# Patient Record
Sex: Female | Born: 1935
Health system: Southern US, Community
[De-identification: ages and names within clinical notes are randomized; demographics above are authoritative.]

## PROBLEM LIST (undated history)

## (undated) DIAGNOSIS — I10 Essential (primary) hypertension: Secondary | ICD-10-CM

## (undated) DIAGNOSIS — M1990 Unspecified osteoarthritis, unspecified site: Secondary | ICD-10-CM

## (undated) DIAGNOSIS — R0600 Dyspnea, unspecified: Secondary | ICD-10-CM

## (undated) DIAGNOSIS — R933 Abnormal findings on diagnostic imaging of other parts of digestive tract: Secondary | ICD-10-CM

## (undated) DIAGNOSIS — K219 Gastro-esophageal reflux disease without esophagitis: Secondary | ICD-10-CM

## (undated) DIAGNOSIS — R1310 Dysphagia, unspecified: Secondary | ICD-10-CM

## (undated) DIAGNOSIS — N390 Urinary tract infection, site not specified: Secondary | ICD-10-CM

## (undated) DIAGNOSIS — E559 Vitamin D deficiency, unspecified: Secondary | ICD-10-CM

## (undated) DIAGNOSIS — D649 Anemia, unspecified: Secondary | ICD-10-CM

## (undated) DIAGNOSIS — R748 Abnormal levels of other serum enzymes: Secondary | ICD-10-CM

## (undated) DIAGNOSIS — R112 Nausea with vomiting, unspecified: Secondary | ICD-10-CM

## (undated) DIAGNOSIS — Z9889 Other specified postprocedural states: Secondary | ICD-10-CM

## (undated) DIAGNOSIS — C801 Malignant (primary) neoplasm, unspecified: Secondary | ICD-10-CM

## (undated) DIAGNOSIS — R1319 Other dysphagia: Secondary | ICD-10-CM

## (undated) DIAGNOSIS — I2699 Other pulmonary embolism without acute cor pulmonale: Secondary | ICD-10-CM

## (undated) DIAGNOSIS — K222 Esophageal obstruction: Secondary | ICD-10-CM

## (undated) DIAGNOSIS — E041 Nontoxic single thyroid nodule: Secondary | ICD-10-CM

## (undated) DIAGNOSIS — K5790 Diverticulosis of intestine, part unspecified, without perforation or abscess without bleeding: Secondary | ICD-10-CM

## (undated) DIAGNOSIS — I868 Varicose veins of other specified sites: Secondary | ICD-10-CM

## (undated) DIAGNOSIS — I8390 Asymptomatic varicose veins of unspecified lower extremity: Secondary | ICD-10-CM

## (undated) HISTORY — DX: Abnormal levels of other serum enzymes: R74.8

## (undated) HISTORY — DX: Gastro-esophageal reflux disease without esophagitis: K21.9

## (undated) HISTORY — DX: Vitamin D deficiency, unspecified: E55.9

## (undated) HISTORY — DX: Essential (primary) hypertension: I10

## (undated) HISTORY — PX: ERCP: SHX60

## (undated) HISTORY — DX: Nontoxic single thyroid nodule: E04.1

## (undated) HISTORY — DX: Asymptomatic varicose veins of unspecified lower extremity: I83.90

## (undated) HISTORY — PX: TUBAL LIGATION: SHX77

## (undated) HISTORY — DX: Varicose veins of other specified sites: I86.8

## (undated) HISTORY — DX: Hypocalcemia: E83.51

## (undated) HISTORY — DX: Diverticulosis of intestine, part unspecified, without perforation or abscess without bleeding: K57.90

## (undated) HISTORY — DX: Other dysphagia: R13.19

## (undated) HISTORY — DX: Malignant (primary) neoplasm, unspecified: C80.1

## (undated) HISTORY — DX: Esophageal obstruction: K22.2

## (undated) HISTORY — PX: CATARACT EXTRACTION: SUR2

## (undated) HISTORY — PX: ABDOMINAL HYSTERECTOMY: SHX81

## (undated) HISTORY — DX: Dysphagia, unspecified: R13.10

## (undated) HISTORY — DX: Anemia, unspecified: D64.9

## (undated) HISTORY — PX: CHOLECYSTECTOMY: SHX55

## (undated) HISTORY — PX: KNEE ARTHROSCOPY: SUR90

## (undated) HISTORY — DX: Abnormal findings on diagnostic imaging of other parts of digestive tract: R93.3

---

## 1998-06-05 ENCOUNTER — Ambulatory Visit (HOSPITAL_COMMUNITY): Admission: RE | Admit: 1998-06-05 | Discharge: 1998-06-05 | Payer: Self-pay | Admitting: Pulmonary Disease

## 1998-06-05 ENCOUNTER — Encounter: Payer: Self-pay | Admitting: Pulmonary Disease

## 1998-06-27 ENCOUNTER — Ambulatory Visit: Admission: RE | Admit: 1998-06-27 | Discharge: 1998-06-27 | Payer: Self-pay | Admitting: Pulmonary Disease

## 1998-08-13 ENCOUNTER — Encounter: Payer: Self-pay | Admitting: Pulmonary Disease

## 1998-08-13 ENCOUNTER — Ambulatory Visit (HOSPITAL_COMMUNITY): Admission: RE | Admit: 1998-08-13 | Discharge: 1998-08-13 | Payer: Self-pay | Admitting: Pulmonary Disease

## 2002-02-20 ENCOUNTER — Ambulatory Visit (HOSPITAL_COMMUNITY): Admission: RE | Admit: 2002-02-20 | Discharge: 2002-02-20 | Payer: Self-pay | Admitting: Gastroenterology

## 2002-02-20 ENCOUNTER — Encounter: Payer: Self-pay | Admitting: Gastroenterology

## 2003-01-10 ENCOUNTER — Encounter: Payer: Self-pay | Admitting: Emergency Medicine

## 2003-01-11 ENCOUNTER — Encounter: Payer: Self-pay | Admitting: Emergency Medicine

## 2003-01-11 ENCOUNTER — Inpatient Hospital Stay (HOSPITAL_COMMUNITY): Admission: EM | Admit: 2003-01-11 | Discharge: 2003-01-13 | Payer: Self-pay | Admitting: Emergency Medicine

## 2003-01-11 ENCOUNTER — Encounter: Payer: Self-pay | Admitting: Internal Medicine

## 2003-01-12 ENCOUNTER — Encounter (INDEPENDENT_AMBULATORY_CARE_PROVIDER_SITE_OTHER): Payer: Self-pay | Admitting: Specialist

## 2003-01-12 ENCOUNTER — Encounter: Payer: Self-pay | Admitting: General Surgery

## 2003-02-26 ENCOUNTER — Ambulatory Visit: Admission: RE | Admit: 2003-02-26 | Discharge: 2003-02-26 | Payer: Self-pay | Admitting: Pulmonary Disease

## 2007-07-20 ENCOUNTER — Other Ambulatory Visit: Admission: RE | Admit: 2007-07-20 | Discharge: 2007-07-20 | Payer: Self-pay | Admitting: Family Medicine

## 2009-01-01 ENCOUNTER — Encounter (INDEPENDENT_AMBULATORY_CARE_PROVIDER_SITE_OTHER): Payer: Self-pay | Admitting: *Deleted

## 2010-09-04 NOTE — Consult Note (Signed)
[name], [name]                          ACCOUNT NO.:  [id number]   MEDICAL RECORD NO.:  [id number]                   PATIENT TYPE:  INP   LOCATION:  5025                                 FACILITY:  [facility]   PHYSICIAN:  Abigail Miyamoto, M.D.              DATE OF BIRTH:  Jan 30, 1935   DATE OF CONSULTATION:  01/11/2003  DATE OF DISCHARGE:                                   CONSULTATION   CHIEF COMPLAINT:  Common bile duct stones.   HISTORY:  This is a pleasant 76 year old female who was admitted on the  January 11, 2003 with abdominal pain and pain radiating through her back,  nausea and vomiting, and symptoms consistent with biliary colic.  She has  had similar attacks several years in the past and had __________ in the  folds in the gallbladder and ERCP, which was otherwise unremarkable last  year.  Secondary to her persistent pain and elevated bilirubin, at this  time, she was admitted again.  Ultrasound showed no stones in the bile duct  but questionable mild dilation of the biliary tree.  Her bilirubin was  elevated at 2.9.  She is otherwise healthy.  Has had no other complaints.   PAST MEDICAL HISTORY:  Is significant for hypertension, for which she is on  a hypertensive medication.   PAST SURGICAL HISTORY:  Includes hysterectomy and tubal ligation.   ALLERGIES:  She has allergies to CODEINE and SULFA.   SOCIAL HISTORY:  She does not smoke, does not drink alcohol.   REVIEW OF SYSTEMS:  Negative from a cardiopulmonary standpoint.  She is also  passing her urine well.   PHYSICAL EXAMINATION:  GENERAL:  Reveals an obese female in no acute  distress.  VITAL SIGNS:  Temperature is 100; pulse is 71; blood pressure is 142/69;  respiratory rate is 20.  GENERAL:  She is well in appearance other than mildly sedated after the  ERCP.  EYES:  She is anicteric.  Pupils are reactive bilaterally.  EARS, NOSE, MOUTH & THROAT:  External ears and nose are normal.  Hearing is  normal.  Oropharynx is clear.  NECK:  Supple.  There is no cervical adenopathy or thyromegaly.  LUNGS:  Clear to auscultation bilaterally with normal effort.  CARDIOVASCULAR:  Regular rate and rhythm with no peripheral edema, no  murmurs.  ABDOMEN:  Obese.  There is some tenderness in the epigastrium.  There is a  well-healed incision at the umbilicus.  There are no masses.  There is no  organomegaly.  There are no hernias.  EXTREMITIES:  Are warm and well perfused.   LABORATORY DATA:  From today, shows her to have a white blood count of 9.8,  hemoglobin 11.7; creatinine 0.8; bilirubin is down to 1.4; alkaline  phosphatase is elevated at 281; AST and ALT are elevated at 144 and 186;  amylase and lipase are normal.   ASSESSMENT/PLAN:  This is a 76 year old female with biliary colic and common  bile duct stones, status post endoscopic retrograde cholangiopancreatography  today and retraction of the stones with sphincterotomy.  At this  point, cholecystectomy is now warranted.  I discussed the laparoscopic  approach with the patient and her family.  We discussed the risks of the  surgery, including bleeding, infection, common bile duct injury, need for  open surgery, bile leak, etc., and, at this point, they wish to proceed.  Surgery will thus be planned for tomorrow.                                               Abigail Miyamoto, M.D.    DB/MEDQ  D:  01/11/2003  T:  01/13/2003  Job:  [id number]   cc:   Arlyce Dice, M.D.

## 2010-09-04 NOTE — Op Note (Signed)
NAMESUMMIT, BORCHARDT                          ACCOUNT NO.:  [id number]   MEDICAL RECORD NO.:  [id number]                   PATIENT TYPE:  OUT   LOCATION:  CARD                                 FACILITY:  [facility]   PHYSICIAN:  Oley Balm. Sung Amabile, M.D. [facility]          DATE OF BIRTH:  02-17-1935   DATE OF PROCEDURE:  02/26/2003  DATE OF DISCHARGE:  02/26/2003                                 OPERATIVE REPORT   PROCEDURE:  Cardiopulmonary stress test.   INDICATIONS:  Unexplained dyspnea.   DESCRIPTION OF PROCEDURE:  Cardiopulmonary stress testing was performed on a  graded treadmill.  A modified Bruce protocol was used.  Testing was stopped  due to dyspnea and heart rate goal after it was maximal.  At peak exercise  VO2 maximum was 1.31 L/min., or 78% of predicted, maximum indicating mild  exercise impairment.  When corrected for her body weight, VO2 over body  weight was 61% of predicted maximum, indicating a mild to moderate  impairment.   At peak exercise heart rate was 158 beats per minute or 101% of predicted  maximum, indicating that the cardiovascular limitation was reached.  Oxygen  pulse was normal, suggesting normal stroke volume.  Blood pressure response  revealed a mild hypertensive response to exercise.  EKG tracings revealed no  ischemia and no arrhythmias.   At peak exercise minute ventilation was 62.4 L or 90% of predicted maximum,  indicating that her ventilatory limitation was reached.  Gas exchange  parameters revealed borderline increased dead space ventilation.  Baseline  pulmonary function tests revealed mild restriction.  Post-exercise  spirometry was not done.   SUMMARY:  Mild to moderate exercise impairment due to a simultaneous  cardiovascular and ventilatory limitation without discrete evidence of  cardiac or pulmonary pathology.  However, there does appear to be a mild  hypertensive response to exercise and borderline increase in dead space  ventilation.  Most  likely cause of exercise limitation is deconditioning and  obesity.  However, would consider echocardiogram to rule out left  ventricular dysfunction or hypertrophy.                                               Oley Balm Sung Amabile, M.D. [facility]    DBS/MEDQ  D:  03/06/2003  T:  03/07/2003  Job:  [id number]   cc:   Cardiopulmonary Laboratory, [facility]

## 2010-09-04 NOTE — Op Note (Signed)
[name], [name]                          ACCOUNT NO.:  [id number]   MEDICAL RECORD NO.:  [id number]                   PATIENT TYPE:  INP   LOCATION:  5025                                 FACILITY:  [facility]   PHYSICIAN:  Ollen Gross. Vernell Morgans, M.D.              DATE OF BIRTH:  01-Dec-1934   DATE OF PROCEDURE:  DATE OF DISCHARGE:                                 OPERATIVE REPORT   PREOPERATIVE DIAGNOSIS:  Gallstones.   POSTOPERATIVE DIAGNOSIS:  Gallstones.   PROCEDURE:  Laparoscopic cholecystectomy with intraoperative cholangiogram.   SURGEON:  Ollen Gross. Carolynne Edouard, M.D.   ASSISTANT:  Adolph Pollack, M.D.   ANESTHESIA:  General endotracheal.   DESCRIPTION OF PROCEDURE:  After informed consent was obtained, the patient  was brought to the operating room and placed in the supine position on the  operating room table.  After adequate induction of general anesthesia, the  patient's abdomen was prepped with Betadine and draped in the usual sterile  manner.  The area above the umbilicus as infiltrated with 0.25% Marcaine and  a small incision was made with a 15 blade knife.  This incision was carried  down through the subcutaneous tissue bluntly with a Kelly clamp and [employer]-  [employer] retractors until the linea alba was identified.  The linea alba was  incised with a 15 blade knife and each side was grasped with Kocher clamps.  The preperitoneal space was then elevated anteriorly.  The preperitoneal  space was then probed bluntly with a hemostat until the peritoneum was  opened and access was gained to the abdominal cavity.  A 0 Vicryl  pursestring stitch was then placed in the fascia surrounding the opening.  A  Hasson cannula was placed through the opening with the previously-placed  Vicryl pursestring stitch.  The abdomen was then insufflated with carbon  dioxide without difficulty.  The patient was placed in a head-up position  and the laparoscope was placed through the Hasson cannula and  the right  upper quadrant was inspected.  The liver and dome of the gallbladder were  readily identified.  The epigastric region was then infiltrated with 0.25%  Marcaine and a small incision was made with a 15 blade knife, and a 10 mm  port was placed bluntly through this incision into the abdominal cavity  under direct vision.  Sites were then chosen laterally on the right side of  the abdomen for placement of 5 mm ports.  Each of these areas was  infiltrated with 0.25% Marcaine.  Small stab incisions were made with a 15  blade knife.  Five millimeters were then placed bluntly through these  incisions into the abdominal cavity under direct vision.  A blunt grasper  was placed through the lateralmost 5 mm port and used to grasp the dome of  the gallbladder and elevate it anteriorly and superiorly.  Another blunt  grasper was placed  through the other 5 mm port and used to retract on the  body and neck of the gallbladder.  A dissector was placed through the  epigastric port and using the combination of electrocautery and blunt  dissection, some adhesions to the gallbladder body were taken down.  The  peritoneal reflection at the gallbladder neck was then opened with the  electrocautery.  Blunt dissection was then carried out in this area until  the gallbladder neck-cystic duct junction was readily identified and a good  window was identified.  A clip was placed on the gallbladder neck.  A small  ductotomy was made.  A 14-gauge Angiocath was placed percutaneously through  the anterior abdominal wall and a Reddick cholangiogram catheter was placed  through the Angiocath and flushed.  The Reddick catheter was then placed  within the cystic duct and anchored in place with a clip.  A cholangiogram  was obtained that showed dilated ducts but no filling defects and good  emptying into the duodenum.  There was adequate length on the cystic duct.  The anchoring clip and catheters then were removed  from the patient, three  clips were placed proximally on the cystic duct, and the duct was tied  between the two sets of clips.  Posterior to this the cystic artery with a  branch was identified and again dissected in a circumferential manner  bluntly until a good window was created.  Two clips were placed proximally  and one distally on each of the arteries, and the arteries were divided  between the two.  Next a laparoscopic hook cautery device was used to  separate the gallbladder from the liver bed.  Prior to completely detaching  the gallbladder from the liver bed, the liver bed was inspected and several  small bleeding points were coagulated with the electrocautery.  The  gallbladder was then detached the rest of the way from the liver bed without  difficulty.  The laparoscope was then moved to the upper midline port and a  gallbladder grasper was placed through the Hasson cannula.  The gallbladder  neck was grasped and the gallbladder was removed with the Hasson cannula  through the supraumbilical port without difficulty.  The fascial defect was  closed with the previously-placed Vicryl pursestring stitch.  The abdomen  was then irrigated with copious amounts of saline until the effluent was  clear.  The liver bed was inspected and again found to be hemostatic.  The  ports were then all removed under direct vision and found to be hemostatic.  Gas was allowed to escape.  The skin incisions were then all closed with  interrupted 4-0 Monocryl subcuticular stitches, Benzoin and Steri-Strips  were applied.  The patient tolerated the procedure well.  At the end of the  case all needle, sponge, and instrument counts were correct.  The patient  was then awakened and taken to the recovery room in stable condition.                                               Ollen Gross. Vernell Morgans, M.D.    PST/MEDQ  D:  01/12/2003  T:  01/14/2003  Job:  [id number]
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
## 2010-10-13 ENCOUNTER — Encounter: Payer: Self-pay | Admitting: Vascular Surgery

## 2011-04-27 DIAGNOSIS — L570 Actinic keratosis: Secondary | ICD-10-CM | POA: Insufficient documentation

## 2011-04-27 DIAGNOSIS — Z85820 Personal history of malignant melanoma of skin: Secondary | ICD-10-CM

## 2011-04-27 HISTORY — DX: Actinic keratosis: L57.0

## 2011-04-27 HISTORY — DX: Personal history of malignant melanoma of skin: Z85.820

## 2011-06-15 ENCOUNTER — Encounter: Payer: Self-pay | Admitting: *Deleted

## 2011-06-15 ENCOUNTER — Encounter: Payer: Self-pay | Admitting: Cardiothoracic Surgery

## 2011-06-15 ENCOUNTER — Other Ambulatory Visit: Payer: Self-pay | Admitting: Family Medicine

## 2011-06-15 ENCOUNTER — Ambulatory Visit
Admission: RE | Admit: 2011-06-15 | Discharge: 2011-06-15 | Disposition: A | Discharge status: Home / Self Care | Payer: Medicare Other | Source: Ambulatory Visit | Attending: Family Medicine | Admitting: Family Medicine

## 2011-06-15 ENCOUNTER — Institutional Professional Consult (permissible substitution) (INDEPENDENT_AMBULATORY_CARE_PROVIDER_SITE_OTHER): Payer: Medicare Other | Admitting: Cardiothoracic Surgery

## 2011-06-15 VITALS — BP 124/76 | HR 92 | Resp 16 | Ht 62.0 in | Wt 198.0 lb

## 2011-06-15 DIAGNOSIS — K219 Gastro-esophageal reflux disease without esophagitis: Secondary | ICD-10-CM | POA: Insufficient documentation

## 2011-06-15 DIAGNOSIS — E559 Vitamin D deficiency, unspecified: Secondary | ICD-10-CM | POA: Insufficient documentation

## 2011-06-15 DIAGNOSIS — J942 Hemothorax: Secondary | ICD-10-CM

## 2011-06-15 DIAGNOSIS — S2249XA Multiple fractures of ribs, unspecified side, initial encounter for closed fracture: Secondary | ICD-10-CM

## 2011-06-15 DIAGNOSIS — S2242XA Multiple fractures of ribs, left side, initial encounter for closed fracture: Secondary | ICD-10-CM

## 2011-06-15 DIAGNOSIS — C801 Malignant (primary) neoplasm, unspecified: Secondary | ICD-10-CM | POA: Insufficient documentation

## 2011-06-15 DIAGNOSIS — J90 Pleural effusion, not elsewhere classified: Secondary | ICD-10-CM

## 2011-06-15 DIAGNOSIS — I868 Varicose veins of other specified sites: Secondary | ICD-10-CM | POA: Insufficient documentation

## 2011-06-15 DIAGNOSIS — I10 Essential (primary) hypertension: Secondary | ICD-10-CM | POA: Insufficient documentation

## 2011-06-15 DIAGNOSIS — I8390 Asymptomatic varicose veins of unspecified lower extremity: Secondary | ICD-10-CM | POA: Insufficient documentation
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
## 2011-06-15 NOTE — Patient Instructions (Signed)
Inspiration maneuvers hourly No driving or lifting Take temp each day 4pm Call for temp>101 or short of breath

## 2011-06-18 DIAGNOSIS — I2699 Other pulmonary embolism without acute cor pulmonale: Secondary | ICD-10-CM

## 2011-06-18 DIAGNOSIS — Z86711 Personal history of pulmonary embolism: Secondary | ICD-10-CM

## 2011-06-18 HISTORY — DX: Personal history of pulmonary embolism: Z86.711

## 2011-06-18 HISTORY — DX: Other pulmonary embolism without acute cor pulmonale: I26.99

## 2011-06-20 ENCOUNTER — Other Ambulatory Visit: Payer: Self-pay | Admitting: Cardiothoracic Surgery

## 2011-06-22 ENCOUNTER — Other Ambulatory Visit: Payer: Self-pay | Admitting: Cardiothoracic Surgery

## 2011-06-22 DIAGNOSIS — S2249XA Multiple fractures of ribs, unspecified side, initial encounter for closed fracture: Secondary | ICD-10-CM

## 2011-06-23 ENCOUNTER — Encounter (HOSPITAL_COMMUNITY): Payer: Self-pay | Admitting: Emergency Medicine

## 2011-06-23 ENCOUNTER — Other Ambulatory Visit: Payer: Self-pay

## 2011-06-23 ENCOUNTER — Encounter: Payer: Self-pay | Admitting: Cardiothoracic Surgery

## 2011-06-23 ENCOUNTER — Ambulatory Visit (INDEPENDENT_AMBULATORY_CARE_PROVIDER_SITE_OTHER): Payer: Medicare Other | Admitting: Cardiothoracic Surgery

## 2011-06-23 ENCOUNTER — Inpatient Hospital Stay (HOSPITAL_COMMUNITY)
Admission: EM | Admit: 2011-06-23 | Discharge: 2011-06-29 | DRG: 176 | Disposition: A | Discharge status: Home / Self Care | Payer: Medicare Other | Source: Ambulatory Visit | Attending: Internal Medicine | Admitting: Internal Medicine

## 2011-06-23 ENCOUNTER — Ambulatory Visit
Admission: RE | Admit: 2011-06-23 | Discharge: 2011-06-23 | Disposition: A | Discharge status: Home / Self Care | Payer: Medicare Other | Source: Ambulatory Visit | Attending: Cardiothoracic Surgery | Admitting: Cardiothoracic Surgery

## 2011-06-23 ENCOUNTER — Ambulatory Visit (HOSPITAL_COMMUNITY)
Admission: RE | Admit: 2011-06-23 | Discharge: 2011-06-23 | Disposition: A | Discharge status: Home / Self Care | Payer: Medicare Other | Source: Ambulatory Visit | Attending: Cardiothoracic Surgery | Admitting: Cardiothoracic Surgery

## 2011-06-23 VITALS — BP 135/77 | HR 87 | Resp 20 | Ht 62.0 in | Wt 200.0 lb

## 2011-06-23 DIAGNOSIS — R0902 Hypoxemia: Secondary | ICD-10-CM | POA: Diagnosis present

## 2011-06-23 DIAGNOSIS — R0602 Shortness of breath: Secondary | ICD-10-CM

## 2011-06-23 DIAGNOSIS — C801 Malignant (primary) neoplasm, unspecified: Secondary | ICD-10-CM

## 2011-06-23 DIAGNOSIS — Z882 Allergy status to sulfonamides status: Secondary | ICD-10-CM

## 2011-06-23 DIAGNOSIS — S2239XA Fracture of one rib, unspecified side, initial encounter for closed fracture: Secondary | ICD-10-CM

## 2011-06-23 DIAGNOSIS — I8390 Asymptomatic varicose veins of unspecified lower extremity: Secondary | ICD-10-CM

## 2011-06-23 DIAGNOSIS — I2699 Other pulmonary embolism without acute cor pulmonale: Principal | ICD-10-CM | POA: Diagnosis present

## 2011-06-23 DIAGNOSIS — K219 Gastro-esophageal reflux disease without esophagitis: Secondary | ICD-10-CM | POA: Diagnosis present

## 2011-06-23 DIAGNOSIS — Z86711 Personal history of pulmonary embolism: Secondary | ICD-10-CM | POA: Diagnosis present

## 2011-06-23 DIAGNOSIS — S2249XA Multiple fractures of ribs, unspecified side, initial encounter for closed fracture: Secondary | ICD-10-CM

## 2011-06-23 DIAGNOSIS — J90 Pleural effusion, not elsewhere classified: Secondary | ICD-10-CM

## 2011-06-23 DIAGNOSIS — Z881 Allergy status to other antibiotic agents status: Secondary | ICD-10-CM

## 2011-06-23 DIAGNOSIS — Z139 Encounter for screening, unspecified: Secondary | ICD-10-CM

## 2011-06-23 DIAGNOSIS — I10 Essential (primary) hypertension: Secondary | ICD-10-CM | POA: Diagnosis present

## 2011-06-23 HISTORY — DX: Other pulmonary embolism without acute cor pulmonale: I26.99

## 2011-06-23 LAB — CBC
Hemoglobin: 10.3 g/dL — ABNORMAL LOW (ref 12.0–15.0)
Platelets: 292 10*3/uL (ref 150–400)
RBC: 4.07 MIL/uL (ref 3.87–5.11)
RDW: 15.5 % (ref 11.5–15.5)

## 2011-06-23 LAB — BUN: BUN: 21 mg/dL (ref 6–23)

## 2011-06-23 LAB — POCT I-STAT TROPONIN I

## 2011-06-23 LAB — CREATININE, SERUM: Creat: 0.82 mg/dL (ref 0.50–1.10)

## 2011-06-23 LAB — PROTIME-INR
INR: 1.01 (ref 0.00–1.49)
Prothrombin Time: 13.5 seconds (ref 11.6–15.2)

## 2011-06-23 LAB — DIFFERENTIAL
Basophils Absolute: 0 10*3/uL (ref 0.0–0.1)
Lymphocytes Relative: 15 % (ref 12–46)
Neutro Abs: 5.3 10*3/uL (ref 1.7–7.7)

## 2011-06-23 LAB — BASIC METABOLIC PANEL
CO2: 23 mEq/L (ref 19–32)
Chloride: 99 mEq/L (ref 96–112)
Potassium: 3.5 mEq/L (ref 3.5–5.1)
Sodium: 135 mEq/L (ref 135–145)

## 2011-06-23 MED ORDER — HEPARIN (PORCINE) IN NACL 100-0.45 UNIT/ML-% IJ SOLN
1200.0000 [IU]/h | INTRAMUSCULAR | Status: DC
  Administered 2011-06-23: 1200 [IU]/h via INTRAVENOUS
  Filled 2011-06-23 (×2): qty 250

## 2011-06-23 MED ORDER — MORPHINE SULFATE 4 MG/ML IJ SOLN
4.0000 mg | Freq: Once | INTRAMUSCULAR | Status: AC
  Administered 2011-06-24: 4 mg via INTRAVENOUS
  Filled 2011-06-23: qty 1

## 2011-06-23 MED ORDER — HEPARIN (PORCINE) IN NACL 100-0.45 UNIT/ML-% IJ SOLN
16.0000 [IU]/kg/h | INTRAMUSCULAR | Status: DC
  Administered 2011-06-23: 16 [IU]/kg/h via INTRAVENOUS
  Filled 2011-06-23: qty 250

## 2011-06-23 MED ORDER — ONDANSETRON HCL 4 MG/2ML IJ SOLN
4.0000 mg | Freq: Once | INTRAMUSCULAR | Status: AC
  Administered 2011-06-24: 4 mg via INTRAVENOUS
  Filled 2011-06-23: qty 2

## 2011-06-23 MED ORDER — HEPARIN BOLUS VIA INFUSION
4000.0000 [IU] | Freq: Once | INTRAVENOUS | Status: AC
  Administered 2011-06-23: 4000 [IU] via INTRAVENOUS

## 2011-06-23 MED ORDER — IOHEXOL 350 MG/ML SOLN
60.0000 mL | Freq: Once | INTRAVENOUS | Status: AC | PRN
  Administered 2011-06-23: 60 mL via INTRAVENOUS
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
## 2011-06-23 NOTE — ED Provider Notes (Signed)
I saw and evaluated the patient, reviewed the resident's note and I agree with the findings and plan. Patient's x-rays and labs reviewed. She has pulmonary embolism. Patient started on heparin will be admitted by internal medicine  Toy Baker, MD 06/23/11 [phone]
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
## 2011-06-23 NOTE — ED Notes (Signed)
Patient states she is feeling much better at this time, as long as she does not move.

## 2011-06-24 ENCOUNTER — Inpatient Hospital Stay (HOSPITAL_COMMUNITY): Payer: Medicare Other

## 2011-06-24 ENCOUNTER — Encounter (HOSPITAL_COMMUNITY): Payer: Self-pay | Admitting: Internal Medicine

## 2011-06-24 DIAGNOSIS — I2699 Other pulmonary embolism without acute cor pulmonale: Secondary | ICD-10-CM

## 2011-06-24 LAB — CBC
HCT: 30 % — ABNORMAL LOW (ref 36.0–46.0)
Platelets: 284 10*3/uL (ref 150–400)
RDW: 15.6 % — ABNORMAL HIGH (ref 11.5–15.5)
WBC: 6.5 10*3/uL (ref 4.0–10.5)

## 2011-06-24 LAB — BASIC METABOLIC PANEL
Calcium: 8.3 mg/dL — ABNORMAL LOW (ref 8.4–10.5)
Chloride: 102 mEq/L (ref 96–112)
Creatinine, Ser: 0.73 mg/dL (ref 0.50–1.10)
GFR calc Af Amer: >90 mL/min (ref >90)
GFR calc non Af Amer: 82 mL/min — ABNORMAL LOW (ref >90)

## 2011-06-24 LAB — HEPARIN LEVEL (UNFRACTIONATED)
Heparin Unfractionated: 0.7 IU/mL (ref 0.30–0.70)
Heparin Unfractionated: 0.63 IU/mL (ref 0.30–0.70)

## 2011-06-24 MED ORDER — COUMADIN BOOK
Freq: Once | Status: AC
Start: 2011-06-24 — End: 2011-06-24
  Administered 2011-06-24: 1
  Filled 2011-06-24: qty 1

## 2011-06-24 MED ORDER — ACETAMINOPHEN 325 MG PO TABS
650.0000 mg | ORAL_TABLET | Freq: Four times a day (QID) | ORAL | Status: DC | PRN
  Administered 2011-06-25 – 2011-06-29 (×6): 650 mg via ORAL
  Filled 2011-06-24 (×5): qty 2

## 2011-06-24 MED ORDER — SENNA 8.6 MG PO TABS
1.0000 | ORAL_TABLET | Freq: Two times a day (BID) | ORAL | Status: DC
  Administered 2011-06-24 – 2011-06-28 (×7): 8.6 mg via ORAL
  Filled 2011-06-24 (×12): qty 1

## 2011-06-24 MED ORDER — SODIUM CHLORIDE 0.9 % IJ SOLN
3.0000 mL | Freq: Two times a day (BID) | INTRAMUSCULAR | Status: DC
  Administered 2011-06-24 – 2011-06-28 (×10): 3 mL via INTRAVENOUS

## 2011-06-24 MED ORDER — WARFARIN - PHARMACIST DOSING INPATIENT
Freq: Every day | Status: DC
  Administered 2011-06-28: 18:00:00

## 2011-06-24 MED ORDER — DOCUSATE SODIUM 100 MG PO CAPS
100.0000 mg | ORAL_CAPSULE | Freq: Two times a day (BID) | ORAL | Status: DC
  Administered 2011-06-24 – 2011-06-28 (×7): 100 mg via ORAL
  Filled 2011-06-24 (×11): qty 1

## 2011-06-24 MED ORDER — WARFARIN SODIUM 7.5 MG PO TABS
7.5000 mg | ORAL_TABLET | Freq: Once | ORAL | Status: AC
  Administered 2011-06-24: 7.5 mg via ORAL
  Filled 2011-06-24: qty 1

## 2011-06-24 MED ORDER — ASPIRIN EC 81 MG PO TBEC
81.0000 mg | DELAYED_RELEASE_TABLET | Freq: Every day | ORAL | Status: DC
  Administered 2011-06-24 – 2011-06-29 (×6): 81 mg via ORAL
  Filled 2011-06-24 (×6): qty 1

## 2011-06-24 MED ORDER — ONDANSETRON HCL 4 MG PO TABS
4.0000 mg | ORAL_TABLET | Freq: Four times a day (QID) | ORAL | Status: DC | PRN
  Administered 2011-06-26 – 2011-06-28 (×3): 4 mg via ORAL
  Filled 2011-06-24 (×3): qty 1

## 2011-06-24 MED ORDER — ONDANSETRON HCL 4 MG/2ML IJ SOLN
4.0000 mg | Freq: Four times a day (QID) | INTRAMUSCULAR | Status: DC | PRN
  Administered 2011-06-27 – 2011-06-28 (×2): 4 mg via INTRAVENOUS
  Filled 2011-06-24 (×2): qty 2

## 2011-06-24 MED ORDER — MORPHINE SULFATE 2 MG/ML IJ SOLN
1.0000 mg | INTRAMUSCULAR | Status: DC | PRN
  Administered 2011-06-24 – 2011-06-28 (×5): 1 mg via INTRAVENOUS
  Filled 2011-06-24 (×6): qty 1

## 2011-06-24 MED ORDER — WARFARIN VIDEO
Freq: Once | Status: AC
  Administered 2011-06-24: 12:00:00

## 2011-06-24 MED ORDER — AMLODIPINE BESYLATE 10 MG PO TABS
10.0000 mg | ORAL_TABLET | Freq: Every day | ORAL | Status: DC
  Administered 2011-06-24 – 2011-06-29 (×6): 10 mg via ORAL
  Filled 2011-06-24 (×6): qty 1

## 2011-06-24 MED ORDER — ENOXAPARIN SODIUM 100 MG/ML ~~LOC~~ SOLN
90.0000 mg | Freq: Two times a day (BID) | SUBCUTANEOUS | Status: DC
  Administered 2011-06-24: 90 mg via SUBCUTANEOUS
  Administered 2011-06-25 (×2): via SUBCUTANEOUS
  Administered 2011-06-26 – 2011-06-29 (×7): 90 mg via SUBCUTANEOUS
  Filled 2011-06-24 (×14): qty 1

## 2011-06-24 MED ORDER — HEPARIN (PORCINE) IN NACL 100-0.45 UNIT/ML-% IJ SOLN
1200.0000 [IU]/h | INTRAMUSCULAR | Status: DC
  Administered 2011-06-24 (×2): 1200 [IU]/h via INTRAVENOUS
  Filled 2011-06-24 (×3): qty 250

## 2011-06-24 MED ORDER — ACETAMINOPHEN 650 MG RE SUPP: 650.0000 mg | Freq: Four times a day (QID) | RECTAL | Status: DC | PRN

## 2011-06-24 NOTE — Progress Notes (Signed)
VASCULAR LAB PRELIMINARY  PRELIMINARY  PRELIMINARY  PRELIMINARY  Bilateral lower extremity venous duplex  completed.    Preliminary report:  Bilateral:  No evidence of DVT, superficial thrombosis, or Baker's Cyst.    Terance Hart, RVT 06/24/2011, 2:52 PM

## 2011-06-24 NOTE — ED Notes (Signed)
Attempted to call report, RN unable to come to phone at this time.  Will return call in 10 minutes.
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
## 2011-06-24 NOTE — Progress Notes (Signed)
I Have seen and examined patient, she states are shortness of breath is better, still with pain but better controlled. Will transition from a unfractionated heparin to Lovenox. Continue current management plan as per Dr. Oda Kilts plan discussed with patient and family at bedside and questions answered.
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
## 2011-06-24 NOTE — Progress Notes (Signed)
Physical Therapy Cancellation Note: order received, chart reviewed. Pt with Bil PE not yet on coumadin and awaiting doppler to r/o DVT before evaluation can be initiated. Will check back as time allows. Thanks Toney Sang, PT [phone]

## 2011-06-25 LAB — CBC
Hemoglobin: 9.8 g/dL — ABNORMAL LOW (ref 12.0–15.0)
MCH: 25.5 pg — ABNORMAL LOW (ref 26.0–34.0)
MCHC: 31.1 g/dL (ref 30.0–36.0)
MCV: 81.8 fL (ref 78.0–100.0)
Platelets: 308 10*3/uL (ref 150–400)

## 2011-06-25 LAB — PROTIME-INR: Prothrombin Time: 14.3 seconds (ref 11.6–15.2)

## 2011-06-25 MED ORDER — PANTOPRAZOLE SODIUM 40 MG PO TBEC
40.0000 mg | DELAYED_RELEASE_TABLET | Freq: Every day | ORAL | Status: DC
  Administered 2011-06-25 – 2011-06-27 (×3): 40 mg via ORAL
  Filled 2011-06-25 (×3): qty 1

## 2011-06-25 MED ORDER — OXYCODONE HCL 5 MG PO TABS
5.0000 mg | ORAL_TABLET | Freq: Four times a day (QID) | ORAL | Status: DC | PRN
  Administered 2011-06-26: 5 mg via ORAL
  Filled 2011-06-25: qty 1

## 2011-06-25 MED ORDER — WARFARIN SODIUM 7.5 MG PO TABS
7.5000 mg | ORAL_TABLET | Freq: Once | ORAL | Status: AC
  Administered 2011-06-25: 7.5 mg via ORAL
  Filled 2011-06-25: qty 1
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
## 2011-06-25 NOTE — ED Provider Notes (Signed)
I saw and evaluated the patient, reviewed the resident's note and I agree with the findings and plan.  Toy Baker, MD 06/25/11 [phone]

## 2011-06-25 NOTE — Progress Notes (Signed)
Ambulated x 2 thus far, due to ambulate again soon, second ambulation was without oxygen and is fine at rest but became quite SOB by the end of ambulation, will walk on oxygen with rolling walker, pain controlled today with plain acetaminophen 650mg  , states oxycodone makes her nauseated according to daughter, slight nosebleed with oxygen on coumadin/lovenox bridge, humidifier added to oxygen, coumadin education today with dietician and pharmacist as well as myself but husband remains concerned about effects of food on drug levels, emotional support given , will continue to reinforce education, Berle Mull RN
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
## 2011-06-26 ENCOUNTER — Inpatient Hospital Stay (HOSPITAL_COMMUNITY): Payer: Medicare Other

## 2011-06-26 LAB — PROTIME-INR: Prothrombin Time: 14.5 seconds (ref 11.6–15.2)

## 2011-06-26 MED ORDER — TRAMADOL HCL 50 MG PO TABS
50.0000 mg | ORAL_TABLET | Freq: Four times a day (QID) | ORAL | Status: DC | PRN
  Administered 2011-06-27 – 2011-06-28 (×2): 50 mg via ORAL
  Filled 2011-06-26 (×2): qty 1

## 2011-06-26 MED ORDER — WARFARIN SODIUM 7.5 MG PO TABS
7.5000 mg | ORAL_TABLET | Freq: Once | ORAL | Status: AC
  Administered 2011-06-26: 7.5 mg via ORAL
  Filled 2011-06-26: qty 1
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
## 2011-06-27 LAB — CBC
HCT: 33.2 % — ABNORMAL LOW (ref 36.0–46.0)
Hemoglobin: 10.4 g/dL — ABNORMAL LOW (ref 12.0–15.0)
MCV: 81.4 fL (ref 78.0–100.0)
RDW: 15.6 % — ABNORMAL HIGH (ref 11.5–15.5)
WBC: 6.5 10*3/uL (ref 4.0–10.5)

## 2011-06-27 LAB — PROTIME-INR: INR: 1.36 (ref 0.00–1.49)

## 2011-06-27 MED ORDER — WARFARIN SODIUM 10 MG PO TABS
10.0000 mg | ORAL_TABLET | Freq: Once | ORAL | Status: AC
  Administered 2011-06-27: 10 mg via ORAL
  Filled 2011-06-27: qty 1
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
## 2011-06-27 NOTE — Progress Notes (Signed)
Patient up to chair. C/o feeling "worse than I did yesterday." Patient says she just feels weak, a little dizzy and slightly nauseated. VSS, O 2 sat 94 % on 1 liter. Patient appears tired, color is good, chest sounds clear, somewhat diminished in bases. No cough. Abdomen soft with good bowel sounds. No edema. Encouraged incentive spirometry. Patient states she is unable to use I.S. as effectively today because she feels more short of breath. I encouraged patient to ambulate but she states she feels too nauseated. Prn given for nausea. Text page to MD. No new orders. Will monitor. Family at bedside. Lurline Idol [facility]
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
## 2011-06-28 LAB — URINE MICROSCOPIC-ADD ON

## 2011-06-28 LAB — URINALYSIS, ROUTINE W REFLEX MICROSCOPIC
Bilirubin Urine: NEGATIVE
Glucose, UA: 500 mg/dL — AB
Hgb urine dipstick: NEGATIVE
Specific Gravity, Urine: 1.026 (ref 1.005–1.030)
pH: 5.5 (ref 5.0–8.0)

## 2011-06-28 LAB — PROTIME-INR: INR: 1.74 — ABNORMAL HIGH (ref 0.00–1.49)

## 2011-06-28 LAB — CBC
HCT: 31 % — ABNORMAL LOW (ref 36.0–46.0)
Hemoglobin: 9.5 g/dL — ABNORMAL LOW (ref 12.0–15.0)
MCV: 81.4 fL (ref 78.0–100.0)
RDW: 15.5 % (ref 11.5–15.5)
WBC: 6.2 10*3/uL (ref 4.0–10.5)

## 2011-06-28 MED ORDER — PANTOPRAZOLE SODIUM 40 MG PO TBEC
40.0000 mg | DELAYED_RELEASE_TABLET | Freq: Two times a day (BID) | ORAL | Status: DC
  Administered 2011-06-28 – 2011-06-29 (×2): 40 mg via ORAL
  Filled 2011-06-28 (×2): qty 1

## 2011-06-28 MED ORDER — WARFARIN SODIUM 10 MG PO TABS
10.0000 mg | ORAL_TABLET | Freq: Once | ORAL | Status: AC
  Administered 2011-06-28: 10 mg via ORAL
  Filled 2011-06-28: qty 1

## 2011-06-28 MED ORDER — PANTOPRAZOLE SODIUM 40 MG PO TBEC
40.0000 mg | DELAYED_RELEASE_TABLET | ORAL | Status: AC
  Administered 2011-06-28: 40 mg via ORAL
  Filled 2011-06-28: qty 1

## 2011-06-28 MED ORDER — PROMETHAZINE HCL 25 MG/ML IJ SOLN
12.5000 mg | Freq: Four times a day (QID) | INTRAMUSCULAR | Status: DC | PRN
  Administered 2011-06-28: 12.5 mg via INTRAVENOUS
  Filled 2011-06-28: qty 1

## 2011-06-28 MED ORDER — PANTOPRAZOLE SODIUM 40 MG PO TBEC: 40.0000 mg | DELAYED_RELEASE_TABLET | Freq: Two times a day (BID) | ORAL | Status: DC

## 2011-06-28 MED ORDER — IBUPROFEN 400 MG PO TABS
400.0000 mg | ORAL_TABLET | Freq: Three times a day (TID) | ORAL | Status: DC | PRN
  Filled 2011-06-28 (×3): qty 1

## 2011-06-28 MED ORDER — KETOROLAC TROMETHAMINE 30 MG/ML IJ SOLN
30.0000 mg | Freq: Three times a day (TID) | INTRAMUSCULAR | Status: DC | PRN
  Filled 2011-06-28: qty 1

## 2011-06-28 NOTE — Progress Notes (Signed)
Utilization Review Completed.Wing Schoch T3/02/2012   
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
## 2011-06-29 LAB — PROTIME-INR: Prothrombin Time: 25.5 seconds — ABNORMAL HIGH (ref 11.6–15.2)

## 2011-06-29 LAB — URINE CULTURE
Colony Count: NO GROWTH
Culture  Setup Time: 201303112213
Culture: NO GROWTH

## 2011-06-29 LAB — BASIC METABOLIC PANEL
BUN: 8 mg/dL (ref 6–23)
CO2: 25 mEq/L (ref 19–32)
Chloride: 102 mEq/L (ref 96–112)
GFR calc Af Amer: >90 mL/min (ref >90)
Potassium: 3.7 mEq/L (ref 3.5–5.1)

## 2011-06-29 MED ORDER — WARFARIN SODIUM 5 MG PO TABS
7.5000 mg | ORAL_TABLET | Freq: Every day | ORAL | Status: DC
  Filled 2011-06-29: qty 1

## 2011-06-29 MED ORDER — ACETAMINOPHEN 325 MG PO TABS: 650.0000 mg | ORAL_TABLET | Freq: Four times a day (QID) | ORAL | Status: AC | PRN

## 2011-06-29 MED ORDER — WARFARIN SODIUM 7.5 MG PO TABS: 7.5000 mg | ORAL_TABLET | Freq: Every day | ORAL | Status: DC
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
## 2011-06-29 NOTE — Progress Notes (Signed)
1400 - Pt d/c home with instructions, r/x, and f/u appointments.  Pt and husband verbalized understanding of instructions, home with husband, HH set up for O2, escorted out by [facility] NT.  Ninetta Lights, RN

## 2011-06-29 NOTE — Progress Notes (Signed)
   CARE MANAGEMENT NOTE 06/29/2011  Patient:  [name]   Account Number:  [id number]  Date Initiated:  06/24/2011  Documentation initiated by:  Junius Creamer  Subjective/Objective Assessment:   adm w pul emboli     Action/Plan:   lives w husband, pcp dr Juluis Rainier   Anticipated DC Date:  06/27/2011   Anticipated DC Plan:  HOME/SELF CARE      DC Planning Services  CM consult  Follow-up appt scheduled      PAC Choice  DURABLE MEDICAL EQUIPMENT     DME arranged  [employer]      DME agency  [facility]        Status of service:  Completed, signed off  Discharge Disposition:  HOME/SELF CARE  Comments:  06/29/11 0900 Verdis Prime RN MSN CCM Appt arranged for PT/INR with Dr. Juluis Rainier for Thursday, 07/01/11, @ 9:00 a.m. 1200 RA O2 sat 85% with activity, pt will require home O2 - understands and agrees.  3/7 cm sec checked and copay for lovneox brand name is $164 and generic is $67.00 if needed. debbie dowell rn,bsn 3/7 having case magt sec check on copay for lovenox if needed at disch. debbie dowell rn,bsn [id number]

## 2011-06-29 NOTE — Progress Notes (Signed)
SATURATION QUALIFICATIONS:  Patient Saturations on Room Air at Rest = 94%  Patient Saturations on Room Air while Ambulating = 85%  Patient Saturations on 2 Liters of oxygen while Ambulating = 94%   
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
## 2011-07-07 ENCOUNTER — Ambulatory Visit (INDEPENDENT_AMBULATORY_CARE_PROVIDER_SITE_OTHER): Payer: Medicare Other | Admitting: Cardiothoracic Surgery

## 2011-07-07 ENCOUNTER — Encounter: Payer: Self-pay | Admitting: Cardiothoracic Surgery

## 2011-07-07 VITALS — BP 133/74 | HR 92 | Resp 16 | Ht 63.0 in | Wt 290.0 lb

## 2011-07-07 DIAGNOSIS — S2239XA Fracture of one rib, unspecified side, initial encounter for closed fracture: Secondary | ICD-10-CM

## 2011-07-07 DIAGNOSIS — S2249XA Multiple fractures of ribs, unspecified side, initial encounter for closed fracture: Secondary | ICD-10-CM

## 2011-07-07 DIAGNOSIS — I2699 Other pulmonary embolism without acute cor pulmonale: Secondary | ICD-10-CM

## 2011-07-07 NOTE — Patient Instructions (Signed)
U. may start driving U. should try to take a 20 minute walk 4-5 times every week Use the oxygen only at night
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
## 2011-07-28 ENCOUNTER — Encounter: Payer: Self-pay | Admitting: Cardiothoracic Surgery

## 2011-07-28 ENCOUNTER — Ambulatory Visit (INDEPENDENT_AMBULATORY_CARE_PROVIDER_SITE_OTHER): Payer: Medicare Other | Admitting: Cardiothoracic Surgery

## 2011-07-28 VITALS — BP 121/67 | HR 86 | Resp 20 | Ht 63.0 in | Wt 192.0 lb

## 2011-07-28 DIAGNOSIS — I2699 Other pulmonary embolism without acute cor pulmonale: Secondary | ICD-10-CM

## 2011-07-28 DIAGNOSIS — S2239XA Fracture of one rib, unspecified side, initial encounter for closed fracture: Secondary | ICD-10-CM
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
## 2011-09-08 ENCOUNTER — Other Ambulatory Visit: Payer: Self-pay | Admitting: Cardiothoracic Surgery

## 2011-09-08 DIAGNOSIS — J869 Pyothorax without fistula: Secondary | ICD-10-CM

## 2011-09-10 ENCOUNTER — Ambulatory Visit
Admission: RE | Admit: 2011-09-10 | Discharge: 2011-09-10 | Disposition: A | Discharge status: Home / Self Care | Payer: Medicare Other | Source: Ambulatory Visit | Attending: Cardiothoracic Surgery | Admitting: Cardiothoracic Surgery

## 2011-09-10 DIAGNOSIS — J869 Pyothorax without fistula: Secondary | ICD-10-CM

## 2011-09-15 ENCOUNTER — Ambulatory Visit: Payer: Medicare Other | Admitting: Cardiothoracic Surgery

## 2011-09-17 ENCOUNTER — Ambulatory Visit (INDEPENDENT_AMBULATORY_CARE_PROVIDER_SITE_OTHER): Payer: Medicare Other | Admitting: Cardiothoracic Surgery

## 2011-09-17 ENCOUNTER — Encounter: Payer: Self-pay | Admitting: Cardiothoracic Surgery

## 2011-09-17 VITALS — BP 117/68 | HR 76 | Resp 20 | Ht 63.0 in | Wt 194.0 lb

## 2011-09-17 DIAGNOSIS — S2239XA Fracture of one rib, unspecified side, initial encounter for closed fracture: Secondary | ICD-10-CM

## 2011-09-17 DIAGNOSIS — I2699 Other pulmonary embolism without acute cor pulmonale: Secondary | ICD-10-CM
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
## 2011-12-14 ENCOUNTER — Encounter: Payer: Self-pay | Admitting: Gastroenterology

## 2012-02-01 ENCOUNTER — Telehealth: Payer: Self-pay | Admitting: Gastroenterology

## 2012-02-02 NOTE — Telephone Encounter (Signed)
Patient is rescheduled to 02/11/12 11:00 with Willette Cluster RNP

## 2012-02-11 ENCOUNTER — Ambulatory Visit: Payer: Medicare Other | Admitting: Nurse Practitioner

## 2012-02-25 ENCOUNTER — Institutional Professional Consult (permissible substitution): Payer: Medicare Other | Admitting: Emergency Medicine

## 2012-03-06 ENCOUNTER — Ambulatory Visit (INDEPENDENT_AMBULATORY_CARE_PROVIDER_SITE_OTHER): Payer: PRIVATE HEALTH INSURANCE | Admitting: Emergency Medicine

## 2012-03-06 ENCOUNTER — Encounter: Payer: Self-pay | Admitting: Emergency Medicine

## 2012-03-06 ENCOUNTER — Ambulatory Visit: Payer: Medicare Other | Admitting: Gastroenterology

## 2012-03-06 VITALS — BP 100/58 | HR 63 | Temp 97.8°F | Ht 62.0 in | Wt 197.6 lb

## 2012-03-06 DIAGNOSIS — I2699 Other pulmonary embolism without acute cor pulmonale: Secondary | ICD-10-CM

## 2012-03-06 DIAGNOSIS — R0600 Dyspnea, unspecified: Secondary | ICD-10-CM

## 2012-03-06 DIAGNOSIS — R0989 Other specified symptoms and signs involving the circulatory and respiratory systems: Secondary | ICD-10-CM

## 2012-03-06 HISTORY — DX: Dyspnea, unspecified: R06.00

## 2012-03-06 NOTE — Patient Instructions (Addendum)
Walking oximetry today We will perform full pulmonary function testing at your next visit We will obtain your echocardiogram and stress test results from Dr [name] Fu Follow with Dr Delton Coombes next available with full PFT
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
## 2012-03-06 NOTE — Assessment & Plan Note (Signed)
Continue coumadin.  

## 2012-04-21 ENCOUNTER — Ambulatory Visit (INDEPENDENT_AMBULATORY_CARE_PROVIDER_SITE_OTHER): Payer: Medicare PPO | Admitting: Emergency Medicine

## 2012-04-21 ENCOUNTER — Encounter: Payer: Self-pay | Admitting: Emergency Medicine

## 2012-04-21 ENCOUNTER — Ambulatory Visit (INDEPENDENT_AMBULATORY_CARE_PROVIDER_SITE_OTHER): Payer: PRIVATE HEALTH INSURANCE | Admitting: Emergency Medicine

## 2012-04-21 VITALS — BP 132/86 | HR 70 | Temp 97.2°F | Ht 64.0 in | Wt 196.0 lb

## 2012-04-21 DIAGNOSIS — R0609 Other forms of dyspnea: Secondary | ICD-10-CM

## 2012-04-21 LAB — PULMONARY FUNCTION TEST

## 2012-04-21 NOTE — Patient Instructions (Addendum)
Your breathing testing does not show evidence for asthma or COPD You echocardiogram does not show significant right heart strain We will refer you to cardiopulmonary rehab in [facility] Follow with Dr Delton Coombes in 6 months or sooner if you have any problems

## 2012-04-21 NOTE — Progress Notes (Signed)
PFT done today. 
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
## 2012-05-11 ENCOUNTER — Other Ambulatory Visit: Payer: Self-pay | Admitting: Emergency Medicine

## 2012-05-22 ENCOUNTER — Encounter: Payer: Self-pay | Admitting: Emergency Medicine

## 2012-06-26 ENCOUNTER — Telehealth: Payer: Self-pay | Admitting: Emergency Medicine

## 2012-06-26 NOTE — Telephone Encounter (Signed)
Tried calling to see if there was something that we could do for Dr. Zachery Dauer but their office had already closed.  Will forward this message to RB.

## 2012-06-29 ENCOUNTER — Telehealth: Payer: Self-pay | Admitting: Emergency Medicine

## 2012-06-29 NOTE — Telephone Encounter (Signed)
Duplicate message See 3.13.14 phone note for details

## 2012-06-29 NOTE — Telephone Encounter (Signed)
Duplicate message Original message dated 3.10.14 Will forward to RB and send page and close previous message

## 2012-06-30 NOTE — Telephone Encounter (Signed)
Spoke with dr Zachery Dauer

## 2012-06-30 NOTE — Telephone Encounter (Signed)
Spoke with Dr. Barnes. 

## 2012-06-30 NOTE — Telephone Encounter (Signed)
Spoke w Dr Zachery Dauer

## 2012-07-07 ENCOUNTER — Other Ambulatory Visit: Payer: Self-pay | Admitting: Family Medicine

## 2012-07-07 DIAGNOSIS — Z86711 Personal history of pulmonary embolism: Secondary | ICD-10-CM

## 2012-08-09 ENCOUNTER — Encounter: Payer: Self-pay | Admitting: Gastroenterology

## 2012-09-01 ENCOUNTER — Ambulatory Visit
Admission: RE | Admit: 2012-09-01 | Discharge: 2012-09-01 | Disposition: A | Discharge status: Home / Self Care | Payer: Medicare PPO | Source: Ambulatory Visit | Attending: Family Medicine | Admitting: Family Medicine

## 2012-09-01 DIAGNOSIS — Z86711 Personal history of pulmonary embolism: Secondary | ICD-10-CM

## 2012-11-02 ENCOUNTER — Encounter: Payer: Self-pay | Admitting: Emergency Medicine

## 2012-11-02 ENCOUNTER — Ambulatory Visit (INDEPENDENT_AMBULATORY_CARE_PROVIDER_SITE_OTHER): Payer: Medicare PPO | Admitting: Emergency Medicine

## 2012-11-02 VITALS — BP 124/76 | HR 73 | Temp 96.9°F | Ht 73.5 in | Wt 194.8 lb

## 2012-11-02 DIAGNOSIS — R0609 Other forms of dyspnea: Secondary | ICD-10-CM

## 2012-11-02 DIAGNOSIS — R0989 Other specified symptoms and signs involving the circulatory and respiratory systems: Secondary | ICD-10-CM

## 2012-11-02 NOTE — Assessment & Plan Note (Signed)
Believe that this reflects deconditioning. We discussed starting an exercise routine; she will work on this
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
## 2012-11-02 NOTE — Patient Instructions (Addendum)
I want you to work on exercise routine 3 times a week. Start with 15 minutes and build up your time as stamina allows Follow up with pulmonary as needed

## 2013-05-01 ENCOUNTER — Encounter: Payer: Self-pay | Admitting: Family Medicine

## 2013-05-03 ENCOUNTER — Encounter: Payer: Self-pay | Admitting: Family Medicine

## 2014-02-05 ENCOUNTER — Telehealth: Payer: Self-pay | Admitting: Cardiology

## 2014-02-14 ENCOUNTER — Ambulatory Visit (INDEPENDENT_AMBULATORY_CARE_PROVIDER_SITE_OTHER): Payer: Medicare PPO | Admitting: Emergency Medicine

## 2014-02-14 ENCOUNTER — Encounter: Payer: Self-pay | Admitting: Emergency Medicine

## 2014-02-14 ENCOUNTER — Encounter (INDEPENDENT_AMBULATORY_CARE_PROVIDER_SITE_OTHER): Payer: Self-pay

## 2014-02-14 VITALS — BP 122/80 | HR 89 | Temp 97.7°F | Ht 62.0 in | Wt 189.6 lb

## 2014-02-14 DIAGNOSIS — R0609 Other forms of dyspnea: Secondary | ICD-10-CM
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
## 2014-02-14 NOTE — Patient Instructions (Signed)
Check with [employer] to see the cost of a cardiopulmonary exercise test to look for lung or heart contributions to your shortness of breath.  If you would like to order this test call our office and we will arrange to perform Call or follow with Dr Lamonte Sakai as needed.

## 2014-02-19 ENCOUNTER — Ambulatory Visit: Payer: Self-pay | Admitting: Emergency Medicine

## 2014-03-18 ENCOUNTER — Telehealth: Payer: Self-pay | Admitting: Emergency Medicine

## 2014-03-18 DIAGNOSIS — R0609 Other forms of dyspnea: Principal | ICD-10-CM

## 2014-03-18 NOTE — Telephone Encounter (Signed)
Called and spoke with pt and she is ready to schedule this cardiopulmonary test that she spoke with RB about at her last OV.  This has been scheduled and nothing further is needed.

## 2014-03-20 ENCOUNTER — Telehealth: Payer: Self-pay | Admitting: Emergency Medicine

## 2014-03-20 NOTE — Telephone Encounter (Signed)
Called spoke with pt. She is scheduled for Methacholine challenge test done  over at [facility] 05/11/13. Pt called pulmonary office in [place] and was told they could get her in ASAP for the test to be done sooner. Pt reports this is done at [facility] pulmonary [phone]. Pt wants this test to be done ASAP reason this done over in [place]. Needs referral sent to them to do so.   Spoke with RB and he wants [place] to try and see if this can be done over to [facility] sooner. If not then okay to have done at [facility] as long as we get the data. Please advise PCC's thanks

## 2014-03-21 NOTE — Telephone Encounter (Signed)
The test is not a Methacholine Challenge Test it is a CPX that is scheduled at [facility]. Rhonda J Cobb

## 2014-03-21 NOTE — Telephone Encounter (Signed)
Per RB see if this can still be scheduled sooner than what she is scheduled for. thanks

## 2014-03-22 NOTE — Telephone Encounter (Signed)
Pt calling to check on status of having test doen @ dr Welford Roche office [phone] pt can be reached @ [phone] hm# leave msg or can call cell# [phone].[name]

## 2014-03-22 NOTE — Telephone Encounter (Signed)
PCC's please advise if this test can be scheduled sooner than it is already scheduled for in Jan.  Thanks!

## 2014-03-22 NOTE — Telephone Encounter (Signed)
Called dr donor office and they were closed Friday afternoon will call back on Monday 03/25/14 pt is aware of this she informed me to ask for [facility]
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
## 2014-03-27 NOTE — Telephone Encounter (Signed)
I want her to have a CPST as soon as this can be arranged. I do not care which facility performs the procedure. If it is possible to do it per accepted guidelines sooner some place else and that is fine with me. Try to see if this can be ordered at another facility for example [facility]

## 2014-03-29 NOTE — Telephone Encounter (Signed)
Spoke to pt order faxed to [facility]

## 2014-03-29 NOTE — Telephone Encounter (Signed)
Spoke to [email or web address]  forrst they can do this cpst on 04/26/14 we need to fax order and they will contact pt , [email or web address] with pt [name]

## 2014-04-01 ENCOUNTER — Telehealth: Payer: Self-pay | Admitting: Emergency Medicine

## 2014-04-01 NOTE — Telephone Encounter (Signed)
[email or web address] for pt 

## 2014-04-01 NOTE — Telephone Encounter (Signed)
Pt returned call

## 2014-04-01 NOTE — Telephone Encounter (Signed)
She needs to check with HIM Dept  [facility]
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
## 2014-05-02 ENCOUNTER — Telehealth: Payer: Self-pay | Admitting: Emergency Medicine

## 2014-05-02 NOTE — Telephone Encounter (Signed)
Called spoke with pt. She is wanting to know if we received results from [facility] regarding her CPST done last week. Please advise [place] thanks

## 2014-05-02 NOTE — Telephone Encounter (Signed)
Pt aware and needed nothing further

## 2014-05-02 NOTE — Telephone Encounter (Signed)
RB does not have anything in his box about this patient.

## 2014-05-02 NOTE — Telephone Encounter (Signed)
error 

## 2014-05-08 ENCOUNTER — Telehealth: Payer: Self-pay | Admitting: Emergency Medicine

## 2014-05-08 NOTE — Telephone Encounter (Signed)
Pt is wanting to know if we received CPST from [facility] yet. Please advise RB and Ria Comment thanks

## 2014-05-09 NOTE — Telephone Encounter (Signed)
Advised pt that we have her results and that RB would review them when he was back in the office on 05/27/14. She would like for someone to review them before then.  I had TP to look at them and she states that nothing is abnormal and RB will review these in full when he comes back. Pt is aware of this and was happy to hear that she didn't have any abnormailities on her CPST.

## 2014-05-14 ENCOUNTER — Telehealth: Payer: Self-pay | Admitting: Emergency Medicine
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
## 2014-05-14 NOTE — Telephone Encounter (Signed)
Called left a detailed message on patient machine. Made her aware that as soon as we received the hard copy of these results that we would mail them to her home address. Will send to Ria Comment to keep an eye out for these to ensure that the results are sent to the patient.

## 2014-05-15 NOTE — Telephone Encounter (Signed)
Hard copy has been placed in the mail to the patient. Nothing further is needed.

## 2014-05-17 ENCOUNTER — Encounter (HOSPITAL_COMMUNITY): Payer: Self-pay

## 2014-06-10 DIAGNOSIS — H35033 Hypertensive retinopathy, bilateral: Secondary | ICD-10-CM | POA: Diagnosis not present

## 2014-06-10 DIAGNOSIS — H524 Presbyopia: Secondary | ICD-10-CM | POA: Diagnosis not present

## 2014-06-10 DIAGNOSIS — H52223 Regular astigmatism, bilateral: Secondary | ICD-10-CM | POA: Diagnosis not present

## 2014-10-22 DIAGNOSIS — H02825 Cysts of left lower eyelid: Secondary | ICD-10-CM | POA: Diagnosis not present

## 2014-10-22 DIAGNOSIS — H01005 Unspecified blepharitis left lower eyelid: Secondary | ICD-10-CM | POA: Diagnosis not present

## 2014-10-22 DIAGNOSIS — D485 Neoplasm of uncertain behavior of skin: Secondary | ICD-10-CM | POA: Diagnosis not present

## 2014-10-22 DIAGNOSIS — H02826 Cysts of left eye, unspecified eyelid: Secondary | ICD-10-CM

## 2014-10-22 DIAGNOSIS — H01004 Unspecified blepharitis left upper eyelid: Secondary | ICD-10-CM | POA: Diagnosis not present

## 2014-10-22 HISTORY — DX: Unspecified blepharitis left lower eyelid: H01.005

## 2014-10-22 HISTORY — DX: Cysts of left eye, unspecified eyelid: H02.826

## 2014-10-29 DIAGNOSIS — S2239XA Fracture of one rib, unspecified side, initial encounter for closed fracture: Secondary | ICD-10-CM | POA: Diagnosis not present

## 2014-10-29 DIAGNOSIS — J918 Pleural effusion in other conditions classified elsewhere: Secondary | ICD-10-CM | POA: Diagnosis not present

## 2014-10-29 DIAGNOSIS — I2609 Other pulmonary embolism with acute cor pulmonale: Secondary | ICD-10-CM | POA: Diagnosis not present

## 2014-10-29 DIAGNOSIS — R0789 Other chest pain: Secondary | ICD-10-CM | POA: Diagnosis not present

## 2014-11-04 DIAGNOSIS — E669 Obesity, unspecified: Secondary | ICD-10-CM | POA: Diagnosis not present

## 2014-11-04 DIAGNOSIS — H9190 Unspecified hearing loss, unspecified ear: Secondary | ICD-10-CM | POA: Diagnosis not present

## 2014-11-04 DIAGNOSIS — Z6835 Body mass index (BMI) 35.0-35.9, adult: Secondary | ICD-10-CM | POA: Diagnosis not present

## 2014-11-04 DIAGNOSIS — K219 Gastro-esophageal reflux disease without esophagitis: Secondary | ICD-10-CM | POA: Diagnosis not present

## 2014-11-04 DIAGNOSIS — I10 Essential (primary) hypertension: Secondary | ICD-10-CM | POA: Diagnosis not present

## 2014-11-29 DIAGNOSIS — J918 Pleural effusion in other conditions classified elsewhere: Secondary | ICD-10-CM | POA: Diagnosis not present

## 2014-11-29 DIAGNOSIS — S2239XA Fracture of one rib, unspecified side, initial encounter for closed fracture: Secondary | ICD-10-CM | POA: Diagnosis not present

## 2014-11-29 DIAGNOSIS — R0789 Other chest pain: Secondary | ICD-10-CM | POA: Diagnosis not present

## 2014-11-29 DIAGNOSIS — I2609 Other pulmonary embolism with acute cor pulmonale: Secondary | ICD-10-CM | POA: Diagnosis not present

## 2014-12-23 ENCOUNTER — Emergency Department (HOSPITAL_COMMUNITY): Payer: Medicare PPO

## 2014-12-23 ENCOUNTER — Inpatient Hospital Stay (HOSPITAL_COMMUNITY)
Admission: EM | Admit: 2014-12-23 | Discharge: 2014-12-25 | DRG: 190 | Disposition: A | Discharge status: Home / Self Care | Payer: Medicare PPO | Attending: Family Medicine | Admitting: Family Medicine

## 2014-12-23 ENCOUNTER — Encounter (HOSPITAL_COMMUNITY): Payer: Self-pay | Admitting: Emergency Medicine

## 2014-12-23 DIAGNOSIS — E876 Hypokalemia: Secondary | ICD-10-CM | POA: Diagnosis not present

## 2014-12-23 DIAGNOSIS — R059 Cough, unspecified: Secondary | ICD-10-CM

## 2014-12-23 DIAGNOSIS — R0602 Shortness of breath: Secondary | ICD-10-CM | POA: Diagnosis not present

## 2014-12-23 DIAGNOSIS — I519 Heart disease, unspecified: Secondary | ICD-10-CM | POA: Diagnosis not present

## 2014-12-23 DIAGNOSIS — E559 Vitamin D deficiency, unspecified: Secondary | ICD-10-CM | POA: Diagnosis present

## 2014-12-23 DIAGNOSIS — J9621 Acute and chronic respiratory failure with hypoxia: Secondary | ICD-10-CM | POA: Diagnosis present

## 2014-12-23 DIAGNOSIS — E872 Acidosis: Secondary | ICD-10-CM | POA: Diagnosis present

## 2014-12-23 DIAGNOSIS — R05 Cough: Secondary | ICD-10-CM | POA: Diagnosis present

## 2014-12-23 DIAGNOSIS — Z8701 Personal history of pneumonia (recurrent): Secondary | ICD-10-CM | POA: Diagnosis not present

## 2014-12-23 DIAGNOSIS — J9610 Chronic respiratory failure, unspecified whether with hypoxia or hypercapnia: Secondary | ICD-10-CM

## 2014-12-23 DIAGNOSIS — J440 Chronic obstructive pulmonary disease with acute lower respiratory infection: Secondary | ICD-10-CM | POA: Diagnosis not present

## 2014-12-23 DIAGNOSIS — I868 Varicose veins of other specified sites: Secondary | ICD-10-CM | POA: Diagnosis present

## 2014-12-23 DIAGNOSIS — I8390 Asymptomatic varicose veins of unspecified lower extremity: Secondary | ICD-10-CM | POA: Diagnosis present

## 2014-12-23 DIAGNOSIS — J45909 Unspecified asthma, uncomplicated: Secondary | ICD-10-CM | POA: Diagnosis not present

## 2014-12-23 DIAGNOSIS — I10 Essential (primary) hypertension: Secondary | ICD-10-CM | POA: Diagnosis not present

## 2014-12-23 DIAGNOSIS — Z8249 Family history of ischemic heart disease and other diseases of the circulatory system: Secondary | ICD-10-CM | POA: Diagnosis not present

## 2014-12-23 DIAGNOSIS — K219 Gastro-esophageal reflux disease without esophagitis: Secondary | ICD-10-CM | POA: Diagnosis present

## 2014-12-23 DIAGNOSIS — Z66 Do not resuscitate: Secondary | ICD-10-CM | POA: Diagnosis not present

## 2014-12-23 DIAGNOSIS — Z86711 Personal history of pulmonary embolism: Secondary | ICD-10-CM | POA: Diagnosis present

## 2014-12-23 DIAGNOSIS — R0609 Other forms of dyspnea: Secondary | ICD-10-CM

## 2014-12-23 DIAGNOSIS — R0600 Dyspnea, unspecified: Secondary | ICD-10-CM | POA: Diagnosis not present

## 2014-12-23 HISTORY — DX: Cough, unspecified: R05.9

## 2014-12-23 HISTORY — DX: Acute and chronic respiratory failure with hypoxia: J96.21

## 2014-12-23 HISTORY — DX: Chronic respiratory failure, unspecified whether with hypoxia or hypercapnia: J96.10

## 2014-12-23 HISTORY — DX: Shortness of breath: R06.02

## 2014-12-23 HISTORY — DX: Hypokalemia: E87.6

## 2014-12-23 LAB — CBC WITH DIFFERENTIAL/PLATELET
BASOS ABS: 0 10*3/uL (ref 0.0–0.1)
Basophils Relative: 0 % (ref 0–1)
EOS PCT: 0 % (ref 0–5)
Eosinophils Absolute: 0 10*3/uL (ref 0.0–0.7)
HCT: 38.2 % (ref 36.0–46.0)
Hemoglobin: 13.1 g/dL (ref 12.0–15.0)
LYMPHS ABS: 0.6 10*3/uL — AB (ref 0.7–4.0)
Lymphocytes Relative: 11 % — ABNORMAL LOW (ref 12–46)
MCH: 32 pg (ref 26.0–34.0)
MCHC: 34.3 g/dL (ref 30.0–36.0)
MCV: 93.2 fL (ref 78.0–100.0)
MONOS PCT: 3 % (ref 3–12)
Monocytes Absolute: 0.2 10*3/uL (ref 0.1–1.0)
NEUTROS ABS: 4.9 10*3/uL (ref 1.7–7.7)
NEUTROS PCT: 86 % — AB (ref 43–77)
PLATELETS: 244 10*3/uL (ref 150–400)
RBC: 4.1 MIL/uL (ref 3.87–5.11)
RDW: 13.9 % (ref 11.5–15.5)
WBC: 5.7 10*3/uL (ref 4.0–10.5)

## 2014-12-23 LAB — BASIC METABOLIC PANEL
ANION GAP: 11 (ref 5–15)
BUN: 13 mg/dL (ref 6–20)
CALCIUM: 8.8 mg/dL — AB (ref 8.9–10.3)
CO2: 28 mmol/L (ref 22–32)
Chloride: 102 mmol/L (ref 101–111)
Creatinine, Ser: 0.68 mg/dL (ref 0.44–1.00)
GFR calc Af Amer: >60 mL/min (ref >60)
GLUCOSE: 162 mg/dL — AB (ref 65–99)
Potassium: 2.8 mmol/L — ABNORMAL LOW (ref 3.5–5.1)
Sodium: 141 mmol/L (ref 135–145)

## 2014-12-23 LAB — PROCALCITONIN

## 2014-12-23 LAB — PROTIME-INR
INR: 1.03 (ref 0.00–1.49)
Prothrombin Time: 13.7 seconds (ref 11.6–15.2)

## 2014-12-23 LAB — LACTIC ACID, PLASMA: Lactic Acid, Venous: 7.9 mmol/L (ref 0.5–2.0)

## 2014-12-23 LAB — D-DIMER, QUANTITATIVE: D-Dimer, Quant: 0.28 ug/mL-FEU (ref 0.00–0.48)

## 2014-12-23 LAB — APTT: aPTT: 24 seconds (ref 24–37)

## 2014-12-23 MED ORDER — METHYLPREDNISOLONE SODIUM SUCC 125 MG IJ SOLR
60.0000 mg | Freq: Two times a day (BID) | INTRAMUSCULAR | Status: DC
  Administered 2014-12-23 – 2014-12-25 (×4): 60 mg via INTRAVENOUS
  Filled 2014-12-23 (×6): qty 0.96

## 2014-12-23 MED ORDER — TAB-A-VITE/IRON PO TABS
1.0000 | ORAL_TABLET | Freq: Every day | ORAL | Status: DC
  Administered 2014-12-24 – 2014-12-25 (×2): 1 via ORAL
  Filled 2014-12-23 (×2): qty 1

## 2014-12-23 MED ORDER — HYDROCHLOROTHIAZIDE 12.5 MG PO CAPS
12.5000 mg | ORAL_CAPSULE | Freq: Every day | ORAL | Status: DC
  Administered 2014-12-24 – 2014-12-25 (×2): 12.5 mg via ORAL
  Filled 2014-12-23 (×2): qty 1

## 2014-12-23 MED ORDER — LORATADINE 10 MG PO TABS
10.0000 mg | ORAL_TABLET | Freq: Every day | ORAL | Status: DC
  Administered 2014-12-24 – 2014-12-25 (×2): 10 mg via ORAL
  Filled 2014-12-23 (×2): qty 1

## 2014-12-23 MED ORDER — SODIUM CHLORIDE 0.9 % IV BOLUS (SEPSIS)
500.0000 mL | Freq: Once | INTRAVENOUS | Status: AC
  Administered 2014-12-23: 500 mL via INTRAVENOUS

## 2014-12-23 MED ORDER — SALINE SPRAY 0.65 % NA SOLN
2.0000 | Freq: Two times a day (BID) | NASAL | Status: DC | PRN
  Filled 2014-12-23: qty 44

## 2014-12-23 MED ORDER — POTASSIUM CHLORIDE 10 MEQ/100ML IV SOLN
10.0000 meq | Freq: Once | INTRAVENOUS | Status: AC
  Administered 2014-12-23: 10 meq via INTRAVENOUS
  Filled 2014-12-23: qty 100

## 2014-12-23 MED ORDER — IPRATROPIUM-ALBUTEROL 0.5-2.5 (3) MG/3ML IN SOLN
RESPIRATORY_TRACT | Status: AC
  Filled 2014-12-23: qty 3

## 2014-12-23 MED ORDER — VITAMIN D3 25 MCG (1000 UNIT) PO TABS
1000.0000 [IU] | ORAL_TABLET | Freq: Three times a day (TID) | ORAL | Status: DC
  Administered 2014-12-23 – 2014-12-25 (×6): 1000 [IU] via ORAL
  Filled 2014-12-23 (×8): qty 1

## 2014-12-23 MED ORDER — VALSARTAN-HYDROCHLOROTHIAZIDE 160-25 MG PO TABS: 1.0000 | ORAL_TABLET | Freq: Every day | ORAL | Status: DC

## 2014-12-23 MED ORDER — IRBESARTAN 75 MG PO TABS
75.0000 mg | ORAL_TABLET | Freq: Every day | ORAL | Status: DC
  Administered 2014-12-24 – 2014-12-25 (×2): 75 mg via ORAL
  Filled 2014-12-23 (×2): qty 1

## 2014-12-23 MED ORDER — ALBUTEROL SULFATE (2.5 MG/3ML) 0.083% IN NEBU
2.5000 mg | INHALATION_SOLUTION | RESPIRATORY_TRACT | Status: DC | PRN
  Administered 2014-12-24: 2.5 mg via RESPIRATORY_TRACT
  Filled 2014-12-23: qty 3

## 2014-12-23 MED ORDER — ALUM & MAG HYDROXIDE-SIMETH 200-200-20 MG/5ML PO SUSP: 30.0000 mL | Freq: Four times a day (QID) | ORAL | Status: DC | PRN

## 2014-12-23 MED ORDER — DOXYCYCLINE HYCLATE 100 MG IV SOLR
100.0000 mg | Freq: Once | INTRAVENOUS | Status: AC
  Administered 2014-12-23: 100 mg via INTRAVENOUS
  Filled 2014-12-23: qty 100

## 2014-12-23 MED ORDER — ALENDRONATE SODIUM 70 MG PO TABS: 70.0000 mg | ORAL_TABLET | ORAL | Status: DC

## 2014-12-23 MED ORDER — ALBUTEROL (5 MG/ML) CONTINUOUS INHALATION SOLN
10.0000 mg/h | INHALATION_SOLUTION | RESPIRATORY_TRACT | Status: DC
  Administered 2014-12-23: 10 mg/h via RESPIRATORY_TRACT
  Filled 2014-12-23: qty 20

## 2014-12-23 MED ORDER — DOXYCYCLINE HYCLATE 100 MG PO TABS
100.0000 mg | ORAL_TABLET | Freq: Two times a day (BID) | ORAL | Status: DC
  Administered 2014-12-24 – 2014-12-25 (×3): 100 mg via ORAL
  Filled 2014-12-23 (×5): qty 1

## 2014-12-23 MED ORDER — MAGNESIUM SULFATE 2 GM/50ML IV SOLN
2.0000 g | Freq: Once | INTRAVENOUS | Status: AC
  Administered 2014-12-23: 2 g via INTRAVENOUS
  Filled 2014-12-23 (×2): qty 50

## 2014-12-23 MED ORDER — HEPARIN SODIUM (PORCINE) 5000 UNIT/ML IJ SOLN
5000.0000 [IU] | Freq: Three times a day (TID) | INTRAMUSCULAR | Status: DC
  Administered 2014-12-23 – 2014-12-25 (×6): 5000 [IU] via SUBCUTANEOUS
  Filled 2014-12-23 (×7): qty 1

## 2014-12-23 MED ORDER — POTASSIUM CHLORIDE CRYS ER 20 MEQ PO TBCR
40.0000 meq | EXTENDED_RELEASE_TABLET | Freq: Once | ORAL | Status: AC
  Administered 2014-12-23: 40 meq via ORAL
  Filled 2014-12-23: qty 2

## 2014-12-23 MED ORDER — PANTOPRAZOLE SODIUM 40 MG PO TBEC
40.0000 mg | DELAYED_RELEASE_TABLET | Freq: Every day | ORAL | Status: DC
  Administered 2014-12-24 – 2014-12-25 (×2): 40 mg via ORAL
  Filled 2014-12-23 (×2): qty 1

## 2014-12-23 MED ORDER — IPRATROPIUM-ALBUTEROL 0.5-2.5 (3) MG/3ML IN SOLN
3.0000 mL | RESPIRATORY_TRACT | Status: DC
  Administered 2014-12-23 – 2014-12-24 (×2): 3 mL via RESPIRATORY_TRACT
  Filled 2014-12-23: qty 3

## 2014-12-23 MED ORDER — CALCIUM CARBONATE-VITAMIN D 500-200 MG-UNIT PO TABS
1.0000 | ORAL_TABLET | Freq: Every day | ORAL | Status: DC
  Administered 2014-12-24 – 2014-12-25 (×2): 1 via ORAL
  Filled 2014-12-23 (×2): qty 1

## 2014-12-23 MED ORDER — IPRATROPIUM BROMIDE 0.02 % IN SOLN
1.5000 mg | Freq: Once | RESPIRATORY_TRACT | Status: AC
  Administered 2014-12-23: 1.5 mg via RESPIRATORY_TRACT
  Filled 2014-12-23: qty 7.5

## 2014-12-23 MED ORDER — ASPIRIN EC 81 MG PO TBEC
81.0000 mg | DELAYED_RELEASE_TABLET | Freq: Every day | ORAL | Status: DC
  Administered 2014-12-24 – 2014-12-25 (×2): 81 mg via ORAL
  Filled 2014-12-23 (×2): qty 1

## 2014-12-23 MED ORDER — DM-GUAIFENESIN ER 30-600 MG PO TB12
2.0000 | ORAL_TABLET | Freq: Two times a day (BID) | ORAL | Status: DC | PRN
  Administered 2014-12-23 – 2014-12-25 (×2): 2 via ORAL
  Filled 2014-12-23 (×5): qty 2

## 2014-12-23 NOTE — ED Notes (Signed)
Hospitalist at pt bedside.
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
## 2014-12-23 NOTE — ED Notes (Signed)
Bed: VO72 Expected date:  Expected time:  Means of arrival:  Comments: EMS- 80yo F, SOB/productive cough
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
## 2014-12-23 NOTE — Progress Notes (Signed)
PHARMACIST - PHYSICIAN COMMUNICATION  CONCERNING: P&T Medication Policy Regarding Oral Bisphosphonates  RECOMMENDATION: Your order for alendronate (Fosamax), ibandronate (Boniva), or risedronate (Actonel) has been discontinued at this time.  If the patient's post-hospital medical condition warrants safe use of this class of drugs, please resume the pre-hospital regimen upon discharge.  DESCRIPTION:  Alendronate (Fosamax), ibandronate (Boniva), and risedronate (Actonel) can cause severe esophageal erosions in patients who are unable to remain upright at least 30 minutes after taking this medication.   Since brief interruptions in therapy are thought to have minimal impact on bone mineral density, the [facility] has established that bisphosphonate orders should be routinely discontinued during hospitalization.   To override this safety policy and permit administration of Boniva, Fosamax, or Actonel in the hospital, prescribers must write "DO NOT HOLD" in the comments section when placing the order for this class of medications.   Reuel Boom, PharmD, BCPS Pager: [phone] 12/23/2014, 9:43 PM

## 2014-12-23 NOTE — ED Notes (Signed)
Pt had xray done at [facility] office.

## 2014-12-24 DIAGNOSIS — E876 Hypokalemia: Secondary | ICD-10-CM

## 2014-12-24 DIAGNOSIS — J9621 Acute and chronic respiratory failure with hypoxia: Secondary | ICD-10-CM

## 2014-12-24 DIAGNOSIS — I10 Essential (primary) hypertension: Secondary | ICD-10-CM

## 2014-12-24 LAB — INFLUENZA PANEL BY PCR (TYPE A & B)
H1N1 flu by pcr: NOT DETECTED
INFLAPCR: NEGATIVE
Influenza B By PCR: NEGATIVE

## 2014-12-24 LAB — BASIC METABOLIC PANEL
Anion gap: 12 (ref 5–15)
BUN: 15 mg/dL (ref 6–20)
CHLORIDE: 101 mmol/L (ref 101–111)
CO2: 24 mmol/L (ref 22–32)
CREATININE: 0.81 mg/dL (ref 0.44–1.00)
Calcium: 8.6 mg/dL — ABNORMAL LOW (ref 8.9–10.3)
Glucose, Bld: 161 mg/dL — ABNORMAL HIGH (ref 65–99)
POTASSIUM: 3.5 mmol/L (ref 3.5–5.1)
SODIUM: 137 mmol/L (ref 135–145)

## 2014-12-24 LAB — LACTIC ACID, PLASMA
LACTIC ACID, VENOUS: 2.6 mmol/L — AB (ref 0.5–2.0)
LACTIC ACID, VENOUS: 3.6 mmol/L — AB (ref 0.5–2.0)
LACTIC ACID, VENOUS: 6.3 mmol/L — AB (ref 0.5–2.0)

## 2014-12-24 LAB — STREP PNEUMONIAE URINARY ANTIGEN: Strep Pneumo Urinary Antigen: NEGATIVE

## 2014-12-24 LAB — BRAIN NATRIURETIC PEPTIDE: B NATRIURETIC PEPTIDE 5: 106.8 pg/mL — AB (ref 0.0–100.0)

## 2014-12-24 LAB — MAGNESIUM: Magnesium: 2.5 mg/dL — ABNORMAL HIGH (ref 1.7–2.4)

## 2014-12-24 MED ORDER — IPRATROPIUM-ALBUTEROL 0.5-2.5 (3) MG/3ML IN SOLN
3.0000 mL | Freq: Three times a day (TID) | RESPIRATORY_TRACT | Status: DC
  Administered 2014-12-25 (×2): 3 mL via RESPIRATORY_TRACT
  Filled 2014-12-24 (×3): qty 3

## 2014-12-24 MED ORDER — ALBUTEROL SULFATE (2.5 MG/3ML) 0.083% IN NEBU
2.5000 mg | INHALATION_SOLUTION | RESPIRATORY_TRACT | Status: DC | PRN
Start: 2014-12-24 — End: 2014-12-25

## 2014-12-24 MED ORDER — ACETAMINOPHEN 325 MG PO TABS
650.0000 mg | ORAL_TABLET | ORAL | Status: DC | PRN
  Administered 2014-12-24 (×3): 650 mg via ORAL
  Filled 2014-12-24 (×3): qty 2

## 2014-12-24 MED ORDER — SODIUM CHLORIDE 0.9 % IV BOLUS (SEPSIS)
500.0000 mL | Freq: Once | INTRAVENOUS | Status: AC
  Administered 2014-12-24: 500 mL via INTRAVENOUS

## 2014-12-24 MED ORDER — CHLORHEXIDINE GLUCONATE 0.12 % MT SOLN
15.0000 mL | Freq: Two times a day (BID) | OROMUCOSAL | Status: DC
  Administered 2014-12-24 – 2014-12-25 (×3): 15 mL via OROMUCOSAL
  Filled 2014-12-24 (×4): qty 15

## 2014-12-24 MED ORDER — IPRATROPIUM-ALBUTEROL 0.5-2.5 (3) MG/3ML IN SOLN
3.0000 mL | RESPIRATORY_TRACT | Status: DC
  Administered 2014-12-24 (×4): 3 mL via RESPIRATORY_TRACT
  Filled 2014-12-24 (×4): qty 3

## 2014-12-24 MED ORDER — CETYLPYRIDINIUM CHLORIDE 0.05 % MT LIQD
7.0000 mL | Freq: Two times a day (BID) | OROMUCOSAL | Status: DC
  Administered 2014-12-24 – 2014-12-25 (×3): 7 mL via OROMUCOSAL

## 2014-12-24 MED ORDER — IPRATROPIUM-ALBUTEROL 0.5-2.5 (3) MG/3ML IN SOLN: 3.0000 mL | Freq: Four times a day (QID) | RESPIRATORY_TRACT | Status: DC
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
## 2014-12-24 NOTE — Care Management Note (Signed)
Case Management Note  Patient Details  Name: [name] MRN: [id number] Date of Birth: [birth year]  Subjective/Objective:  80 y/o f admitted w/Respiratory failure w/hypoxia. From home. Has home 02.PT-no f/u.                  Action/Plan:d/c plan home.   Expected Discharge Date:                  Expected Discharge Plan:  Home/Self Care  In-House Referral:     Discharge planning Services  CM Consult  Post Acute Care Choice:   (home 02) Choice offered to:     DME Arranged:    DME Agency:     HH Arranged:    HH Agency:     Status of Service:  In process, will continue to follow  Medicare Important Message Given:    Date Medicare IM Given:    Medicare IM give by:    Date Additional Medicare IM Given:    Additional Medicare Important Message give by:     If discussed at [place] of Stay Meetings, dates discussed:    Additional Comments:  Dessa Phi, RN 12/24/2014, 5:57 PM

## 2014-12-24 NOTE — Progress Notes (Signed)
Patient's D dimer was 0.28. Paged the MD to make him aware and then changed patient's diet to heart healthy.

## 2014-12-24 NOTE — Progress Notes (Signed)
CRITICAL VALUE ALERT  Critical value received:  Lactic acid 7.9  Date of notification:  12/23/14  Time of notification:  2243  Critical value read back:Yes.    Nurse who received alert:  Reynold Bowen  MD notified (1st page):  Schorr  Time of first page: 2244  MD notified (2nd page):  Time of second page:  Responding MD:  Schorr  Time MD responded:  NP put orders in computer for 500cc bolus NS

## 2014-12-24 NOTE — Progress Notes (Signed)
CRITICAL VALUE ALERT  Critical value received:  Lactic Acid 6.3  Date of notification:  12/24/14  Time of notification:  0108  Critical value read back:Yes.    Nurse who received alert:  Reynold Bowen  MD notified (1st page):  Schorr  Time of first page:  0140  MD notified (2nd page):  Time of second page:  Responding MD:  Schorr  Time MD responded:  NP put orders in computer for 500cc bolus of NS

## 2014-12-24 NOTE — Progress Notes (Signed)
RT did assessment on patient. Changed from Q4 to Q6 because of assessment number.The patient was clear and diminished at that time.RN called for a PRN tx and RT changed order back to Q4 due to work of breathing and small wheeze in the lrft upper lobe. RT will continue to monitor.

## 2014-12-24 NOTE — Progress Notes (Signed)
Patient Demographics   [name], is a 80 y.o. female, DOB - May 10, 1934, DIY:[id number]  Admit date - 12/23/2014   Admitting Physician Ivor Costa, MD  Outpatient Primary MD for the patient is Gerrit Heck, MD  LOS - 1    Assessment  & Plan :   1. Acute on chronic respiratory failure with hypoxia- improved, patient currently at baseline. Influenza panel still pending. Patient has underlying COPD, will continue with Solu-Medrol 60 mg IV every 12 hours. DuoNeb nebulizers every 4 hours. Patient was seen by physical therapy, she ambulating the hallway. No PT follow-up recommended. Continue doxycycline 100 milligrams by mouth twice a day. D-dimer was negative( 0.28) 2. Hypertension- blood pressure is stable, continue HCTZ, Avapro. 3. Hypokalemia- patient had potassium of 2.8 on admission, repeated in the ED. Will check BMP today. 4. GERD- continue Protonix 5. DVT prophylaxis- heparin  Code Status: DNR Family Communication: Discussed with husband at bedside Disposition Plan: Home when stable   Consultants: None  Procedures: None  Antibiotics: Doxycycline     Subjective:/ HPI  80 year old female with a past medical history of hypertension, pulmonary embolism, varicose veins, chronic respiratory failure on 2 L oxygen at nighttime at home came with cough shortness of breath and wheezing. Patient was given Solu-Medrol in the ED along with DuoNeb nebulizers. She feels much better this morning. Denies shortness of breath. No chest pain. Patient says that coughing has improved and is not coughing up as much phlegm this morning.    Objective:   Filed Vitals:   12/24/14 1300  BP: 124/64  Pulse: 86  Temp: 97.7 F (36.5 C)  Resp: 20    Intake/Output Summary (Last 24 hours) at 12/24/14 1539 Last data filed at 12/24/14 1400  Gross per 24 hour  Intake   1820 ml  Output    200 ml  Net    1620 ml   Filed Weights   12/23/14 2135  Weight: 85.2 kg (187 lb 13.3 oz)      Exam:   General:  Appears in no acute distress  Cardiovascular: S1-S2 regular  Respiratory: Scattered wheezing bilaterally  Abdomen: Soft, nontender, no organomegaly.  Musculoskeletal: No edema of the lower extremities noted.       Data Review:   Micro Results No results found for this or any previous visit (from the past 240 hour(s)).  Radiology Reports Dg Chest 2 View  12/23/2014   CLINICAL DATA:  Productive cough, short of breath, feeling bad. Wheezing and decreased lung sounds  EXAM: CHEST  2 VIEW  COMPARISON:  Radiograph 12/23/2014  FINDINGS: Normal cardiac silhouette. There is a large hiatal hernia post to the heart. No effusion, infiltrate, or pneumothorax. Chronic atelectasis LEFT lung base.  IMPRESSION: Large hiatal hernia.  No acute cardiopulmonary findings.   Electronically Signed   By: Suzy Bouchard M.D.   On: 12/23/2014 20:54     CBC  Recent Labs Lab 12/23/14 1603  WBC 5.7  HGB 13.1  HCT 38.2  PLT 244  MCV 93.2  MCH 32.0  MCHC 34.3  RDW 13.9  LYMPHSABS 0.6*  MONOABS 0.2  EOSABS 0.0  BASOSABS 0.0    Chemistries   Recent Labs Lab 12/23/14 1603 12/24/14 0500  NA 141  --   K 2.8*  --   CL 102  --   CO2 28  --   GLUCOSE 162*  --   BUN 13  --   CREATININE 0.68  --   CALCIUM 8.8*  --   MG  --  2.5*   ------------------------------------------------------------------------------------------------------------------ estimated creatinine clearance is 58.6 mL/min (by C-G formula based on Cr of 0.68). ------------------------------------------------------------------------------------------------------------------   Time Spent in minutes  20 min  Velicia Dejager S M.D on 12/24/2014 at 3:39 PM  Between 7am to 7pm - Pager - [phone] After 7pm go to [email or web address] - password [email or web address]  [facility] -  Office  [phone]

## 2014-12-24 NOTE — Progress Notes (Signed)
OT  Note  Patient Details Name: [name] MRN: [id number] DOB: 05/10/1934   Cancelled Treatment:    Reason Eval/Treat Not Completed: Other (comment) Pt had just worked with PT and was working with nursing. Will recheck on pt later in day or next day. Thanks,   Betsy Pries 12/24/2014, 9:56 AM

## 2014-12-25 ENCOUNTER — Inpatient Hospital Stay (HOSPITAL_COMMUNITY): Payer: Medicare PPO

## 2014-12-25 DIAGNOSIS — R0600 Dyspnea, unspecified: Secondary | ICD-10-CM

## 2014-12-25 DIAGNOSIS — K219 Gastro-esophageal reflux disease without esophagitis: Secondary | ICD-10-CM

## 2014-12-25 DIAGNOSIS — I519 Heart disease, unspecified: Secondary | ICD-10-CM

## 2014-12-25 LAB — RESPIRATORY VIRUS PANEL
ADENOVIRUS: NEGATIVE
INFLUENZA A: NEGATIVE
INFLUENZA B 1: NEGATIVE
Metapneumovirus: NEGATIVE
Parainfluenza 1: NEGATIVE
Parainfluenza 2: NEGATIVE
Parainfluenza 3: NEGATIVE
RESPIRATORY SYNCYTIAL VIRUS A: NEGATIVE
Respiratory Syncytial Virus B: NEGATIVE
Rhinovirus: POSITIVE — AB

## 2014-12-25 LAB — LEGIONELLA ANTIGEN, URINE

## 2014-12-25 LAB — BLOOD GAS, ARTERIAL
ACID-BASE EXCESS: 0.4 mmol/L (ref 0.0–2.0)
Bicarbonate: 23.6 mEq/L (ref 20.0–24.0)
Drawn by: 270211
O2 Saturation: 91.2 %
PCO2 ART: 34.9 mmHg — AB (ref 35.0–45.0)
PH ART: 7.445 (ref 7.350–7.450)
PO2 ART: 64.6 mmHg — AB (ref 80.0–100.0)
Patient temperature: 98.6
TCO2: 21.3 mmol/L (ref 0–100)

## 2014-12-25 LAB — LACTIC ACID, PLASMA: LACTIC ACID, VENOUS: 3.4 mmol/L — AB (ref 0.5–2.0)

## 2014-12-25 MED ORDER — DOXYCYCLINE HYCLATE 100 MG PO TABS: 100.0000 mg | ORAL_TABLET | Freq: Two times a day (BID) | ORAL | Status: DC

## 2014-12-25 MED ORDER — GUAIFENESIN ER 600 MG PO TB12: 600.0000 mg | ORAL_TABLET | Freq: Two times a day (BID) | ORAL | Status: DC

## 2014-12-25 MED ORDER — PREDNISONE 10 MG PO TABS: ORAL_TABLET | ORAL | Status: DC

## 2014-12-25 MED ORDER — ALBUTEROL SULFATE HFA 108 (90 BASE) MCG/ACT IN AERS: 2.0000 | INHALATION_SPRAY | Freq: Four times a day (QID) | RESPIRATORY_TRACT | Status: DC | PRN

## 2014-12-25 NOTE — Discharge Summary (Signed)
Physician Discharge Summary  [name] XBM:[id number] DOB: Sep 22, 1934 DOA: 12/23/2014  PCP: Gerrit Heck, MD  Admit date: 12/23/2014 Discharge date: 12/25/2014  Time spent: 25 minutes  Recommendations for Outpatient Follow-up:  1. Follow up PCP in 1 week  Discharge Diagnoses:  Principal Problem:   Acute on chronic respiratory failure with hypoxia Active Problems:   Hypertension   Vitamin D deficiency   Varicose vein   GERD (gastroesophageal reflux disease)   History of pulmonary embolism   SOB (shortness of breath)   Hypokalemia   Cough   Discharge Condition: Stable  Diet recommendation: Low salt diet  Filed Weights   12/23/14 2135  Weight: 85.2 kg (187 lb 13.3 oz)    History of present illness:  80 year old female with a past medical history of hypertension, pulmonary embolism, varicose veins, chronic respiratory failure on 2 L oxygen at nighttime at home came with cough shortness of breath and wheezing. Patient was given Solu-Medrol in the ED along with DuoNeb nebulizers. She feels much better this morning. D  Hospital Course:  1. Acute on chronic respiratory failure with hypoxia- improved, patient currently at baseline. Influenza panel still pending. Patient has acute bronchitis and was started on Solu-Medrol 60 mg IV every 12 hours. DuoNeb nebulizers every 4 hours. Patient was seen by physical therapy, she ambulating the hallway. No PT follow-up recommended. Continue doxycycline 100 milligrams by mouth twice a day. D-dimer was negative( 0.28). Will discharge the patient home on prednisone taper, doxepin, Mucinex 1 tablet by mouth twice a day. Albuterol inhaler 2 puffs every 4 hours when necessary. 2. Hypertension- blood pressure is stable, continue HCTZ, Avapro. 3. Grade 1 diastolic dysfunction- patient had echocardiogram which showed grade 1 diastolic dysfunction, ejection fraction 65%. Patient is on HCTZ at home. Will continue with that. 4. Hypokalemia-  repleted, potassium level as of 12/24/2014 was 3.5 5. GERD- continue omeprazole  6.  Elevated lactic acid- when patient came to the hospital, she had elevated lactic acid of 7.9 though she was not septic, and the levels came down to 2.6 yesterday. Repeat lactic acid level this morning again went up to 3.6. Patient has been completely asymptomatic, she is not on any medications which can raise the level of lactic acid. Bicarbonate was 24 this morning, ABG this morning showed bicarbonate 23.6. I called and discussed with nephrologist Dr. Harvin Hazel , and at this time there is no explanation for patient's elevated lactic acid. Renal functions have been normal with creatinine 0.81. 7. Patient pain have a rare disorder causing elevated lactic acid including D lactic acidosis, she will to follow up with her PCP for further evaluation. At this time patient is asymptomatic and will be discharged home .  Procedures:  None   Consultations:  None  Discharge Exam: Filed Vitals:   12/25/14 1429  BP: 143/81  Pulse: 80  Temp: 97.7 F (36.5 C)  Resp: 18    General: Appears in no acute distress  Cardiovascular: S1-S2 regular  Respiratory: clear to auscultation bilaterally   Discharge Instructions    Current Discharge Medication List    START taking these medications   Details  albuterol (PROVENTIL HFA;VENTOLIN HFA) 108 (90 BASE) MCG/ACT inhaler Inhale 2 puffs into the lungs every 6 (six) hours as needed for wheezing or shortness of breath. Qty: 1 Inhaler, Refills: 2    doxycycline (VIBRA-TABS) 100 MG tablet Take 1 tablet (100 mg total) by mouth 2 (two) times daily. Qty: 8 tablet, Refills: 0    guaiFENesin (  MUCINEX) 600 MG 12 hr tablet Take 1 tablet (600 mg total) by mouth 2 (two) times daily. Qty: 10 tablet, Refills: 0    predniSONE (DELTASONE) 10 MG tablet Prednisone 40 mg po daily x 1 day then Prednisone 30 mg po daily x 1 day then Prednisone 20 mg po daily x 1 day then Prednisone 10 mg  daily x 1 day then stop... Qty: 10 tablet, Refills: 0      CONTINUE these medications which have NOT CHANGED   Details  alendronate (FOSAMAX) 70 MG tablet Take 70 mg by mouth once a week. Refills: 5    aspirin EC 81 MG tablet Take 81 mg by mouth daily.    Calcium Carbonate-Vitamin D (RA CALCIUM PLUS VITAMIN D) 600-400 MG-UNIT per tablet Take 1 tablet by mouth daily.    cetirizine (ZYRTEC) 10 MG tablet Take 10 mg by mouth daily as needed for allergies.    cholecalciferol (VITAMIN D) 1000 UNITS tablet Take 1,000 Units by mouth 3 (three) times daily.     Multiple Vitamin (MULTIVITAMIN) tablet Take 1 tablet by mouth daily. With Iron    omeprazole (PRILOSEC) 20 MG capsule Take 20 mg by mouth daily.    OVER THE COUNTER MEDICATION Take 1 tablet by mouth daily. Curamin extra strength    sodium chloride (OCEAN) 0.65 % SOLN nasal spray Place 2 sprays into both nostrils 2 (two) times daily as needed for congestion.    valsartan-hydrochlorothiazide (DIOVAN-HCT) 160-25 MG per tablet Take 0.5 tablets by mouth daily. 1/2 tablet daily      STOP taking these medications     Dextromethorphan-Guaifenesin (MUCINEX DM MAXIMUM STRENGTH) 60-1200 MG TB12        Allergies  Allergen Reactions  . Codeine Nausea Only and Nausea And Vomiting  . Erythromycin Other (See Comments)    unknown  . Sulfamethoxazole Nausea And Vomiting  . Amlodipine Cough      The results of significant diagnostics from this hospitalization (including imaging, microbiology, ancillary and laboratory) are listed below for reference.    Significant Diagnostic Studies: Dg Chest 2 View  12/23/2014   CLINICAL DATA:  Productive cough, short of breath, feeling bad. Wheezing and decreased lung sounds  EXAM: CHEST  2 VIEW  COMPARISON:  Radiograph 12/23/2014  FINDINGS: Normal cardiac silhouette. There is a large hiatal hernia post to the heart. No effusion, infiltrate, or pneumothorax. Chronic atelectasis LEFT lung base.   IMPRESSION: Large hiatal hernia.  No acute cardiopulmonary findings.   Electronically Signed   By: Suzy Bouchard M.D.   On: 12/23/2014 20:54    Microbiology: Recent Results (from the past 240 hour(s))  Culture, blood (x 2)     Status: None (Preliminary result)   Collection Time: 12/23/14  9:45 PM  Result Value Ref Range Status   Specimen Description BLOOD LEFT ANTECUBITAL  Final   Special Requests IN PEDIATRIC BOTTLE .5CC  Final   Culture   Final    NO GROWTH 1 DAY Performed at [facility]    Report Status PENDING  Incomplete  Culture, blood (x 2)     Status: None (Preliminary result)   Collection Time: 12/23/14  9:50 PM  Result Value Ref Range Status   Specimen Description BLOOD BLOOD LEFT HAND  Final   Special Requests BOTTLES DRAWN AEROBIC ONLY [place]  Final   Culture   Final    NO GROWTH 1 DAY Performed at [facility]    Report Status PENDING  Incomplete  Labs: Basic Metabolic Panel:  Recent Labs Lab 12/23/14 1603 12/24/14 0500 12/24/14 1625  NA 141  --  137  K 2.8*  --  3.5  CL 102  --  101  CO2 28  --  24  GLUCOSE 162*  --  161*  BUN 13  --  15  CREATININE 0.68  --  0.81  CALCIUM 8.8*  --  8.6*  MG  --  2.5*  --    Liver Function Tests: No results for input(s): AST, ALT, ALKPHOS, BILITOT, PROT, ALBUMIN in the last 168 hours. No results for input(s): LIPASE, AMYLASE in the last 168 hours. No results for input(s): AMMONIA in the last 168 hours. CBC:  Recent Labs Lab 12/23/14 1603  WBC 5.7  NEUTROABS 4.9  HGB 13.1  HCT 38.2  MCV 93.2  PLT 244   Cardiac Enzymes: No results for input(s): CKTOTAL, CKMB, CKMBINDEX, TROPONINI in the last 168 hours. BNP: BNP (last 3 results)  Recent Labs  12/24/14 0500  BNP 106.8*        Signed:  Holland Nickson S  [facility] 12/25/2014, 5:28 PM

## 2014-12-25 NOTE — Progress Notes (Signed)
Echocardiogram 2D Echocardiogram has been performed.  Tresa Res 12/25/2014, 1:22 PM

## 2014-12-25 NOTE — Progress Notes (Signed)
Lactic acid 3.4 MD notified
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
## 2014-12-25 NOTE — Care Management Important Message (Signed)
Important Message  Patient Details  Name: [name] MRN: [id number] Date of Birth: Jun 03, 1934   Medicare Important Message Given:  Yes-second notification given    Camillo Flaming 12/25/2014, 12:54 [place] Message  Patient Details  Name: [name] MRN: [id number] Date of Birth: 08/26/1934   Medicare Important Message Given:  Yes-second notification given    Camillo Flaming 12/25/2014, 12:53 PM

## 2014-12-29 LAB — CULTURE, BLOOD (ROUTINE X 2)
CULTURE: NO GROWTH
Culture: NO GROWTH

## 2014-12-30 DIAGNOSIS — S2239XA Fracture of one rib, unspecified side, initial encounter for closed fracture: Secondary | ICD-10-CM | POA: Diagnosis not present

## 2014-12-30 DIAGNOSIS — R0789 Other chest pain: Secondary | ICD-10-CM | POA: Diagnosis not present

## 2014-12-30 DIAGNOSIS — I2609 Other pulmonary embolism with acute cor pulmonale: Secondary | ICD-10-CM | POA: Diagnosis not present

## 2014-12-30 DIAGNOSIS — J918 Pleural effusion in other conditions classified elsewhere: Secondary | ICD-10-CM | POA: Diagnosis not present

## 2014-12-31 DIAGNOSIS — R531 Weakness: Secondary | ICD-10-CM | POA: Diagnosis not present

## 2014-12-31 DIAGNOSIS — E872 Acidosis: Secondary | ICD-10-CM | POA: Diagnosis not present

## 2014-12-31 DIAGNOSIS — I5030 Unspecified diastolic (congestive) heart failure: Secondary | ICD-10-CM | POA: Diagnosis not present

## 2014-12-31 DIAGNOSIS — I10 Essential (primary) hypertension: Secondary | ICD-10-CM | POA: Diagnosis not present

## 2014-12-31 DIAGNOSIS — J9621 Acute and chronic respiratory failure with hypoxia: Secondary | ICD-10-CM | POA: Diagnosis not present

## 2015-01-15 DIAGNOSIS — M25562 Pain in left knee: Secondary | ICD-10-CM | POA: Diagnosis not present

## 2015-01-15 DIAGNOSIS — M25561 Pain in right knee: Secondary | ICD-10-CM | POA: Diagnosis not present

## 2015-01-21 DIAGNOSIS — M545 Low back pain: Secondary | ICD-10-CM | POA: Diagnosis not present

## 2015-01-21 DIAGNOSIS — M170 Bilateral primary osteoarthritis of knee: Secondary | ICD-10-CM | POA: Diagnosis not present

## 2015-01-21 DIAGNOSIS — M25572 Pain in left ankle and joints of left foot: Secondary | ICD-10-CM | POA: Diagnosis not present

## 2015-01-29 DIAGNOSIS — J918 Pleural effusion in other conditions classified elsewhere: Secondary | ICD-10-CM | POA: Diagnosis not present

## 2015-01-29 DIAGNOSIS — R0789 Other chest pain: Secondary | ICD-10-CM | POA: Diagnosis not present

## 2015-01-29 DIAGNOSIS — I2609 Other pulmonary embolism with acute cor pulmonale: Secondary | ICD-10-CM | POA: Diagnosis not present

## 2015-01-29 DIAGNOSIS — S2239XA Fracture of one rib, unspecified side, initial encounter for closed fracture: Secondary | ICD-10-CM | POA: Diagnosis not present

## 2015-02-18 DIAGNOSIS — M19072 Primary osteoarthritis, left ankle and foot: Secondary | ICD-10-CM | POA: Diagnosis not present

## 2015-02-18 DIAGNOSIS — M76822 Posterior tibial tendinitis, left leg: Secondary | ICD-10-CM | POA: Diagnosis not present

## 2015-02-27 DIAGNOSIS — M1711 Unilateral primary osteoarthritis, right knee: Secondary | ICD-10-CM | POA: Diagnosis not present

## 2015-02-27 DIAGNOSIS — S83207A Unspecified tear of unspecified meniscus, current injury, left knee, initial encounter: Secondary | ICD-10-CM | POA: Diagnosis not present

## 2015-03-01 DIAGNOSIS — J918 Pleural effusion in other conditions classified elsewhere: Secondary | ICD-10-CM | POA: Diagnosis not present

## 2015-03-01 DIAGNOSIS — I2609 Other pulmonary embolism with acute cor pulmonale: Secondary | ICD-10-CM | POA: Diagnosis not present

## 2015-03-01 DIAGNOSIS — R0789 Other chest pain: Secondary | ICD-10-CM | POA: Diagnosis not present

## 2015-03-01 DIAGNOSIS — S2239XA Fracture of one rib, unspecified side, initial encounter for closed fracture: Secondary | ICD-10-CM | POA: Diagnosis not present

## 2015-03-20 DIAGNOSIS — M1712 Unilateral primary osteoarthritis, left knee: Secondary | ICD-10-CM | POA: Diagnosis not present

## 2015-03-21 DIAGNOSIS — Z961 Presence of intraocular lens: Secondary | ICD-10-CM | POA: Diagnosis not present

## 2015-03-21 DIAGNOSIS — S0502XA Injury of conjunctiva and corneal abrasion without foreign body, left eye, initial encounter: Secondary | ICD-10-CM | POA: Diagnosis not present

## 2015-03-23 DIAGNOSIS — Z961 Presence of intraocular lens: Secondary | ICD-10-CM | POA: Diagnosis not present

## 2015-03-23 DIAGNOSIS — S0502XD Injury of conjunctiva and corneal abrasion without foreign body, left eye, subsequent encounter: Secondary | ICD-10-CM | POA: Diagnosis not present

## 2015-03-25 DIAGNOSIS — Z961 Presence of intraocular lens: Secondary | ICD-10-CM | POA: Diagnosis not present

## 2015-03-25 DIAGNOSIS — H18411 Arcus senilis, right eye: Secondary | ICD-10-CM | POA: Diagnosis not present

## 2015-03-25 DIAGNOSIS — H02839 Dermatochalasis of unspecified eye, unspecified eyelid: Secondary | ICD-10-CM | POA: Diagnosis not present

## 2015-03-25 DIAGNOSIS — I10 Essential (primary) hypertension: Secondary | ICD-10-CM | POA: Diagnosis not present

## 2015-03-31 DIAGNOSIS — S2239XA Fracture of one rib, unspecified side, initial encounter for closed fracture: Secondary | ICD-10-CM | POA: Diagnosis not present

## 2015-03-31 DIAGNOSIS — J918 Pleural effusion in other conditions classified elsewhere: Secondary | ICD-10-CM | POA: Diagnosis not present

## 2015-03-31 DIAGNOSIS — I2609 Other pulmonary embolism with acute cor pulmonale: Secondary | ICD-10-CM | POA: Diagnosis not present

## 2015-03-31 DIAGNOSIS — R0789 Other chest pain: Secondary | ICD-10-CM | POA: Diagnosis not present

## 2015-07-25 ENCOUNTER — Ambulatory Visit (INDEPENDENT_AMBULATORY_CARE_PROVIDER_SITE_OTHER): Payer: Medicare Other | Admitting: Cardiology

## 2015-07-25 ENCOUNTER — Encounter: Payer: Self-pay | Admitting: Cardiology

## 2015-07-25 VITALS — BP 126/76 | HR 74 | Ht 62.0 in | Wt 189.6 lb

## 2015-07-25 DIAGNOSIS — Z86711 Personal history of pulmonary embolism: Secondary | ICD-10-CM

## 2015-07-25 DIAGNOSIS — M25561 Pain in right knee: Secondary | ICD-10-CM | POA: Diagnosis not present

## 2015-07-25 DIAGNOSIS — Z01810 Encounter for preprocedural cardiovascular examination: Secondary | ICD-10-CM

## 2015-07-25 DIAGNOSIS — I4510 Unspecified right bundle-branch block: Secondary | ICD-10-CM

## 2015-07-25 DIAGNOSIS — R0600 Dyspnea, unspecified: Secondary | ICD-10-CM

## 2015-07-25 DIAGNOSIS — M25562 Pain in left knee: Secondary | ICD-10-CM
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
## 2015-07-25 NOTE — Patient Instructions (Signed)
Medication Instructions:  The current medical regimen is effective;  continue present plan and medications.  Testing/Procedures: Your physician has requested that you have a lexiscan myoview. For further information please visit [email or web address]. Please follow instruction sheet, as given.  Follow-Up: Follow up as needed.  Thank you for choosing [facility]!!       

## 2015-07-28 ENCOUNTER — Telehealth (HOSPITAL_COMMUNITY): Payer: Self-pay | Admitting: *Deleted

## 2015-07-28 NOTE — Telephone Encounter (Signed)
Patient given detailed instructions per Myocardial Perfusion Study Information Sheet for the test on 07/30/15 at 0945. Patient notified to arrive 15 minutes early and that it is imperative to arrive on time for appointment to keep from having the test rescheduled.  If you need to cancel or reschedule your appointment, please call the office within 24 hours of your appointment. Failure to do so may result in a cancellation of your appointment, and a $50 no show fee. Patient verbalized understanding.[name], [name]

## 2015-07-30 ENCOUNTER — Ambulatory Visit (HOSPITAL_COMMUNITY): Payer: Medicare Other | Attending: Cardiovascular Disease

## 2015-07-30 DIAGNOSIS — R0600 Dyspnea, unspecified: Secondary | ICD-10-CM | POA: Diagnosis not present

## 2015-07-30 DIAGNOSIS — I4510 Unspecified right bundle-branch block: Secondary | ICD-10-CM | POA: Diagnosis not present

## 2015-07-30 DIAGNOSIS — Z01810 Encounter for preprocedural cardiovascular examination: Secondary | ICD-10-CM | POA: Diagnosis present

## 2015-07-30 DIAGNOSIS — I10 Essential (primary) hypertension: Secondary | ICD-10-CM | POA: Diagnosis not present

## 2015-07-30 MED ORDER — REGADENOSON 0.4 MG/5ML IV SOLN
0.4000 mg | Freq: Once | INTRAVENOUS | Status: AC
  Administered 2015-07-30: 0.4 mg via INTRAVENOUS

## 2015-07-30 MED ORDER — TECHNETIUM TC 99M SESTAMIBI GENERIC - CARDIOLITE
32.7000 | Freq: Once | INTRAVENOUS | Status: AC | PRN
  Administered 2015-07-30: 32.7 via INTRAVENOUS

## 2015-08-04 ENCOUNTER — Ambulatory Visit (HOSPITAL_COMMUNITY): Payer: Medicare Other | Attending: Cardiology

## 2015-08-04 LAB — MYOCARDIAL PERFUSION IMAGING
CHL CUP NUCLEAR SDS: 6
LHR: 0.32
LVDIAVOL: 58 mL (ref 46–106)
LVSYSVOL: 17 mL
Peak HR: 92 {beats}/min
Rest HR: 75 {beats}/min
SRS: 1
SSS: 6
TID: 0.99

## 2015-08-04 MED ORDER — TECHNETIUM TC 99M SESTAMIBI GENERIC - CARDIOLITE
32.7000 | Freq: Once | INTRAVENOUS | Status: AC | PRN
  Administered 2015-08-04: 32.7 via INTRAVENOUS

## 2015-08-06 ENCOUNTER — Ambulatory Visit: Payer: Self-pay | Admitting: Cardiology

## 2015-08-07 ENCOUNTER — Telehealth: Payer: Self-pay | Admitting: Cardiology

## 2015-08-07 NOTE — Telephone Encounter (Signed)
New message   Request for surgical clearance:  1. What type of surgery is being performed? Left knee authoplasy  2. When is this surgery scheduled? Pending   3. Are there any medications that need to be held prior to surgery and how long? Need to be written in the order when to stop and resume   4. Name of physician performing surgery? Dr. Dorna Leitz   5. What is your office phone and fax number? [phone] / fax # [phone]

## 2015-08-07 NOTE — Telephone Encounter (Signed)
[name]  Gated Spect Myocardial Perfusion Imaging with Regadenoson Stress 2 Day Rest/Stress Protocol  Order# [id number]   Reading physician: Josue Hector, MD  Ordering physician: Jerline Pain, MD  Study date: 07/30/15      Patient Information    Name MRN Description   [name] [id number] 81 year old Female    Result Notes    Notes Recorded by Shellia Cleverly, RN on 08/05/2015 at 1:58 PM Reviewed results of testing with pt who states understanding and that she has been cleared for surgery. ------  Notes Recorded by Jerline Pain, MD on 08/04/2015 at 5:17 PM Excellent, reassuring nuclear stress test low risk, no ischemia. She may proceed with her knee surgery with low overall cardiac risk. Candee Furbish, MD          Vitals    Height Weight BMI (Calculated)   5\' 2"  (1.575 m) 189 lb 9.6 oz (86.002 kg) 34.8    Study Highlights     Nuclear stress EF: 71%.  The study is normal.  This is a low risk study.  The left ventricular ejection fraction is hyperdynamic (>65%).  Breast attenuation no ischemia or infarction EF 71%      Nuclear History and Indications    History and Indications Indication for Stress Test: Preoperative clearance for knee surgery History: No history of CAD Cardiac Risk Factors: Hypertension and RBBB  Symptoms: DOE    Stress Findings    ECG Baseline ECG exhibits normal sinus rhythm.Baseline ECG indicates right bundle branch block. .   Stress Findings A pharmacological stress test was performed using IV Lexiscan 0.4mg  over 10 seconds performed without concurrent submaximal exercise.  The patient reported shortness of breath during the stress test. Patient stated she felt funny. The patient experienced no angina during the stress test.   Test was stopped per protocol.   Blood pressure and heart rate demonstrated a normal response to exercise.   Recovery time: 5 minutes. The patient's response to exercise was adequate  for diagnosis.   Response to Stress Arrhythmias during stress: none.  Arrhythmias during recovery: none.    Stress Measurements    Baseline Vitals  Rest HR 75 bpm    Rest BP 132/74 mmHg    Peak Stress Vitals  Peak HR 92 bpm    Peak BP 161/90 mmHg         Nuclear Stress Measurements    LV sys vol 17 mL    TID 0.99     LV dias vol 58 mL    LHR 0.32     SSS 6     SRS 1     SDS 6            Nuclear Stress Findings    Isotope administration Rest isotope was administered with an IV injection of 32.4 mCi Tc38m Sestamibi. Rest SPECT images were obtained approximately 45 minutes post tracer injection. Stress isotope was administered with an IV injection of 33.0 mCi Tc85m Sestamibi 20 seconds post IV Lexiscan administration. Stress SPECT images were obtained approximately 60 minutes post tracer injection.   Nuclear Study Quality Overall image quality is poor.Breast attenuation artifact was present.   Nuclear Measurements Study was gated.   Perfusion Summary Breast attenuation no ischemia or infarction EF 71%   Overall Study Impression Myocardial perfusion is normal. The study is normal. This is a low risk study. Overall left ventricular systolic function was normal. LV cavity size is normal. Nuclear  stress EF: 71%. The left ventricular ejection fraction is hyperdynamic (>65%). There is no prior study for comparison.  From: ACCF/SCAI/STS/AATS/AHA/ASNC/HFSA/SCCT 2012 Appropriate Use Criteria for Coronary Revascularization Focused Update    Wall Scoring    Score Index: 1.000 Percent Normal: 100.0%          The left ventricular wall motion is normal.            Signed    Electronically signed by Josue Hector, MD on 08/04/15 at 1447 EDT     Report approved and finalized on 08/04/2015 1447    Imaging    Imaging Information    Order-Level Documents - 07/30/2015:      Scan on 08/04/2015 1:43 PM by Ginger Carne Kubak : Protocol and data  sheetScan on 08/04/2015 1:43 PM by Gardiner Coins : Protocol and data sheet     Scan on 08/04/2015 1:44 PM by Ginger Carne Kubak : MPI imagesScan on 08/04/2015 1:44 PM by Ginger Carne Kubak : MPI images     Scan on 08/04/2015 1:45 PM by Ginger Carne Kubak : Consent formScan on 08/04/2015 1:45 PM by Gardiner Coins : Consent form     Scan on 07/30/2015 10:43 AM by Provider Default, MDScan on 07/30/2015 10:43 AM by Provider Default, MD    Encounter-Level Documents - 07/30/2015:      Electronic signature on 07/30/2015 9:31 AM     Electronic signature on 07/30/2015 9:30 AM    Exam Information    Status Exam Begun   Exam Ended     Final [99] 07/30/2015 10:04 AM 08/04/2015 1:08 PM    External Result Report    External Result Report    Order   Myocardial Perfusion Imaging [[id number]] (Order [id number])       Procedure Abnormality Status    Myocardial Perfusion Imaging      Order Providers     Authorizing Encounter Billing    Jerline Pain [facility] NM2/TREAD Josue Hector      Original Order     Ordered On Ordered By      07/25/2015 2:56 PM Shellia Cleverly, RN             Associated Diagnoses       ICD-9-CM ICD-10-CM    Pre-operative cardiovascular examination    V72.81 Z01.810    Dyspnea    786.09 R06.00      Order Questions     Question Answer Comment    Where should this test be performed [facility])     Type of stress Lexiscan     Patient weight in lbs 189       Appointments for this Order     07/30/2015 9:45 AM - 15 min [facility] NM2/TREAD (Resource) [facility] [address]      Additional Information     Associated Librarian, academic and Order Details    Collection Information      THIS  MESSAGE ROUTED TO  DR  GRAVES .Adonis Housekeeper

## 2015-12-23 ENCOUNTER — Encounter: Payer: Self-pay | Admitting: Gastroenterology

## 2016-02-13 ENCOUNTER — Telehealth: Payer: Self-pay | Admitting: *Deleted

## 2016-02-13 NOTE — Telephone Encounter (Signed)
Called pt with MD recommendations- canc pv 10-30 and colon for 11-13 and made OV for 12-20 at 1115 am.  Lelan Pons PV

## 2016-02-13 NOTE — Telephone Encounter (Signed)
Patients cannot use O2 at to qualify for LEC. Given her age and medical history she should have an office visit to discuss risks/benefits to colonoscopy
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
## 2016-03-01 ENCOUNTER — Encounter: Payer: Self-pay | Admitting: Gastroenterology

## 2016-04-07 ENCOUNTER — Ambulatory Visit (INDEPENDENT_AMBULATORY_CARE_PROVIDER_SITE_OTHER): Payer: Medicare Other | Admitting: Gastroenterology

## 2016-04-07 ENCOUNTER — Encounter: Payer: Self-pay | Admitting: Gastroenterology

## 2016-04-07 VITALS — BP 132/76 | HR 72 | Ht 62.0 in | Wt 184.0 lb

## 2016-04-07 DIAGNOSIS — Z1212 Encounter for screening for malignant neoplasm of rectum: Secondary | ICD-10-CM | POA: Diagnosis not present

## 2016-04-07 DIAGNOSIS — Z1211 Encounter for screening for malignant neoplasm of colon: Secondary | ICD-10-CM

## 2016-04-07 DIAGNOSIS — J9611 Chronic respiratory failure with hypoxia: Secondary | ICD-10-CM | POA: Diagnosis not present

## 2016-04-07 NOTE — Patient Instructions (Signed)
We have sent your demographic and insurance information to [employer]. They should contact you within the next week regarding your Cologuard (colon cancer screening) test. If you have not heard from them within the next week, please call our office at [phone].  Thank you for choosing me and [facility].  Pricilla Riffle. Dagoberto Ligas., MD., Marval Regal
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
## 2016-06-16 ENCOUNTER — Other Ambulatory Visit: Payer: Self-pay

## 2016-06-16 LAB — COLOGUARD: Cologuard: NEGATIVE

## 2016-07-06 ENCOUNTER — Emergency Department (HOSPITAL_COMMUNITY): Payer: Medicare Other

## 2016-07-06 ENCOUNTER — Encounter (HOSPITAL_COMMUNITY): Payer: Self-pay

## 2016-07-06 ENCOUNTER — Emergency Department (HOSPITAL_COMMUNITY)
Admission: EM | Admit: 2016-07-06 | Discharge: 2016-07-07 | Disposition: A | Discharge status: Home / Self Care | Payer: Medicare Other | Attending: Emergency Medicine | Admitting: Emergency Medicine

## 2016-07-06 DIAGNOSIS — R0609 Other forms of dyspnea: Secondary | ICD-10-CM | POA: Insufficient documentation

## 2016-07-06 DIAGNOSIS — Z85820 Personal history of malignant melanoma of skin: Secondary | ICD-10-CM | POA: Diagnosis not present

## 2016-07-06 DIAGNOSIS — Z79899 Other long term (current) drug therapy: Secondary | ICD-10-CM | POA: Insufficient documentation

## 2016-07-06 DIAGNOSIS — E876 Hypokalemia: Secondary | ICD-10-CM | POA: Insufficient documentation

## 2016-07-06 DIAGNOSIS — Z7982 Long term (current) use of aspirin: Secondary | ICD-10-CM | POA: Diagnosis not present

## 2016-07-06 DIAGNOSIS — E042 Nontoxic multinodular goiter: Secondary | ICD-10-CM | POA: Diagnosis not present

## 2016-07-06 LAB — BASIC METABOLIC PANEL
ANION GAP: 10 (ref 5–15)
BUN: 17 mg/dL (ref 6–20)
CO2: 32 mmol/L (ref 22–32)
Calcium: 9.1 mg/dL (ref 8.9–10.3)
Chloride: 95 mmol/L — ABNORMAL LOW (ref 101–111)
Creatinine, Ser: 1.02 mg/dL — ABNORMAL HIGH (ref 0.44–1.00)
GFR, EST AFRICAN AMERICAN: 59 mL/min — AB (ref >60)
GFR, EST NON AFRICAN AMERICAN: 51 mL/min — AB (ref >60)
Glucose, Bld: 129 mg/dL — ABNORMAL HIGH (ref 65–99)
Potassium: 2.5 mmol/L — CL (ref 3.5–5.1)
Sodium: 137 mmol/L (ref 135–145)

## 2016-07-06 LAB — CBC WITH DIFFERENTIAL/PLATELET
BASOS ABS: 0 10*3/uL (ref 0.0–0.1)
BASOS PCT: 0 %
EOS PCT: 1 %
Eosinophils Absolute: 0.1 10*3/uL (ref 0.0–0.7)
HCT: 27.7 % — ABNORMAL LOW (ref 36.0–46.0)
Hemoglobin: 9.2 g/dL — ABNORMAL LOW (ref 12.0–15.0)
Lymphocytes Relative: 22 %
Lymphs Abs: 1.5 10*3/uL (ref 0.7–4.0)
MCH: 29.3 pg (ref 26.0–34.0)
MCHC: 33.2 g/dL (ref 30.0–36.0)
MCV: 88.2 fL (ref 78.0–100.0)
MONO ABS: 1.2 10*3/uL — AB (ref 0.1–1.0)
Monocytes Relative: 19 %
Neutro Abs: 3.8 10*3/uL (ref 1.7–7.7)
Neutrophils Relative %: 58 %
PLATELETS: 353 10*3/uL (ref 150–400)
RBC: 3.14 MIL/uL — ABNORMAL LOW (ref 3.87–5.11)
RDW: 15.6 % — AB (ref 11.5–15.5)
WBC: 6.6 10*3/uL (ref 4.0–10.5)

## 2016-07-06 LAB — TROPONIN I

## 2016-07-06 MED ORDER — SODIUM CHLORIDE 0.9 % IV SOLN
INTRAVENOUS | Status: DC
  Administered 2016-07-06: 21:00:00 via INTRAVENOUS

## 2016-07-06 MED ORDER — SODIUM CHLORIDE 0.9 % IV SOLN: 30.0000 meq | Freq: Once | INTRAVENOUS | Status: DC

## 2016-07-06 MED ORDER — IOPAMIDOL (ISOVUE-370) INJECTION 76%
100.0000 mL | Freq: Once | INTRAVENOUS | Status: AC | PRN
  Administered 2016-07-06: 74 mL via INTRAVENOUS

## 2016-07-06 MED ORDER — IOPAMIDOL (ISOVUE-370) INJECTION 76%
INTRAVENOUS | Status: AC
  Filled 2016-07-06: qty 100

## 2016-07-06 MED ORDER — POTASSIUM CHLORIDE CRYS ER 20 MEQ PO TBCR
40.0000 meq | EXTENDED_RELEASE_TABLET | Freq: Once | ORAL | Status: AC
  Administered 2016-07-06: 40 meq via ORAL
  Filled 2016-07-06: qty 2

## 2016-07-06 MED ORDER — POTASSIUM CHLORIDE 10 MEQ/100ML IV SOLN
10.0000 meq | INTRAVENOUS | Status: AC
  Administered 2016-07-06 – 2016-07-07 (×3): 10 meq via INTRAVENOUS
  Filled 2016-07-06 (×3): qty 100

## 2016-07-06 MED ORDER — POTASSIUM CHLORIDE CRYS ER 20 MEQ PO TBCR: 60.0000 meq | EXTENDED_RELEASE_TABLET | Freq: Once | ORAL | Status: DC

## 2016-07-06 MED ORDER — POTASSIUM CHLORIDE ER 10 MEQ PO TBCR: 10.0000 meq | EXTENDED_RELEASE_TABLET | Freq: Two times a day (BID) | ORAL | 0 refills | Status: DC

## 2016-07-06 NOTE — ED Triage Notes (Signed)
Patient c/o increasingly SOB x 2 weeks. Patient went to PCP today for the same. Patient was sent to the ED for possible PE. Patient has a history of the same. D-dimer/1.38
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
## 2016-07-06 NOTE — ED Notes (Signed)
No Answer when called

## 2016-07-06 NOTE — Discharge Instructions (Signed)
The Radiologist saw thyroid nodules on your CAT scan and you need to have a ultrasound of your thyroid gland within the next 1-2 weeks. Call your doctor to arrange this. Your potassium was also low and you have been prescribed potassium tablets. Call your doctor to schedule a follow-up visit

## 2016-07-06 NOTE — ED Notes (Signed)
Bed: WA07 Expected date:  Expected time:  Means of arrival:  Comments: triage 

## 2016-07-07 ENCOUNTER — Telehealth: Payer: Self-pay

## 2016-07-07 LAB — BASIC METABOLIC PANEL
ANION GAP: 8 (ref 5–15)
ANION GAP: 9 (ref 5–15)
BUN: 15 mg/dL (ref 6–20)
BUN: 12 mg/dL (ref 6–20)
CALCIUM: 8 mg/dL — AB (ref 8.9–10.3)
CHLORIDE: 99 mmol/L — AB (ref 101–111)
CHLORIDE: 105 mmol/L (ref 101–111)
CO2: 30 mmol/L (ref 22–32)
CO2: 25 mmol/L (ref 22–32)
Calcium: 8.2 mg/dL — ABNORMAL LOW (ref 8.9–10.3)
Creatinine, Ser: 0.76 mg/dL (ref 0.44–1.00)
Creatinine, Ser: 0.73 mg/dL (ref 0.44–1.00)
GFR calc non Af Amer: >60 mL/min (ref >60)
GFR calc non Af Amer: >60 mL/min (ref >60)
Glucose, Bld: 113 mg/dL — ABNORMAL HIGH (ref 65–99)
Glucose, Bld: 97 mg/dL (ref 65–99)
Potassium: 2.7 mmol/L — CL (ref 3.5–5.1)
Potassium: 3.5 mmol/L (ref 3.5–5.1)
Sodium: 137 mmol/L (ref 135–145)
Sodium: 139 mmol/L (ref 135–145)

## 2016-07-07 MED ORDER — POTASSIUM CHLORIDE CRYS ER 20 MEQ PO TBCR
40.0000 meq | EXTENDED_RELEASE_TABLET | Freq: Once | ORAL | Status: AC
  Administered 2016-07-07: 40 meq via ORAL
  Filled 2016-07-07: qty 2

## 2016-07-07 MED ORDER — SODIUM CHLORIDE 0.9 % IV SOLN: 30.0000 meq | Freq: Once | INTRAVENOUS | Status: DC

## 2016-07-07 MED ORDER — POTASSIUM CHLORIDE 10 MEQ/100ML IV SOLN
10.0000 meq | INTRAVENOUS | Status: AC
  Administered 2016-07-07 (×3): 10 meq via INTRAVENOUS
  Filled 2016-07-07 (×3): qty 100

## 2016-07-07 NOTE — ED Provider Notes (Signed)
Patient signed out to me because of hypokalemia-getting intravenous and oral potassium. After completion of 3 runs of potassium chloride 10 mEq, potassium was repeated. It had only increased to 2.7. She's given additional oral and intravenous potassium. Case is signed out to Dr. Vanita Panda.   Delora Fuel, MD 10/93/23 [id number]

## 2016-07-07 NOTE — Telephone Encounter (Signed)
SENT NOTES TO SCHEDULING 

## 2016-07-07 NOTE — ED Notes (Addendum)
Potassium results given to [name], [place]

## 2016-07-07 NOTE — ED Provider Notes (Signed)
Following repletion, potassium is 3.5. Patient d/c w PMD f/u   Carmin Muskrat, MD 07/07/16 1012

## 2016-07-12 ENCOUNTER — Ambulatory Visit: Payer: Self-pay | Admitting: Adult Health

## 2016-07-13 ENCOUNTER — Encounter (INDEPENDENT_AMBULATORY_CARE_PROVIDER_SITE_OTHER): Payer: Self-pay

## 2016-07-13 ENCOUNTER — Ambulatory Visit (INDEPENDENT_AMBULATORY_CARE_PROVIDER_SITE_OTHER): Payer: Medicare Other | Admitting: Cardiology

## 2016-07-13 ENCOUNTER — Encounter: Payer: Self-pay | Admitting: Cardiology

## 2016-07-13 VITALS — BP 110/66 | HR 72 | Ht 62.0 in | Wt 181.4 lb

## 2016-07-13 DIAGNOSIS — K449 Diaphragmatic hernia without obstruction or gangrene: Secondary | ICD-10-CM

## 2016-07-13 DIAGNOSIS — I700 Atherosclerosis of aorta: Secondary | ICD-10-CM

## 2016-07-13 DIAGNOSIS — R0789 Other chest pain: Secondary | ICD-10-CM | POA: Diagnosis not present
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
## 2016-07-13 NOTE — Patient Instructions (Signed)
Medication Instructions:  The current medical regimen is effective;  continue present plan and medications.  Follow-Up: Follow up as needed with Dr Skains.  Thank you for choosing [facility]!!     

## 2016-07-15 ENCOUNTER — Other Ambulatory Visit: Payer: Self-pay | Admitting: Family Medicine

## 2016-07-15 DIAGNOSIS — E041 Nontoxic single thyroid nodule: Secondary | ICD-10-CM

## 2016-07-21 ENCOUNTER — Ambulatory Visit
Admission: RE | Admit: 2016-07-21 | Discharge: 2016-07-21 | Disposition: A | Discharge status: Home / Self Care | Payer: Medicare Other | Source: Ambulatory Visit | Attending: Family Medicine | Admitting: Family Medicine

## 2016-07-21 DIAGNOSIS — E041 Nontoxic single thyroid nodule: Secondary | ICD-10-CM

## 2016-08-05 ENCOUNTER — Other Ambulatory Visit: Payer: Self-pay | Admitting: Family Medicine

## 2016-08-05 DIAGNOSIS — E041 Nontoxic single thyroid nodule: Secondary | ICD-10-CM

## 2016-08-16 ENCOUNTER — Encounter: Payer: Self-pay | Admitting: Gastroenterology

## 2016-08-16 ENCOUNTER — Encounter (INDEPENDENT_AMBULATORY_CARE_PROVIDER_SITE_OTHER): Payer: Self-pay

## 2016-08-16 ENCOUNTER — Ambulatory Visit (INDEPENDENT_AMBULATORY_CARE_PROVIDER_SITE_OTHER): Payer: Medicare Other | Admitting: Gastroenterology

## 2016-08-16 VITALS — BP 110/62 | HR 88 | Ht 61.0 in | Wt 184.5 lb

## 2016-08-16 DIAGNOSIS — R05 Cough: Secondary | ICD-10-CM | POA: Diagnosis not present

## 2016-08-16 DIAGNOSIS — K219 Gastro-esophageal reflux disease without esophagitis: Secondary | ICD-10-CM

## 2016-08-16 DIAGNOSIS — R1310 Dysphagia, unspecified: Secondary | ICD-10-CM | POA: Diagnosis not present

## 2016-08-16 DIAGNOSIS — R059 Cough, unspecified: Secondary | ICD-10-CM

## 2016-08-16 NOTE — Patient Instructions (Signed)
You have been scheduled for a Barium Esophogram/modified barium swallow at [facility] (1st floor of the hospital) on 08/23/16 at 11:00am. Please arrive 15 minutes prior to your appointment for registration. Make certain not to have anything to eat or drink 6 hours prior to your test. If you need to reschedule for any reason, please contact radiology at [phone] to do so. __________________________________________________________________ A barium swallow is an examination that concentrates on views of the esophagus. This tends to be a double contrast exam (barium and two liquids which, when combined, create a gas to distend the wall of the oesophagus) or single contrast (non-ionic iodine based). The study is usually tailored to your symptoms so a good history is essential. Attention is paid during the study to the form, structure and configuration of the esophagus, looking for functional disorders (such as aspiration, dysphagia, achalasia, motility and reflux) EXAMINATION You may be asked to change into a gown, depending on the type of swallow being performed. A radiologist and radiographer will perform the procedure. The radiologist will advise you of the type of contrast selected for your procedure and direct you during the exam. You will be asked to stand, sit or lie in several different positions and to hold a small amount of fluid in your mouth before being asked to swallow while the imaging is performed .In some instances you may be asked to swallow barium coated marshmallows to assess the motility of a solid food bolus. The exam can be recorded as a digital or video fluoroscopy procedure. POST PROCEDURE It will take 1-2 days for the barium to pass through your system. To facilitate this, it is important, unless otherwise directed, to increase your fluids for the next 24-48hrs and to resume your normal diet.  This test typically takes about 30 minutes to  perform. _________________________________________________________________________Thank you for choosing me and [facility].  Pricilla Riffle. Dagoberto Ligas., MD., Marval Regal

## 2016-08-16 NOTE — Progress Notes (Signed)
    History of Present Illness: This is a 82 year old female referred by Leighton Ruff, MD for coughing, choking with liquids for about 1 year and intermittent solid food dysphagia for about 1 year. GERD symptoms are controlled on omeprazole daily. She has chronic respiratory failure maintained on nocturnal oxygen, history of bilateral pulmonary emboli. She was hospitalized in September 2016 for acute on chronic respiratory failure with hypoxemia. Cologuard in 05/2016 was negative.   Current Medications, Allergies, Past Medical History, Past Surgical History, Family History and Social History were reviewed in [employer] record.  Physical Exam: General: Well developed, well nourished, no acute distress Head: Normocephalic and atraumatic Eyes:  sclerae anicteric, EOMI Ears: Normal auditory acuity Mouth: No deformity or lesions Lungs: Clear throughout to auscultation Heart: Regular rate and rhythm; no murmurs, rubs or bruits Abdomen: Soft, non tender and non distended. No masses, hepatosplenomegaly or hernias noted. Normal Bowel sounds Musculoskeletal: Symmetrical with no gross deformities  Pulses:  Normal pulses noted Extremities: No clubbing, cyanosis, edema or deformities noted Neurological: Alert oriented x 4, grossly nonfocal Psychological:  Alert and cooperative. Normal mood and affect  Assessment and Recommendations:  1. Unspecified dysphagia with symptomatic features of oropharyngeal and esophageal dysphagia.  Schedule barium esophagram and MBSS.   2.  GERD.  Omeprazole 20 mg daily and antireflux measures.

## 2016-08-18 ENCOUNTER — Other Ambulatory Visit (HOSPITAL_COMMUNITY): Payer: Self-pay | Admitting: Gastroenterology

## 2016-08-18 DIAGNOSIS — R1319 Other dysphagia: Secondary | ICD-10-CM

## 2016-08-23 ENCOUNTER — Ambulatory Visit (HOSPITAL_COMMUNITY): Payer: Medicare Other

## 2016-09-01 ENCOUNTER — Ambulatory Visit
Admission: RE | Admit: 2016-09-01 | Discharge: 2016-09-01 | Disposition: A | Discharge status: Home / Self Care | Payer: Medicare Other | Source: Ambulatory Visit | Attending: Family Medicine | Admitting: Family Medicine

## 2016-09-01 ENCOUNTER — Other Ambulatory Visit: Payer: Self-pay | Admitting: Family Medicine

## 2016-09-01 DIAGNOSIS — E041 Nontoxic single thyroid nodule: Secondary | ICD-10-CM

## 2016-09-06 ENCOUNTER — Ambulatory Visit (HOSPITAL_COMMUNITY)
Admission: RE | Admit: 2016-09-06 | Discharge: 2016-09-06 | Disposition: A | Discharge status: Home / Self Care | Payer: Medicare Other | Source: Ambulatory Visit | Attending: Gastroenterology | Admitting: Gastroenterology

## 2016-09-06 DIAGNOSIS — R1310 Dysphagia, unspecified: Secondary | ICD-10-CM | POA: Insufficient documentation

## 2016-09-06 DIAGNOSIS — R1319 Other dysphagia: Secondary | ICD-10-CM

## 2016-09-06 DIAGNOSIS — R05 Cough: Secondary | ICD-10-CM | POA: Insufficient documentation

## 2016-09-06 DIAGNOSIS — R059 Cough, unspecified: Secondary | ICD-10-CM

## 2016-09-06 DIAGNOSIS — K219 Gastro-esophageal reflux disease without esophagitis: Secondary | ICD-10-CM | POA: Insufficient documentation

## 2016-09-06 DIAGNOSIS — K449 Diaphragmatic hernia without obstruction or gangrene: Secondary | ICD-10-CM | POA: Diagnosis not present

## 2016-09-06 NOTE — Progress Notes (Signed)
Modified Barium Swallow Progress Note  Patient Details  Name: [name] MRN: [id number] Date of Birth: 1935/02/18  Today's Date: 09/06/2016  Modified Barium Swallow completed.  Full report located under Chart Review in the Imaging Section.  Brief recommendations include the following:  Clinical Impression  Pt's oral and pharyngeal phase of swallow was within functional limits. A primary esophageal dysphagia is suspected as evidenced by episode of LPR (laryngopharyngeal reflux) as thin barium passed through UES (out of view on monitor) then large amount came up forcefully into pt's pyriform sinsues with and penetrated laryngeal vestibule and cleared with spontaneous swallow. No other episodes observed. MBS does not diagnose below the level of the UES however esophagua scanned and revealed pill stopped at distal esophagus with column of barium above it which eventually cleared. Pill did not clear with applesauce trials. Recommend regular/thin (use caution with bread and meats) and educated pt on refux precautions verbally and with hand out. Consider GI consult if symptoms persist.     Swallow Evaluation Recommendations   Recommended Consults: Consider GI evaluation   SLP Diet Recommendations: Regular solids;Thin liquid   Liquid Administration via: Cup;Straw   Medication Administration: Whole meds with liquid   Supervision: Patient able to self feed   Compensations: Follow solids with liquid   Postural Changes: Remain semi-upright after after feeds/meals (Comment);Seated upright at 90 degrees   Oral Care Recommendations: Oral care BID        [place] Siren 09/06/2016,1:59 PM   Orbie Pyo [place].Ed [employer] [phone]

## 2016-09-08 ENCOUNTER — Other Ambulatory Visit: Payer: Self-pay

## 2016-09-08 DIAGNOSIS — K449 Diaphragmatic hernia without obstruction or gangrene: Secondary | ICD-10-CM

## 2016-09-09 ENCOUNTER — Telehealth: Payer: Self-pay | Admitting: Gastroenterology

## 2016-09-09 ENCOUNTER — Encounter (HOSPITAL_COMMUNITY): Payer: Self-pay | Admitting: *Deleted

## 2016-09-09 NOTE — Telephone Encounter (Signed)
Patient advised that I did put in the wrong type of case yesterday.  She is to have a EGD not a colonoscopy.  I spoke with central scheduling and the case has been changed to an EGD.  All questions answered

## 2016-09-14 ENCOUNTER — Encounter (HOSPITAL_COMMUNITY): Payer: Self-pay

## 2016-09-14 ENCOUNTER — Ambulatory Visit (HOSPITAL_COMMUNITY): Payer: Medicare Other | Admitting: Anesthesiology

## 2016-09-14 ENCOUNTER — Ambulatory Visit (HOSPITAL_COMMUNITY)
Admission: RE | Admit: 2016-09-14 | Discharge: 2016-09-14 | Disposition: A | Discharge status: Home / Self Care | Payer: Medicare Other | Source: Ambulatory Visit | Attending: Gastroenterology | Admitting: Gastroenterology

## 2016-09-14 ENCOUNTER — Encounter (HOSPITAL_COMMUNITY)
Admission: RE | Disposition: A | Discharge status: Home / Self Care | Payer: Self-pay | Source: Ambulatory Visit | Attending: Gastroenterology

## 2016-09-14 DIAGNOSIS — K219 Gastro-esophageal reflux disease without esophagitis: Secondary | ICD-10-CM | POA: Diagnosis not present

## 2016-09-14 DIAGNOSIS — K449 Diaphragmatic hernia without obstruction or gangrene: Secondary | ICD-10-CM

## 2016-09-14 DIAGNOSIS — Z79899 Other long term (current) drug therapy: Secondary | ICD-10-CM | POA: Insufficient documentation

## 2016-09-14 DIAGNOSIS — I10 Essential (primary) hypertension: Secondary | ICD-10-CM | POA: Diagnosis not present

## 2016-09-14 DIAGNOSIS — R1319 Other dysphagia: Secondary | ICD-10-CM

## 2016-09-14 DIAGNOSIS — R1310 Dysphagia, unspecified: Secondary | ICD-10-CM

## 2016-09-14 DIAGNOSIS — Z9981 Dependence on supplemental oxygen: Secondary | ICD-10-CM | POA: Insufficient documentation

## 2016-09-14 DIAGNOSIS — K222 Esophageal obstruction: Secondary | ICD-10-CM

## 2016-09-14 DIAGNOSIS — R933 Abnormal findings on diagnostic imaging of other parts of digestive tract: Secondary | ICD-10-CM | POA: Diagnosis not present

## 2016-09-14 HISTORY — DX: Unspecified osteoarthritis, unspecified site: M19.90

## 2016-09-14 HISTORY — DX: Other specified postprocedural states: R11.2

## 2016-09-14 HISTORY — PX: ESOPHAGOGASTRODUODENOSCOPY (EGD) WITH PROPOFOL: SHX5813

## 2016-09-14 HISTORY — DX: Dyspnea, unspecified: R06.00

## 2016-09-14 HISTORY — DX: Other specified postprocedural states: Z98.890

## 2016-09-14 SURGERY — ESOPHAGOGASTRODUODENOSCOPY (EGD) WITH PROPOFOL
Anesthesia: Monitor Anesthesia Care

## 2016-09-14 MED ORDER — PROPOFOL 10 MG/ML IV BOLUS
INTRAVENOUS | Status: DC | PRN
  Administered 2016-09-14 (×2): 20 mg via INTRAVENOUS
  Administered 2016-09-14: 10 mg via INTRAVENOUS

## 2016-09-14 MED ORDER — PROPOFOL 10 MG/ML IV BOLUS
INTRAVENOUS | Status: AC
  Filled 2016-09-14: qty 40

## 2016-09-14 MED ORDER — LIDOCAINE 2% (20 MG/ML) 5 ML SYRINGE
INTRAMUSCULAR | Status: DC | PRN
  Administered 2016-09-14: 60 mg via INTRAVENOUS

## 2016-09-14 MED ORDER — LIDOCAINE 2% (20 MG/ML) 5 ML SYRINGE
INTRAMUSCULAR | Status: AC
  Filled 2016-09-14: qty 5

## 2016-09-14 MED ORDER — SODIUM CHLORIDE 0.9 % IV SOLN: INTRAVENOUS | Status: DC

## 2016-09-14 MED ORDER — PROPOFOL 500 MG/50ML IV EMUL
INTRAVENOUS | Status: DC | PRN
  Administered 2016-09-14: 100 ug/kg/min via INTRAVENOUS

## 2016-09-14 MED ORDER — LACTATED RINGERS IV SOLN
INTRAVENOUS | Status: DC | PRN
  Administered 2016-09-14: 10:00:00 via INTRAVENOUS

## 2016-09-14 SURGICAL SUPPLY — 14 items

## 2016-09-14 NOTE — Anesthesia Procedure Notes (Signed)
Procedure Name: MAC Date/Time: 09/14/2016 11:26 AM Performed by: Dione Booze Pre-anesthesia Checklist: Patient identified, Emergency Drugs available, Suction available and Patient being monitored Patient Re-evaluated:Patient Re-evaluated prior to inductionOxygen Delivery Method: Nasal cannula Placement Confirmation: positive ETCO2

## 2016-09-14 NOTE — Transfer of Care (Signed)
Immediate Anesthesia Transfer of Care Note  Patient: [name]  Procedure(s) Performed: Procedure(s): ESOPHAGOGASTRODUODENOSCOPY (EGD) WITH PROPOFOL (N/A)  Patient Location: PACU and Endoscopy Unit  Anesthesia Type:MAC  Level of Consciousness: awake and patient cooperative  Airway & Oxygen Therapy: Patient Spontanous Breathing and Patient connected to nasal cannula oxygen  Post-op Assessment: Report given to RN and Post -op Vital signs reviewed and stable  Post vital signs: Reviewed and stable  Last Vitals:  Vitals:   09/14/16 0945  BP: 132/71  Pulse: 60  Resp: 14  Temp: 36.6 C    Last Pain:  Vitals:   09/14/16 0945  TempSrc: Oral         Complications: No apparent anesthesia complications

## 2016-09-14 NOTE — H&P (View-Only) (Signed)
    History of Present Illness: This is a 82 year old female referred by Leighton Ruff, MD for coughing, choking with liquids for about 1 year and intermittent solid food dysphagia for about 1 year. GERD symptoms are controlled on omeprazole daily. She has chronic respiratory failure maintained on nocturnal oxygen, history of bilateral pulmonary emboli. She was hospitalized in September 2016 for acute on chronic respiratory failure with hypoxemia. Cologuard in 05/2016 was negative.   Current Medications, Allergies, Past Medical History, Past Surgical History, Family History and Social History were reviewed in [employer] record.  Physical Exam: General: Well developed, well nourished, no acute distress Head: Normocephalic and atraumatic Eyes:  sclerae anicteric, EOMI Ears: Normal auditory acuity Mouth: No deformity or lesions Lungs: Clear throughout to auscultation Heart: Regular rate and rhythm; no murmurs, rubs or bruits Abdomen: Soft, non tender and non distended. No masses, hepatosplenomegaly or hernias noted. Normal Bowel sounds Musculoskeletal: Symmetrical with no gross deformities  Pulses:  Normal pulses noted Extremities: No clubbing, cyanosis, edema or deformities noted Neurological: Alert oriented x 4, grossly nonfocal Psychological:  Alert and cooperative. Normal mood and affect  Assessment and Recommendations:  1. Unspecified dysphagia with symptomatic features of oropharyngeal and esophageal dysphagia.  Schedule barium esophagram and MBSS.   2.  GERD.  Omeprazole 20 mg daily and antireflux measures.

## 2016-09-14 NOTE — Discharge Instructions (Signed)
Esophagogastroduodenoscopy, Care After °Refer to this sheet in the next few weeks. These instructions provide you with information about caring for yourself after your procedure. Your health care provider may also give you more specific instructions. Your treatment has been planned according to current medical practices, but problems sometimes occur. Call your health care provider if you have any problems or questions after your procedure. °What can I expect after the procedure? °After the procedure, it is common to have: °· A sore throat. °· Nausea. °· Bloating. °· Dizziness. °· Fatigue. °Follow these instructions at home: °· Do not eat or drink anything until the numbing medicine (local anesthetic) has worn off and your gag reflex has returned. You will know that the local anesthetic has worn off when you can swallow comfortably. °· Do not drive for 24 hours if you received a medicine to help you relax (sedative). °· If your health care provider took a tissue sample for testing during the procedure, make sure to get your test results. This is your responsibility. Ask your health care provider or the department performing the test when your results will be ready. °· Keep all follow-up visits as told by your health care provider. This is important. °Contact a health care provider if: °· You cannot stop coughing. °· You are not urinating. °· You are urinating less than usual. °Get help right away if: °· You have trouble swallowing. °· You cannot eat or drink. °· You have throat or chest pain that gets worse. °· You are dizzy or light-headed. °· You faint. °· You have nausea or vomiting. °· You have chills. °· You have a fever. °· You have severe abdominal pain. °· You have black, tarry, or bloody stools. °This information is not intended to replace advice given to you by your health care provider. Make sure you discuss any questions you have with your health care provider. °Document Released: 03/22/2012 Document  Revised: 09/11/2015 Document Reviewed: 02/27/2015 °Elsevier Interactive Patient Education © 2017 Elsevier Inc. ° °

## 2016-09-14 NOTE — Interval H&P Note (Signed)
History and Physical Interval Note:  09/14/2016 11:02 AM  Ree Edman Halliwell  has presented today for surgery, with the diagnosis of paraesophgeal hernia EGJ stricture  The various methods of treatment have been discussed with the patient and family. After consideration of risks, benefits and other options for treatment, the patient has consented to  Procedure(s): ESOPHAGOGASTRODUODENOSCOPY (EGD) WITH PROPOFOL (N/A) as a surgical intervention .  The patient's history has been reviewed, patient examined, no change in status, stable for surgery.  I have reviewed the patient's chart and labs.  Questions were answered to the patient's satisfaction.     Pricilla Riffle. Fuller Plan

## 2016-09-14 NOTE — Anesthesia Preprocedure Evaluation (Signed)
Anesthesia Evaluation  Patient identified by MRN, date of birth, ID band Patient awake    Reviewed: Allergy & Precautions, NPO status , Patient's Chart, lab work & pertinent test results  History of Anesthesia Complications (+) PONV  Airway Mallampati: II  TM Distance: >3 FB Neck ROM: Full    Dental no notable dental hx.    Pulmonary neg pulmonary ROS,    Pulmonary exam normal breath sounds clear to auscultation       Cardiovascular hypertension, Normal cardiovascular exam Rhythm:Regular Rate:Normal     Neuro/Psych negative neurological ROS  negative psych ROS   GI/Hepatic Neg liver ROS, GERD  ,  Endo/Other  negative endocrine ROS  Renal/GU negative Renal ROS  negative genitourinary   Musculoskeletal negative musculoskeletal ROS (+)   Abdominal   Peds negative pediatric ROS (+)  Hematology negative hematology ROS (+)   Anesthesia Other Findings   Reproductive/Obstetrics negative OB ROS                             Anesthesia Physical Anesthesia Plan  ASA: II  Anesthesia Plan: MAC   Post-op Pain Management:    Induction: Intravenous  Airway Management Planned: Nasal Cannula  Additional Equipment:   Intra-op Plan:   Post-operative Plan:   Informed Consent: I have reviewed the patients History and Physical, chart, labs and discussed the procedure including the risks, benefits and alternatives for the proposed anesthesia with the patient or authorized representative who has indicated his/her understanding and acceptance.   Dental advisory given  Plan Discussed with: CRNA and Surgeon  Anesthesia Plan Comments:         Anesthesia Quick Evaluation

## 2016-09-14 NOTE — Anesthesia Postprocedure Evaluation (Addendum)
Anesthesia Post Note  Patient: [name]  Procedure(s) Performed: Procedure(s) (LRB): ESOPHAGOGASTRODUODENOSCOPY (EGD) WITH PROPOFOL (N/A)  Patient location during evaluation: PACU Anesthesia Type: MAC Level of consciousness: awake and alert Pain management: pain level controlled Vital Signs Assessment: post-procedure vital signs reviewed and stable Respiratory status: spontaneous breathing, nonlabored ventilation, respiratory function stable and patient connected to nasal cannula oxygen Cardiovascular status: stable and blood pressure returned to baseline Anesthetic complications: no       Last Vitals:  Vitals:   09/14/16 1152 09/14/16 1210  BP: (!) 129/53 (!) 154/71  Pulse: 71 63  Resp: 17 (!) 22  Temp: 36.4 C     Last Pain:  Vitals:   09/14/16 0945  TempSrc: Oral                 [place] S

## 2016-09-14 NOTE — Op Note (Signed)
[facility] Patient Name: [name] Procedure Date: 09/14/2016 MRN: [id number] Attending MD: Ladene Artist , MD Date of Birth: 08-10-1934 CSN: [id number] Age: 82 Admit Type: Inpatient Procedure:                Upper GI endoscopy Indications:              Dysphagia, Gastro-esophageal reflux disease,                            Abnormal UGI series Providers:                Pricilla Riffle. Fuller Plan, MD, Zenon Mayo, RN, William Dalton, Technician, Alan Mulder, Technician Referring MD:             Rudean Hitt MD Medicines:                Monitored Anesthesia Care Complications:            No immediate complications. Estimated Blood Loss:     Estimated blood loss: none. Procedure:                Pre-Anesthesia Assessment:                           - Prior to the procedure, a History and Physical                            was performed, and patient medications and                            allergies were reviewed. The patient's tolerance of                            previous anesthesia was also reviewed. The risks                            and benefits of the procedure and the sedation                            options and risks were discussed with the patient.                            All questions were answered, and informed consent                            was obtained. Prior Anticoagulants: The patient has                            taken no previous anticoagulant or antiplatelet                            agents. ASA Grade Assessment: III - A patient with  severe systemic disease. After reviewing the risks                            and benefits, the patient was deemed in                            satisfactory condition to undergo the procedure.                           After obtaining informed consent, the endoscope was                            passed under direct vision. Throughout the                             procedure, the patient's blood pressure, pulse, and                            oxygen saturations were monitored continuously. The                            EG-[id number] ([id number]) scope was introduced through the                            mouth, and advanced to the second part of duodenum.                            The upper GI endoscopy was accomplished without                            difficulty. The patient tolerated the procedure                            well. Scope In: Scope Out: Findings:      One moderate benign-appearing, intrinsic stenosis was found 34 cm from       the incisors. This measured 1.2 cm (inner diameter) and was traversed. A       guidewire was placed and the scope was withdrawn. Dilations were       performed with a Savary dilators with mild resistance at 12.8 mm, 14 mm       and 15 mm. Estimated blood loss: none.      One mild, small extrinsic stenosis was found 33 cm from the incisors.       This measured 1.3 cm (inner diameter) x less than one cm (in length).       Appeared to be related the large HH.      The exam of the esophagus was otherwise normal.      A large paraesophageal hiatal hernia was present.      The exam of the stomach was otherwise normal.      The duodenal bulb and second portion of the duodenum were normal. Impression:               - Benign-appearing esophageal stenosis. Dilated.                           -  Extrinsic narrowing of the esophagus.                           - Large paraesophageal hiatal hernia.                           - Normal duodenal bulb and second portion of the                            duodenum.                           - No specimens collected. Moderate Sedation:      N/A- Per Anesthesia Care Recommendation:           - Patient has a contact number available for                            emergencies. The signs and symptoms of potential                            delayed complications were  discussed with the                            patient. Return to normal activities tomorrow.                            Written discharge instructions were provided to the                            patient.                           - Clear liquid diet for 2 hours, then advance as                            tolerated to soft diet today. Resume prior diet                            tomorrow.                           - Continue present medications.                           - Discussed option of surgical referral for                            consideration of repair of paraesophageal hernia                            and she declined. If dysphagia does not resolve or                            other symptoms develop she will reconsider. Procedure Code(s):        --- Professional ---  43248, Esophagogastroduodenoscopy, flexible,                            transoral; with insertion of guide wire followed by                            passage of dilator(s) through esophagus over guide                            wire Diagnosis Code(s):        --- Professional ---                           K22.2, Esophageal obstruction                           K44.9, Diaphragmatic hernia without obstruction or                            gangrene                           R13.10, Dysphagia, unspecified                           K21.9, Gastro-esophageal reflux disease without                            esophagitis                           R93.3, Abnormal findings on diagnostic imaging of                            other parts of digestive tract CPT copyright 2016 American Medical Association. All rights reserved. The codes documented in this report are preliminary and upon coder review may  be revised to meet current compliance requirements. Ladene Artist, MD 09/14/2016 11:53:42 AM This report has been signed electronically. Number of Addenda: 0

## 2016-09-16 ENCOUNTER — Encounter (HOSPITAL_COMMUNITY): Payer: Self-pay | Admitting: Gastroenterology

## 2016-09-20 NOTE — Addendum Note (Signed)
Addendum  created 09/20/16 1459 by Tashayla Therien, MD   Sign clinical note    

## 2016-09-24 ENCOUNTER — Other Ambulatory Visit: Payer: Self-pay | Admitting: Family

## 2016-09-24 DIAGNOSIS — D649 Anemia, unspecified: Secondary | ICD-10-CM

## 2016-09-27 ENCOUNTER — Ambulatory Visit: Payer: Medicare Other

## 2016-09-27 ENCOUNTER — Ambulatory Visit (HOSPITAL_BASED_OUTPATIENT_CLINIC_OR_DEPARTMENT_OTHER): Payer: Medicare Other | Admitting: Family

## 2016-09-27 ENCOUNTER — Other Ambulatory Visit (HOSPITAL_BASED_OUTPATIENT_CLINIC_OR_DEPARTMENT_OTHER): Payer: Medicare Other

## 2016-09-27 DIAGNOSIS — K219 Gastro-esophageal reflux disease without esophagitis: Secondary | ICD-10-CM | POA: Diagnosis not present

## 2016-09-27 DIAGNOSIS — D509 Iron deficiency anemia, unspecified: Secondary | ICD-10-CM | POA: Diagnosis not present

## 2016-09-27 DIAGNOSIS — Z85820 Personal history of malignant melanoma of skin: Secondary | ICD-10-CM | POA: Diagnosis not present

## 2016-09-27 DIAGNOSIS — Z803 Family history of malignant neoplasm of breast: Secondary | ICD-10-CM | POA: Diagnosis not present

## 2016-09-27 DIAGNOSIS — R609 Edema, unspecified: Secondary | ICD-10-CM

## 2016-09-27 DIAGNOSIS — Z86711 Personal history of pulmonary embolism: Secondary | ICD-10-CM | POA: Diagnosis not present

## 2016-09-27 DIAGNOSIS — D649 Anemia, unspecified: Secondary | ICD-10-CM

## 2016-09-27 LAB — CBC WITH DIFFERENTIAL/PLATELET
BASO%: 0.6 % (ref 0.0–2.0)
Basophils Absolute: 0 10*3/uL (ref 0.0–0.1)
EOS%: 2.3 % (ref 0.0–7.0)
Eosinophils Absolute: 0.1 10*3/uL (ref 0.0–0.5)
HCT: 38.4 % (ref 34.8–46.6)
HGB: 12.4 g/dL (ref 11.6–15.9)
LYMPH%: 24 % (ref 14.0–49.7)
MCH: 28.8 pg (ref 25.1–34.0)
MCHC: 32.3 g/dL (ref 31.5–36.0)
MCV: 89.1 fL (ref 79.5–101.0)
MONO#: 0.7 10*3/uL (ref 0.1–0.9)
MONO%: 11.6 % (ref 0.0–14.0)
NEUT#: 3.8 10*3/uL (ref 1.5–6.5)
NEUT%: 61.5 % (ref 38.4–76.8)
Platelets: 263 10*3/uL (ref 145–400)
RBC: 4.31 10*6/uL (ref 3.70–5.45)
RDW: 17.3 % — ABNORMAL HIGH (ref 11.2–14.5)
WBC: 6.2 10*3/uL (ref 3.9–10.3)
lymph#: 1.5 10*3/uL (ref 0.9–3.3)
nRBC: 0 % (ref 0–0)

## 2016-09-27 LAB — COMPREHENSIVE METABOLIC PANEL
ALT: 10 U/L (ref 0–55)
AST: 18 U/L (ref 5–34)
Albumin: 3.6 g/dL (ref 3.5–5.0)
Alkaline Phosphatase: 75 U/L (ref 40–150)
Anion Gap: 10 mEq/L (ref 3–11)
BUN: 13 mg/dL (ref 7.0–26.0)
CHLORIDE: 107 meq/L (ref 98–109)
CO2: 23 mEq/L (ref 22–29)
Calcium: 9.8 mg/dL (ref 8.4–10.4)
Creatinine: 0.8 mg/dL (ref 0.6–1.1)
EGFR: 72 mL/min/{1.73_m2} — AB (ref >90)
GLUCOSE: 93 mg/dL (ref 70–140)
POTASSIUM: 4 meq/L (ref 3.5–5.1)
SODIUM: 140 meq/L (ref 136–145)
Total Bilirubin: 0.51 mg/dL (ref 0.20–1.20)
Total Protein: 6.7 g/dL (ref 6.4–8.3)

## 2016-09-27 LAB — CHCC SATELLITE - SMEAR
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
## 2016-09-28 ENCOUNTER — Telehealth: Payer: Self-pay | Admitting: *Deleted

## 2016-09-28 DIAGNOSIS — D509 Iron deficiency anemia, unspecified: Secondary | ICD-10-CM

## 2016-09-28 HISTORY — DX: Iron deficiency anemia, unspecified: D50.9

## 2016-09-28 LAB — FERRITIN: FERRITIN: 34 ng/mL (ref 9–269)

## 2016-09-28 LAB — IRON AND TIBC
%SAT: 20 % — ABNORMAL LOW (ref 21–57)
Iron: 75 ug/dL (ref 41–142)
TIBC: 381 ug/dL (ref 236–444)
UIBC: 306 ug/dL (ref 120–384)

## 2016-09-28 LAB — LACTATE DEHYDROGENASE: LDH: 178 U/L (ref 125–245)

## 2016-09-28 LAB — RETICULOCYTES: Reticulocyte Count: 1 % (ref 0.6–2.6)

## 2016-09-28 LAB — ERYTHROPOIETIN: ERYTHROPOIETIN: 27.4 m[IU]/mL — AB (ref 2.6–18.5)

## 2016-09-28 NOTE — Telephone Encounter (Addendum)
Message left on voice mail x 2  ----- Message from Eliezer Bottom, NP sent at 09/28/2016  9:55 AM EDT ----- Regarding: Iron  Iron studies are improving! Continue on oral iron supplement. Thank you!  Sarah  ----- Message ----- From: Interface, Lab In Three Zero One Sent: 09/27/2016   2:20 PM To: Eliezer Bottom, NP

## 2016-12-12 ENCOUNTER — Emergency Department (HOSPITAL_COMMUNITY): Payer: Medicare Other

## 2016-12-12 ENCOUNTER — Encounter (HOSPITAL_COMMUNITY): Payer: Self-pay | Admitting: Nurse Practitioner

## 2016-12-12 ENCOUNTER — Inpatient Hospital Stay (HOSPITAL_COMMUNITY)
Admission: EM | Admit: 2016-12-12 | Discharge: 2016-12-15 | DRG: 872 | Disposition: A | Discharge status: Home / Self Care | Payer: Medicare Other | Attending: Internal Medicine | Admitting: Internal Medicine

## 2016-12-12 DIAGNOSIS — Z79899 Other long term (current) drug therapy: Secondary | ICD-10-CM

## 2016-12-12 DIAGNOSIS — Z9981 Dependence on supplemental oxygen: Secondary | ICD-10-CM

## 2016-12-12 DIAGNOSIS — K440 Diaphragmatic hernia with obstruction, without gangrene: Secondary | ICD-10-CM

## 2016-12-12 DIAGNOSIS — R17 Unspecified jaundice: Secondary | ICD-10-CM | POA: Diagnosis present

## 2016-12-12 DIAGNOSIS — R1013 Epigastric pain: Secondary | ICD-10-CM | POA: Diagnosis not present

## 2016-12-12 DIAGNOSIS — K7689 Other specified diseases of liver: Secondary | ICD-10-CM | POA: Diagnosis present

## 2016-12-12 DIAGNOSIS — R945 Abnormal results of liver function studies: Secondary | ICD-10-CM | POA: Diagnosis present

## 2016-12-12 DIAGNOSIS — I110 Hypertensive heart disease with heart failure: Secondary | ICD-10-CM | POA: Diagnosis present

## 2016-12-12 DIAGNOSIS — Z882 Allergy status to sulfonamides status: Secondary | ICD-10-CM

## 2016-12-12 DIAGNOSIS — Z888 Allergy status to other drugs, medicaments and biological substances status: Secondary | ICD-10-CM

## 2016-12-12 DIAGNOSIS — Z881 Allergy status to other antibiotic agents status: Secondary | ICD-10-CM

## 2016-12-12 DIAGNOSIS — R109 Unspecified abdominal pain: Secondary | ICD-10-CM | POA: Diagnosis present

## 2016-12-12 DIAGNOSIS — Z86711 Personal history of pulmonary embolism: Secondary | ICD-10-CM

## 2016-12-12 DIAGNOSIS — A419 Sepsis, unspecified organism: Secondary | ICD-10-CM | POA: Diagnosis present

## 2016-12-12 DIAGNOSIS — Z885 Allergy status to narcotic agent status: Secondary | ICD-10-CM

## 2016-12-12 DIAGNOSIS — Z7982 Long term (current) use of aspirin: Secondary | ICD-10-CM

## 2016-12-12 DIAGNOSIS — Z7983 Long term (current) use of bisphosphonates: Secondary | ICD-10-CM

## 2016-12-12 DIAGNOSIS — R112 Nausea with vomiting, unspecified: Secondary | ICD-10-CM | POA: Diagnosis present

## 2016-12-12 DIAGNOSIS — K449 Diaphragmatic hernia without obstruction or gangrene: Secondary | ICD-10-CM | POA: Diagnosis present

## 2016-12-12 DIAGNOSIS — R933 Abnormal findings on diagnostic imaging of other parts of digestive tract: Secondary | ICD-10-CM

## 2016-12-12 DIAGNOSIS — Z9849 Cataract extraction status, unspecified eye: Secondary | ICD-10-CM

## 2016-12-12 DIAGNOSIS — D509 Iron deficiency anemia, unspecified: Secondary | ICD-10-CM | POA: Diagnosis present

## 2016-12-12 DIAGNOSIS — E162 Hypoglycemia, unspecified: Secondary | ICD-10-CM | POA: Diagnosis not present

## 2016-12-12 DIAGNOSIS — Z90710 Acquired absence of both cervix and uterus: Secondary | ICD-10-CM

## 2016-12-12 DIAGNOSIS — R7989 Other specified abnormal findings of blood chemistry: Secondary | ICD-10-CM | POA: Diagnosis present

## 2016-12-12 DIAGNOSIS — Z6830 Body mass index (BMI) 30.0-30.9, adult: Secondary | ICD-10-CM

## 2016-12-12 DIAGNOSIS — Z8249 Family history of ischemic heart disease and other diseases of the circulatory system: Secondary | ICD-10-CM

## 2016-12-12 DIAGNOSIS — J9611 Chronic respiratory failure with hypoxia: Secondary | ICD-10-CM | POA: Diagnosis present

## 2016-12-12 DIAGNOSIS — E669 Obesity, unspecified: Secondary | ICD-10-CM | POA: Diagnosis present

## 2016-12-12 DIAGNOSIS — R1310 Dysphagia, unspecified: Secondary | ICD-10-CM | POA: Diagnosis present

## 2016-12-12 DIAGNOSIS — I5032 Chronic diastolic (congestive) heart failure: Secondary | ICD-10-CM | POA: Diagnosis present

## 2016-12-12 DIAGNOSIS — K219 Gastro-esophageal reflux disease without esophagitis: Secondary | ICD-10-CM | POA: Diagnosis present

## 2016-12-12 DIAGNOSIS — R748 Abnormal levels of other serum enzymes: Secondary | ICD-10-CM

## 2016-12-12 DIAGNOSIS — I10 Essential (primary) hypertension: Secondary | ICD-10-CM | POA: Diagnosis present

## 2016-12-12 DIAGNOSIS — R1110 Vomiting, unspecified: Secondary | ICD-10-CM

## 2016-12-12 LAB — I-STAT CG4 LACTIC ACID, ED
LACTIC ACID, VENOUS: 2.24 mmol/L — AB (ref 0.5–1.9)
Lactic Acid, Venous: 0.74 mmol/L (ref 0.5–1.9)

## 2016-12-12 LAB — COMPREHENSIVE METABOLIC PANEL
ALT: 194 U/L — AB (ref 14–54)
Albumin: 3.6 g/dL (ref 3.5–5.0)
Alkaline Phosphatase: 358 U/L — ABNORMAL HIGH (ref 38–126)
Anion gap: 11 (ref 5–15)
BILIRUBIN TOTAL: 2.6 mg/dL — AB (ref 0.3–1.2)
BUN: 19 mg/dL (ref 6–20)
CALCIUM: 8.9 mg/dL (ref 8.9–10.3)
CO2: 21 mmol/L — ABNORMAL LOW (ref 22–32)
CREATININE: 1 mg/dL (ref 0.44–1.00)
Chloride: 105 mmol/L (ref 101–111)
GFR calc Af Amer: >60 mL/min (ref >60)
GFR, EST NON AFRICAN AMERICAN: 52 mL/min — AB (ref >60)
Glucose, Bld: 180 mg/dL — ABNORMAL HIGH (ref 65–99)
Potassium: 3.6 mmol/L (ref 3.5–5.1)
Sodium: 137 mmol/L (ref 135–145)
TOTAL PROTEIN: 6.8 g/dL (ref 6.5–8.1)

## 2016-12-12 LAB — URINALYSIS, ROUTINE W REFLEX MICROSCOPIC
Glucose, UA: NEGATIVE mg/dL
KETONES UR: NEGATIVE mg/dL
Leukocytes, UA: NEGATIVE
Nitrite: NEGATIVE
PH: 5.5 (ref 5.0–8.0)
Protein, ur: 30 mg/dL — AB

## 2016-12-12 LAB — CBC WITH DIFFERENTIAL/PLATELET
BASOS PCT: 0 %
Basophils Absolute: 0 10*3/uL (ref 0.0–0.1)
Eosinophils Absolute: 0 10*3/uL (ref 0.0–0.7)
Eosinophils Relative: 0 %
HCT: 32.9 % — ABNORMAL LOW (ref 36.0–46.0)
HEMOGLOBIN: 11.1 g/dL — AB (ref 12.0–15.0)
Lymphocytes Relative: 9 %
Lymphs Abs: 0.9 10*3/uL (ref 0.7–4.0)
MCH: 30.4 pg (ref 26.0–34.0)
MCHC: 33.7 g/dL (ref 30.0–36.0)
MCV: 90.1 fL (ref 78.0–100.0)
MONOS PCT: 6 %
Monocytes Absolute: 0.6 10*3/uL (ref 0.1–1.0)
NEUTROS ABS: 9.1 10*3/uL — AB (ref 1.7–7.7)
NEUTROS PCT: 85 %
Platelets: 236 10*3/uL (ref 150–400)
RBC: 3.65 MIL/uL — ABNORMAL LOW (ref 3.87–5.11)
RDW: 15 % (ref 11.5–15.5)
WBC: 10.7 10*3/uL — ABNORMAL HIGH (ref 4.0–10.5)

## 2016-12-12 LAB — LIPASE, BLOOD: Lipase: 40 U/L (ref 11–51)

## 2016-12-12 MED ORDER — SODIUM CHLORIDE 0.9 % IV BOLUS (SEPSIS)
1000.0000 mL | Freq: Once | INTRAVENOUS | Status: AC
  Administered 2016-12-12: 1000 mL via INTRAVENOUS

## 2016-12-12 MED ORDER — PROMETHAZINE HCL 25 MG/ML IJ SOLN
12.5000 mg | Freq: Once | INTRAMUSCULAR | Status: AC
  Administered 2016-12-12: 12.5 mg via INTRAVENOUS
  Filled 2016-12-12: qty 1

## 2016-12-12 MED ORDER — ONDANSETRON HCL 4 MG/2ML IJ SOLN: 4.0000 mg | Freq: Once | INTRAMUSCULAR | Status: DC

## 2016-12-12 MED ORDER — IOPAMIDOL (ISOVUE-300) INJECTION 61%
INTRAVENOUS | Status: AC
  Administered 2016-12-12: 100 mL via INTRAVENOUS
  Administered 2016-12-12: 22:00:00
  Filled 2016-12-12: qty 100

## 2016-12-12 MED ORDER — FENTANYL CITRATE (PF) 100 MCG/2ML IJ SOLN
50.0000 ug | Freq: Once | INTRAMUSCULAR | Status: AC
  Administered 2016-12-12: 50 ug via INTRAVENOUS
  Filled 2016-12-12: qty 2
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
## 2016-12-12 NOTE — ED Notes (Signed)
Please keep daughter Lovey Newcomer updated on [phone].

## 2016-12-12 NOTE — ED Triage Notes (Signed)
Pt is c/o upper quadrants abdominal pain since this morning and "non-stop vomiting."

## 2016-12-12 NOTE — ED Provider Notes (Signed)
Care assumed from Martinique Russo, [place].  Please see her full H&P.  [name] is a 82 y.o. female presents with abdominal pain and vomiting after lunch today.  Here in the ED she had significant tenderness in the epigastrium and LLQ.  Her vomiting was controlled with phenergan.  Her labs are abnormal and her CT shows large hiatal hernia with pneumobilia.    Physical Exam  BP 135/60 (BP Location: Right Arm)   Pulse 80   Temp 97.7 F (36.5 C) (Oral)   Resp 20   SpO2 97%   Physical Exam  Constitutional: She appears well-developed and well-nourished. No distress.  HENT:  Head: Normocephalic.  Eyes: Conjunctivae are normal. No scleral icterus.  Neck: Normal range of motion.  Cardiovascular: Normal rate.   Pulmonary/Chest: Effort normal.  Musculoskeletal: Normal range of motion.  Neurological: She is alert.  Skin: Skin is warm and dry.  Nursing note and vitals reviewed.  Results for orders placed or performed during the hospital encounter of 12/12/16  Lipase, blood  Result Value Ref Range   Lipase 40 11 - 51 U/L  Comprehensive metabolic panel  Result Value Ref Range   Sodium 137 135 - 145 mmol/L   Potassium 3.6 3.5 - 5.1 mmol/L   Chloride 105 101 - 111 mmol/L   CO2 21 (L) 22 - 32 mmol/L   Glucose, Bld 180 (H) 65 - 99 mg/dL   BUN 19 6 - 20 mg/dL   Creatinine, Ser 1.00 0.44 - 1.00 mg/dL   Calcium 8.9 8.9 - 10.3 mg/dL   Total Protein 6.8 6.5 - 8.1 g/dL   Albumin 3.6 3.5 - 5.0 g/dL   AST <5 (L) 15 - 41 U/L   ALT 194 (H) 14 - 54 U/L   Alkaline Phosphatase 358 (H) 38 - 126 U/L   Total Bilirubin 2.6 (H) 0.3 - 1.2 mg/dL   GFR calc non Af Amer 52 (L) >60 mL/min   GFR calc Af Amer >60 >60 mL/min   Anion gap 11 5 - 15  Urinalysis, Routine w reflex microscopic  Result Value Ref Range   Color, Urine YELLOW YELLOW   APPearance CLEAR CLEAR   Specific Gravity, Urine >1.030 (H) 1.005 - 1.030   pH 5.5 5.0 - 8.0   Glucose, UA NEGATIVE NEGATIVE mg/dL   Hgb urine dipstick LARGE (A)  NEGATIVE   Bilirubin Urine SMALL (A) NEGATIVE   Ketones, ur NEGATIVE NEGATIVE mg/dL   Protein, ur 30 (A) NEGATIVE mg/dL   Nitrite NEGATIVE NEGATIVE   Leukocytes, UA NEGATIVE NEGATIVE   RBC / HPF TOO NUMEROUS TO COUNT 0 - 5 RBC/hpf   WBC, UA 6-30 0 - 5 WBC/hpf   Bacteria, UA MANY (A) NONE SEEN   Squamous Epithelial / LPF 0-5 (A) NONE SEEN   Mucus PRESENT    Hyaline Casts, UA PRESENT   CBC with Differential  Result Value Ref Range   WBC 10.7 (H) 4.0 - 10.5 K/uL   RBC 3.65 (L) 3.87 - 5.11 MIL/uL   Hemoglobin 11.1 (L) 12.0 - 15.0 g/dL   HCT 32.9 (L) 36.0 - 46.0 %   MCV 90.1 78.0 - 100.0 fL   MCH 30.4 26.0 - 34.0 pg   MCHC 33.7 30.0 - 36.0 g/dL   RDW 15.0 11.5 - 15.5 %   Platelets 236 150 - 400 K/uL   Neutrophils Relative % 85 %   Neutro Abs 9.1 (H) 1.7 - 7.7 K/uL   Lymphocytes Relative 9 %  Lymphs Abs 0.9 0.7 - 4.0 K/uL   Monocytes Relative 6 %   Monocytes Absolute 0.6 0.1 - 1.0 K/uL   Eosinophils Relative 0 %   Eosinophils Absolute 0.0 0.0 - 0.7 K/uL   Basophils Relative 0 %   Basophils Absolute 0.0 0.0 - 0.1 K/uL   Smear Review RARE NRBCs   Ammonia  Result Value Ref Range   Ammonia 40 (H) 9 - 35 umol/L  Protime-INR  Result Value Ref Range   Prothrombin Time 13.6 11.4 - 15.2 seconds   INR 1.04   Hepatic function panel  Result Value Ref Range   Total Protein 6.2 (L) 6.5 - 8.1 g/dL   Albumin 3.2 (L) 3.5 - 5.0 g/dL   AST 308 (H) 15 - 41 U/L   ALT 196 (H) 14 - 54 U/L   Alkaline Phosphatase 278 (H) 38 - 126 U/L   Total Bilirubin 3.0 (H) 0.3 - 1.2 mg/dL   Bilirubin, Direct 1.2 (H) 0.1 - 0.5 mg/dL   Indirect Bilirubin 1.8 (H) 0.3 - 0.9 mg/dL  Brain natriuretic peptide  Result Value Ref Range   B Natriuretic Peptide 94.7 0.0 - 100.0 pg/mL  APTT  Result Value Ref Range   aPTT 29 24 - 36 seconds  Lactic acid, plasma  Result Value Ref Range   Lactic Acid, Venous 0.6 0.5 - 1.9 mmol/L  Procalcitonin  Result Value Ref Range   Procalcitonin 8.04 ng/mL  Comprehensive  metabolic panel  Result Value Ref Range   Sodium 139 135 - 145 mmol/L   Potassium 3.4 (L) 3.5 - 5.1 mmol/L   Chloride 108 101 - 111 mmol/L   CO2 24 22 - 32 mmol/L   Glucose, Bld 122 (H) 65 - 99 mg/dL   BUN 15 6 - 20 mg/dL   Creatinine, Ser 0.88 0.44 - 1.00 mg/dL   Calcium 8.1 (L) 8.9 - 10.3 mg/dL   Total Protein 5.9 (L) 6.5 - 8.1 g/dL   Albumin 3.1 (L) 3.5 - 5.0 g/dL   AST 278 (H) 15 - 41 U/L   ALT 210 (H) 14 - 54 U/L   Alkaline Phosphatase 263 (H) 38 - 126 U/L   Total Bilirubin 2.9 (H) 0.3 - 1.2 mg/dL   GFR calc non Af Amer >60 >60 mL/min   GFR calc Af Amer >60 >60 mL/min   Anion gap 7 5 - 15  CBC with Differential/Platelet  Result Value Ref Range   WBC 5.8 4.0 - 10.5 K/uL   RBC 3.13 (L) 3.87 - 5.11 MIL/uL   Hemoglobin 9.8 (L) 12.0 - 15.0 g/dL   HCT 28.8 (L) 36.0 - 46.0 %   MCV 92.0 78.0 - 100.0 fL   MCH 31.3 26.0 - 34.0 pg   MCHC 34.0 30.0 - 36.0 g/dL   RDW 15.3 11.5 - 15.5 %   Platelets 228 150 - 400 K/uL   Neutrophils Relative % 69 %   Neutro Abs 4.1 1.7 - 7.7 K/uL   Lymphocytes Relative 16 %   Lymphs Abs 0.9 0.7 - 4.0 K/uL   Monocytes Relative 14 %   Monocytes Absolute 0.8 0.1 - 1.0 K/uL   Eosinophils Relative 1 %   Eosinophils Absolute 0.0 0.0 - 0.7 K/uL   Basophils Relative 0 %   Basophils Absolute 0.0 0.0 - 0.1 K/uL  I-Stat CG4 Lactic Acid, ED  Result Value Ref Range   Lactic Acid, Venous 2.24 (HH) 0.5 - 1.9 mmol/L   Comment NOTIFIED PHYSICIAN   I-Stat  CG4 Lactic Acid, ED  Result Value Ref Range   Lactic Acid, Venous 0.74 0.5 - 1.9 mmol/L   Ct Abdomen Pelvis W Contrast  Result Date: 12/12/2016 CLINICAL DATA:  Vomiting and upper abdominal pain beginning today. History of hiatal hernia. EXAM: CT ABDOMEN AND PELVIS WITH CONTRAST TECHNIQUE: Multidetector CT imaging of the abdomen and pelvis was performed using the standard protocol following bolus administration of intravenous contrast. CONTRAST:  <See Chart> ISOVUE-300 IOPAMIDOL (ISOVUE-300) INJECTION 61%  COMPARISON:  None. FINDINGS: LOWER CHEST: RIGHT lung base compressive atelectasis due to hiatal hernia. Included heart size is normal. No pericardial effusion. HEPATOBILIARY: 11 mm cyst RIGHT lobe of the liver, minimal intrahepatic pneumobilia. Status post cholecystectomy. PANCREAS: Normal. SPLEEN: Normal. ADRENALS/URINARY TRACT: Kidneys are orthotopic, demonstrating symmetric enhancement. No nephrolithiasis, hydronephrosis or solid renal masses. Too small to characterize hypodensity LEFT interpolar kidney. The unopacified ureters are normal in course and caliber. Delayed imaging through the kidneys demonstrates symmetric prompt contrast excretion within the proximal urinary collecting system. Urinary bladder is well distended and unremarkable. Normal adrenal glands. STOMACH/BOWEL: Large hiatal hernia small amount of free fluid within the posterior hernia sac with fat stranding (axial 1/93). The small and large bowel are normal in course and caliber without inflammatory changes, limited assessment without contrast. Moderate sigmoid colonic diverticulosis with a few additional scattered diverticula. Distal duodenum gas distended diverticulum. Normal appendix. VASCULAR/LYMPHATIC: Aortoiliac vessels are normal in course and caliber. Moderate calcific atherosclerosis. No lymphadenopathy by CT size criteria. REPRODUCTIVE: Normal. OTHER: No intraperitoneal free fluid or free air. MUSCULOSKELETAL: Nonacute. Small fat containing umbilical hernia. Minimal grade 1 L4-5 anterolisthesis on degenerative basis. IMPRESSION: 1. Large hiatal hernia. Nonspecific free fluid and inflammation within hernia sac (possible gastritis), incompletely assessed. 2. Diverticulosis without acute diverticulitis nor acute intra-abdominal/pelvic process. Aortic Atherosclerosis (ICD10-I70.0). Electronically Signed   By: Elon Alas M.D.   On: 12/12/2016 22:23   US Abdomen Limited Ruq  Result Date: 12/13/2016 CLINICAL DATA:  Right upper  quadrant abdominal pain EXAM: ULTRASOUND ABDOMEN LIMITED RIGHT UPPER QUADRANT COMPARISON:  CT abdomen pelvis 12/12/2016 FINDINGS: Gallbladder: The gallbladder is surgically absent. Common bile duct: Diameter: 9 mm Liver: Increased hepatic echogenicity suggest steatosis, though this is not confirmed on the concomitant CT. Portal vein is patent on color Doppler imaging with normal direction of blood flow towards the liver. IMPRESSION: No acute ultrasonographic abnormality of the right upper quadrant, status post cholecystectomy. Electronically Signed   By: Ulyses Jarred M.D.   On: 12/13/2016 03:37     ED Course  Procedures  Clinical Course as of Dec 13 536  Sun Dec 12, 2016  2309 Plan: Repeat hepatic function panel pending along with additional labs. Patient is receiving fluids. Her pain is controlled at this time. Anticipate admission.  [HM]  Mon Dec 13, 2016  0100 Elevated AST and ALT. Also with elevated ammonia.  [HM]  0135 Discussed with Dr. Blaine Hamper who will admit but requests surgical consult  [HM]    Clinical Course User Index [HM] Arvis Zwahlen, Gwenlyn Perking     MDM Pt admitted for nausea, vomiting, epigastric abd pain and elevated liver enzymes.  She has a small area of pneumobilia. Pt admitted to [facility] who will consult GI in the AM.  Pt resting comfortably at time of admission.    The patient was discussed with and seen by Dr. Sherry Ruffing who agrees with the treatment plan.    Non-intractable vomiting, presence of nausea not specified, unspecified vomiting type  Epigastric pain  Abdominal pain -  Plan: US Abdomen Limited RUQ, US Abdomen Limited RUQ  Elevated liver enzymes      Agapito Games 12/13/16 7460    Tegeler, Gwenyth Allegra, MD 12/13/16 1024

## 2016-12-13 ENCOUNTER — Inpatient Hospital Stay (HOSPITAL_COMMUNITY): Payer: Medicare Other

## 2016-12-13 DIAGNOSIS — R109 Unspecified abdominal pain: Secondary | ICD-10-CM

## 2016-12-13 DIAGNOSIS — R945 Abnormal results of liver function studies: Secondary | ICD-10-CM

## 2016-12-13 DIAGNOSIS — R748 Abnormal levels of other serum enzymes: Secondary | ICD-10-CM | POA: Diagnosis not present

## 2016-12-13 DIAGNOSIS — K7689 Other specified diseases of liver: Secondary | ICD-10-CM | POA: Diagnosis present

## 2016-12-13 DIAGNOSIS — Z8249 Family history of ischemic heart disease and other diseases of the circulatory system: Secondary | ICD-10-CM | POA: Diagnosis not present

## 2016-12-13 DIAGNOSIS — R1013 Epigastric pain: Secondary | ICD-10-CM

## 2016-12-13 DIAGNOSIS — R7989 Other specified abnormal findings of blood chemistry: Secondary | ICD-10-CM

## 2016-12-13 DIAGNOSIS — Z79899 Other long term (current) drug therapy: Secondary | ICD-10-CM | POA: Diagnosis not present

## 2016-12-13 DIAGNOSIS — J9611 Chronic respiratory failure with hypoxia: Secondary | ICD-10-CM | POA: Diagnosis present

## 2016-12-13 DIAGNOSIS — R112 Nausea with vomiting, unspecified: Secondary | ICD-10-CM

## 2016-12-13 DIAGNOSIS — Z881 Allergy status to other antibiotic agents status: Secondary | ICD-10-CM | POA: Diagnosis not present

## 2016-12-13 DIAGNOSIS — I110 Hypertensive heart disease with heart failure: Secondary | ICD-10-CM | POA: Diagnosis present

## 2016-12-13 DIAGNOSIS — R1310 Dysphagia, unspecified: Secondary | ICD-10-CM | POA: Diagnosis present

## 2016-12-13 DIAGNOSIS — Z888 Allergy status to other drugs, medicaments and biological substances status: Secondary | ICD-10-CM | POA: Diagnosis not present

## 2016-12-13 DIAGNOSIS — K449 Diaphragmatic hernia without obstruction or gangrene: Secondary | ICD-10-CM | POA: Insufficient documentation

## 2016-12-13 DIAGNOSIS — Z7982 Long term (current) use of aspirin: Secondary | ICD-10-CM | POA: Diagnosis not present

## 2016-12-13 DIAGNOSIS — A419 Sepsis, unspecified organism: Secondary | ICD-10-CM | POA: Diagnosis not present

## 2016-12-13 DIAGNOSIS — K219 Gastro-esophageal reflux disease without esophagitis: Secondary | ICD-10-CM | POA: Diagnosis present

## 2016-12-13 DIAGNOSIS — E162 Hypoglycemia, unspecified: Secondary | ICD-10-CM | POA: Diagnosis not present

## 2016-12-13 DIAGNOSIS — D509 Iron deficiency anemia, unspecified: Secondary | ICD-10-CM

## 2016-12-13 DIAGNOSIS — I519 Heart disease, unspecified: Secondary | ICD-10-CM | POA: Diagnosis not present

## 2016-12-13 DIAGNOSIS — R1084 Generalized abdominal pain: Secondary | ICD-10-CM | POA: Diagnosis not present

## 2016-12-13 DIAGNOSIS — K440 Diaphragmatic hernia with obstruction, without gangrene: Secondary | ICD-10-CM

## 2016-12-13 DIAGNOSIS — Z882 Allergy status to sulfonamides status: Secondary | ICD-10-CM | POA: Diagnosis not present

## 2016-12-13 DIAGNOSIS — Z885 Allergy status to narcotic agent status: Secondary | ICD-10-CM | POA: Diagnosis not present

## 2016-12-13 DIAGNOSIS — Z90710 Acquired absence of both cervix and uterus: Secondary | ICD-10-CM | POA: Diagnosis not present

## 2016-12-13 DIAGNOSIS — Z6830 Body mass index (BMI) 30.0-30.9, adult: Secondary | ICD-10-CM | POA: Diagnosis not present

## 2016-12-13 DIAGNOSIS — E669 Obesity, unspecified: Secondary | ICD-10-CM | POA: Diagnosis present

## 2016-12-13 DIAGNOSIS — I10 Essential (primary) hypertension: Secondary | ICD-10-CM | POA: Diagnosis not present

## 2016-12-13 DIAGNOSIS — R1011 Right upper quadrant pain: Secondary | ICD-10-CM

## 2016-12-13 DIAGNOSIS — I5032 Chronic diastolic (congestive) heart failure: Secondary | ICD-10-CM | POA: Diagnosis not present

## 2016-12-13 DIAGNOSIS — Z7983 Long term (current) use of bisphosphonates: Secondary | ICD-10-CM | POA: Diagnosis not present

## 2016-12-13 DIAGNOSIS — Z9981 Dependence on supplemental oxygen: Secondary | ICD-10-CM | POA: Diagnosis not present

## 2016-12-13 DIAGNOSIS — R17 Unspecified jaundice: Secondary | ICD-10-CM | POA: Diagnosis present

## 2016-12-13 DIAGNOSIS — R933 Abnormal findings on diagnostic imaging of other parts of digestive tract: Secondary | ICD-10-CM | POA: Diagnosis not present

## 2016-12-13 HISTORY — DX: Sepsis, unspecified organism: A41.9

## 2016-12-13 HISTORY — DX: Chronic diastolic (congestive) heart failure: I50.32

## 2016-12-13 HISTORY — DX: Unspecified abdominal pain: R10.9

## 2016-12-13 HISTORY — DX: Iron deficiency anemia, unspecified: D50.9

## 2016-12-13 HISTORY — DX: Diaphragmatic hernia with obstruction, without gangrene: K44.0

## 2016-12-13 HISTORY — DX: Other specified abnormal findings of blood chemistry: R79.89

## 2016-12-13 HISTORY — DX: Diaphragmatic hernia without obstruction or gangrene: K44.9

## 2016-12-13 LAB — HEPATIC FUNCTION PANEL
ALBUMIN: 3.2 g/dL — AB (ref 3.5–5.0)
ALK PHOS: 278 U/L — AB (ref 38–126)
ALT: 196 U/L — AB (ref 14–54)
AST: 308 U/L — AB (ref 15–41)
BILIRUBIN TOTAL: 3 mg/dL — AB (ref 0.3–1.2)
Bilirubin, Direct: 1.2 mg/dL — ABNORMAL HIGH (ref 0.1–0.5)
Indirect Bilirubin: 1.8 mg/dL — ABNORMAL HIGH (ref 0.3–0.9)
TOTAL PROTEIN: 6.2 g/dL — AB (ref 6.5–8.1)

## 2016-12-13 LAB — CBC WITH DIFFERENTIAL/PLATELET
BASOS PCT: 0 %
Basophils Absolute: 0 10*3/uL (ref 0.0–0.1)
Eosinophils Absolute: 0 10*3/uL (ref 0.0–0.7)
Eosinophils Relative: 1 %
HCT: 28.8 % — ABNORMAL LOW (ref 36.0–46.0)
HEMOGLOBIN: 9.8 g/dL — AB (ref 12.0–15.0)
LYMPHS PCT: 16 %
Lymphs Abs: 0.9 10*3/uL (ref 0.7–4.0)
MCH: 31.3 pg (ref 26.0–34.0)
MCHC: 34 g/dL (ref 30.0–36.0)
MCV: 92 fL (ref 78.0–100.0)
MONO ABS: 0.8 10*3/uL (ref 0.1–1.0)
MONOS PCT: 14 %
NEUTROS ABS: 4.1 10*3/uL (ref 1.7–7.7)
NEUTROS PCT: 69 %
Platelets: 228 10*3/uL (ref 150–400)
RBC: 3.13 MIL/uL — ABNORMAL LOW (ref 3.87–5.11)
RDW: 15.3 % (ref 11.5–15.5)
WBC: 5.8 10*3/uL (ref 4.0–10.5)

## 2016-12-13 LAB — COMPREHENSIVE METABOLIC PANEL
ALK PHOS: 263 U/L — AB (ref 38–126)
ALT: 210 U/L — ABNORMAL HIGH (ref 14–54)
ANION GAP: 7 (ref 5–15)
AST: 278 U/L — ABNORMAL HIGH (ref 15–41)
Albumin: 3.1 g/dL — ABNORMAL LOW (ref 3.5–5.0)
BILIRUBIN TOTAL: 2.9 mg/dL — AB (ref 0.3–1.2)
BUN: 15 mg/dL (ref 6–20)
CALCIUM: 8.1 mg/dL — AB (ref 8.9–10.3)
CO2: 24 mmol/L (ref 22–32)
Chloride: 108 mmol/L (ref 101–111)
Creatinine, Ser: 0.88 mg/dL (ref 0.44–1.00)
GFR calc non Af Amer: >60 mL/min (ref >60)
GLUCOSE: 122 mg/dL — AB (ref 65–99)
Potassium: 3.4 mmol/L — ABNORMAL LOW (ref 3.5–5.1)
Sodium: 139 mmol/L (ref 135–145)
TOTAL PROTEIN: 5.9 g/dL — AB (ref 6.5–8.1)

## 2016-12-13 LAB — TYPE AND SCREEN
ABO/RH(D): B NEG
ANTIBODY SCREEN: NEGATIVE

## 2016-12-13 LAB — AMMONIA: AMMONIA: 40 umol/L — AB (ref 9–35)

## 2016-12-13 LAB — PROCALCITONIN: Procalcitonin: 8.04 ng/mL

## 2016-12-13 LAB — LACTIC ACID, PLASMA: Lactic Acid, Venous: 0.6 mmol/L (ref 0.5–1.9)

## 2016-12-13 LAB — BRAIN NATRIURETIC PEPTIDE: B NATRIURETIC PEPTIDE 5: 94.7 pg/mL (ref 0.0–100.0)

## 2016-12-13 LAB — PROTIME-INR
INR: 1.04
Prothrombin Time: 13.6 seconds (ref 11.4–15.2)

## 2016-12-13 LAB — APTT: APTT: 29 s (ref 24–36)

## 2016-12-13 LAB — ABO/RH: ABO/RH(D): B NEG

## 2016-12-13 LAB — ACETAMINOPHEN LEVEL: Acetaminophen (Tylenol), Serum: <10 ug/mL — ABNORMAL LOW (ref 10–30)

## 2016-12-13 MED ORDER — PIPERACILLIN-TAZOBACTAM 3.375 G IVPB
3.3750 g | Freq: Three times a day (TID) | INTRAVENOUS | Status: DC
  Administered 2016-12-13 – 2016-12-14 (×4): 3.375 g via INTRAVENOUS
  Filled 2016-12-13 (×6): qty 50

## 2016-12-13 MED ORDER — PANTOPRAZOLE SODIUM 40 MG IV SOLR
40.0000 mg | Freq: Two times a day (BID) | INTRAVENOUS | Status: DC
  Administered 2016-12-13 – 2016-12-14 (×3): 40 mg via INTRAVENOUS
  Filled 2016-12-13 (×3): qty 40

## 2016-12-13 MED ORDER — ONDANSETRON HCL 4 MG/2ML IJ SOLN: 4.0000 mg | Freq: Three times a day (TID) | INTRAMUSCULAR | Status: DC | PRN

## 2016-12-13 MED ORDER — TETRAHYDROZOLINE HCL 0.05 % OP SOLN
1.0000 [drp] | Freq: Two times a day (BID) | OPHTHALMIC | Status: DC
  Administered 2016-12-13 – 2016-12-15 (×5): 1 [drp] via OPHTHALMIC
  Filled 2016-12-13: qty 15

## 2016-12-13 MED ORDER — CALCIUM CARBONATE-VITAMIN D 500-200 MG-UNIT PO TABS
1.0000 | ORAL_TABLET | Freq: Every day | ORAL | Status: DC
  Administered 2016-12-13 – 2016-12-15 (×3): 1 via ORAL
  Filled 2016-12-13 (×3): qty 1

## 2016-12-13 MED ORDER — FERROUS SULFATE 325 (65 FE) MG PO TABS
325.0000 mg | ORAL_TABLET | Freq: Every day | ORAL | Status: DC
  Administered 2016-12-15: 325 mg via ORAL
  Filled 2016-12-13 (×2): qty 1

## 2016-12-13 MED ORDER — ZOLPIDEM TARTRATE 5 MG PO TABS: 5.0000 mg | ORAL_TABLET | Freq: Every evening | ORAL | Status: DC | PRN

## 2016-12-13 MED ORDER — MORPHINE SULFATE (PF) 2 MG/ML IV SOLN: 1.0000 mg | INTRAVENOUS | Status: DC | PRN

## 2016-12-13 MED ORDER — PIPERACILLIN-TAZOBACTAM 3.375 G IVPB 30 MIN
3.3750 g | Freq: Once | INTRAVENOUS | Status: AC
  Administered 2016-12-13: 3.375 g via INTRAVENOUS
  Filled 2016-12-13: qty 50

## 2016-12-13 MED ORDER — ASPIRIN EC 81 MG PO TBEC
81.0000 mg | DELAYED_RELEASE_TABLET | Freq: Every day | ORAL | Status: DC
Start: 2016-12-13 — End: 2016-12-15
  Administered 2016-12-13 – 2016-12-15 (×3): 81 mg via ORAL
  Filled 2016-12-13 (×3): qty 1

## 2016-12-13 MED ORDER — SODIUM CHLORIDE 0.9 % IV SOLN: INTRAVENOUS | Status: DC

## 2016-12-13 MED ORDER — PANTOPRAZOLE SODIUM 40 MG PO TBEC: 40.0000 mg | DELAYED_RELEASE_TABLET | Freq: Every day | ORAL | Status: DC

## 2016-12-13 MED ORDER — POTASSIUM CHLORIDE CRYS ER 20 MEQ PO TBCR
40.0000 meq | EXTENDED_RELEASE_TABLET | Freq: Once | ORAL | Status: AC
  Administered 2016-12-13: 40 meq via ORAL
  Filled 2016-12-13: qty 2

## 2016-12-13 MED ORDER — HYDROXYZINE HCL 10 MG PO TABS
10.0000 mg | ORAL_TABLET | Freq: Three times a day (TID) | ORAL | Status: DC | PRN
  Filled 2016-12-13: qty 1

## 2016-12-13 MED ORDER — CALCIUM CITRATE-VITAMIN D 500-500 MG-UNIT PO CHEW
1.0000 | CHEWABLE_TABLET | Freq: Every day | ORAL | Status: DC
  Filled 2016-12-13: qty 1

## 2016-12-13 MED ORDER — IRBESARTAN 75 MG PO TABS
75.0000 mg | ORAL_TABLET | Freq: Every day | ORAL | Status: DC
  Administered 2016-12-13 – 2016-12-15 (×3): 75 mg via ORAL
  Filled 2016-12-13 (×3): qty 1
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
## 2016-12-13 NOTE — ED Notes (Signed)
Report given to David, RN 5E.  

## 2016-12-13 NOTE — ED Notes (Signed)
Ultrasound at bedside

## 2016-12-13 NOTE — Care Management Note (Signed)
Case Management Note  Patient Details  Name: [name] MRN: [id number] Date of Birth: Oct 02, 1934  Subjective/Objective:                  Intractable nausea and vomiting  Action/Plan: Date:  December 13, 2016  Chart reviewed for concurrent status and case management needs.  Will continue to follow patient progress.  Discharge Planning: following for needs  Expected discharge date: [id number]  Velva Harman, BSN, [place], [place]   Expected Discharge Date:   (unknown)               Expected Discharge Plan:  Home/Self Care  In-House Referral:     Discharge planning Services  CM Consult  Post Acute Care Choice:    Choice offered to:     DME Arranged:    DME Agency:     HH Arranged:    HH Agency:     Status of Service:  In process, will continue to follow  If discussed at Long Length of Stay Meetings, dates discussed:    Additional Comments:  Leeroy Cha, RN 12/13/2016, 9:43 AM
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
## 2016-12-13 NOTE — Progress Notes (Signed)
Pharmacy Antibiotic Note  [name] is a 82 y.o. female c/o abdominal pain and emesis admitted on 12/12/2016 with possible biliary systemic infection .  Pharmacy has been consulted for zosyn dosing.  Plan: Zosyn 3.375g IV q8h (4 hour infusion).  F/u scr/cultures as needed     Temp (24hrs), Avg:97.8 F (36.6 C), Min:97.7 F (36.5 C), Max:97.9 F (36.6 C)   Recent Labs Lab 12/12/16 1730 12/12/16 1858 12/12/16 2038 12/12/16 2343  WBC  --  10.7*  --   --   CREATININE 1.00  --   --   --   LATICACIDVEN  --   --  2.24* 0.74    CrCl cannot be calculated (Unknown ideal weight.).    Allergies  Allergen Reactions  . Codeine Nausea And Vomiting  . Erythromycin Nausea And Vomiting  . Sulfamethoxazole Nausea And Vomiting  . Amlodipine Cough    Antimicrobials this admission: 8/27 zosyn >>    >>   Dose adjustments this admission:   Microbiology results:  BCx:   UCx:    Sputum:    MRSA PCR:   Thank you for allowing pharmacy to be a part of this patient's care.  Dorrene German 12/13/2016 1:47 AM

## 2016-12-14 ENCOUNTER — Telehealth: Payer: Self-pay | Admitting: Gastroenterology

## 2016-12-14 ENCOUNTER — Inpatient Hospital Stay (HOSPITAL_COMMUNITY): Payer: Medicare Other

## 2016-12-14 DIAGNOSIS — R748 Abnormal levels of other serum enzymes: Secondary | ICD-10-CM

## 2016-12-14 DIAGNOSIS — I10 Essential (primary) hypertension: Secondary | ICD-10-CM

## 2016-12-14 DIAGNOSIS — R1084 Generalized abdominal pain: Secondary | ICD-10-CM

## 2016-12-14 DIAGNOSIS — I5032 Chronic diastolic (congestive) heart failure: Secondary | ICD-10-CM

## 2016-12-14 LAB — CBC
HEMATOCRIT: 29.6 % — AB (ref 36.0–46.0)
Hemoglobin: 9.7 g/dL — ABNORMAL LOW (ref 12.0–15.0)
MCH: 30.8 pg (ref 26.0–34.0)
MCHC: 32.8 g/dL (ref 30.0–36.0)
MCV: 94 fL (ref 78.0–100.0)
Platelets: 215 10*3/uL (ref 150–400)
RBC: 3.15 MIL/uL — ABNORMAL LOW (ref 3.87–5.11)
RDW: 15.3 % (ref 11.5–15.5)
WBC: 5.3 10*3/uL (ref 4.0–10.5)

## 2016-12-14 LAB — GLUCOSE, CAPILLARY
GLUCOSE-CAPILLARY: 122 mg/dL — AB (ref 65–99)
Glucose-Capillary: 61 mg/dL — ABNORMAL LOW (ref 65–99)

## 2016-12-14 LAB — COMPREHENSIVE METABOLIC PANEL
ALBUMIN: 2.9 g/dL — AB (ref 3.5–5.0)
ALT: 151 U/L — AB (ref 14–54)
AST: 111 U/L — AB (ref 15–41)
Alkaline Phosphatase: 222 U/L — ABNORMAL HIGH (ref 38–126)
Anion gap: 10 (ref 5–15)
BUN: 13 mg/dL (ref 6–20)
CHLORIDE: 111 mmol/L (ref 101–111)
CO2: 21 mmol/L — AB (ref 22–32)
CREATININE: 0.97 mg/dL (ref 0.44–1.00)
Calcium: 7.9 mg/dL — ABNORMAL LOW (ref 8.9–10.3)
GFR calc Af Amer: >60 mL/min (ref >60)
GFR calc non Af Amer: 54 mL/min — ABNORMAL LOW (ref >60)
GLUCOSE: 67 mg/dL (ref 65–99)
POTASSIUM: 3.7 mmol/L (ref 3.5–5.1)
Sodium: 142 mmol/L (ref 135–145)
Total Bilirubin: 1.5 mg/dL — ABNORMAL HIGH (ref 0.3–1.2)
Total Protein: 5.5 g/dL — ABNORMAL LOW (ref 6.5–8.1)

## 2016-12-14 LAB — HEPATITIS PANEL, ACUTE
HCV Ab: <0.1 s/co ratio (ref 0.0–0.9)
HCV Ab: 0.1 s/co ratio (ref 0.0–0.9)
HEP B C IGM: NEGATIVE
Hep A IgM: NEGATIVE
Hep A IgM: NEGATIVE
Hep B C IgM: NEGATIVE
Hepatitis B Surface Ag: NEGATIVE
Hepatitis B Surface Ag: NEGATIVE

## 2016-12-14 LAB — ANTINUCLEAR ANTIBODIES, IFA: ANTINUCLEAR ANTIBODIES, IFA: NEGATIVE

## 2016-12-14 LAB — ANTI-SMOOTH MUSCLE ANTIBODY, IGG: F-Actin IgG: 7 Units (ref 0–19)

## 2016-12-14 MED ORDER — DEXTROSE-NACL 5-0.9 % IV SOLN
INTRAVENOUS | Status: DC
  Administered 2016-12-14: 08:00:00 via INTRAVENOUS

## 2016-12-14 MED ORDER — PANTOPRAZOLE SODIUM 40 MG PO TBEC
40.0000 mg | DELAYED_RELEASE_TABLET | Freq: Every day | ORAL | Status: DC
  Administered 2016-12-15: 40 mg via ORAL
  Filled 2016-12-14: qty 1

## 2016-12-14 MED ORDER — GADOBENATE DIMEGLUMINE 529 MG/ML IV SOLN
15.0000 mL | Freq: Once | INTRAVENOUS | Status: AC | PRN
  Administered 2016-12-14: 15 mL via INTRAVENOUS

## 2016-12-14 MED ORDER — DEXTROSE 50 % IV SOLN
25.0000 mL | Freq: Once | INTRAVENOUS | Status: AC
  Administered 2016-12-14: 25 mL via INTRAVENOUS
  Filled 2016-12-14: qty 50

## 2016-12-14 NOTE — Evaluation (Signed)
Physical Therapy Evaluation Patient Details Name: [name] MRN: [id number] DOB: 06/26/1934 Today's Date: 12/14/2016   History of Present Illness  82 year old Caucasian female with a past medical history of hypertension, chronic respiratory failure on home oxygen, GERD, history of pulmonary embolism, no longer on anticoagulation, history of cholecystectomy, presented with abdominal pain, nausea, vomiting. Symptoms have been ongoing for 3 days. In the ED she was found to have abnormal LFTs. She was hospitalized for further management. CT scan shows Pt to have paraesophageal hernia    Clinical Impression  On eval, pt was Min guard assist for mobility. She walked ~125 feet x 2 with a RW. At baseline, she uses a cane when outside of the home. Pt c/o general weakness. O2 sat 94% on RA during ambulation. Recommend daily ambulation in hallway with family or nursing as tolerated. Recommend HHPT f/u.     Follow Up Recommendations Home health PT    Equipment Recommendations  None recommended by PT    Recommendations for Other Services       Precautions / Restrictions Precautions Precautions: Fall Restrictions Weight Bearing Restrictions: No      Mobility  Bed Mobility Overal bed mobility: Needs Assistance Bed Mobility: Supine to Sit     Supine to sit: HOB elevated;Supervision     General bed mobility comments: oob in recliner  Transfers Overall transfer level: Needs assistance Equipment used: Rolling walker (2 wheeled) Transfers: Sit to/from Stand Sit to Stand: Min guard         General transfer comment: Min guard for safety. VCs hand placement  Ambulation/Gait Ambulation/Gait assistance: Min guard Ambulation Distance (Feet): 125 Feet (x2) Assistive device: Rolling walker (2 wheeled) Gait Pattern/deviations: Step-through pattern;Decreased stride length     General Gait Details: slow but steady pace with RW. O2 sat 94% on RA  Stairs            Wheelchair  Mobility    Modified Rankin (Stroke Patients Only)       Balance Overall balance assessment: Needs assistance Sitting-balance support: Feet supported Sitting balance-Leahy Scale: Good Sitting balance - Comments: static sitting EOB    Standing balance support: Single extremity supported;During functional activity Standing balance-Leahy Scale: Poor Standing balance comment: Pt reaching to steady self with single UE support and requires external steadying assist from therapist                              Pertinent Vitals/Pain Pain Assessment: No/denies pain    Home Living Family/patient expects to be discharged to:: Private residence Living Arrangements: Spouse/significant other Available Help at Discharge: Family;Available 24 hours/day Type of Home: House Home Access: Stairs to enter   [employer] of Steps: 3-4 Home Layout: One level Home Equipment: Cane - single point      Prior Function Level of Independence: Independent with assistive device(s)         Comments: Pt uses cane for out of home mobility, reports she wears O2 only at night      Hand Dominance        Extremity/Trunk Assessment   Upper Extremity Assessment Upper Extremity Assessment: Defer to OT evaluation    Lower Extremity Assessment Lower Extremity Assessment: Generalized weakness    Cervical / Trunk Assessment Cervical / Trunk Assessment: Normal  Communication   Communication: No difficulties  Cognition Arousal/Alertness: Awake/alert Behavior During Therapy: WFL for tasks assessed/performed Overall Cognitive Status: Within Functional Limits for tasks assessed  General Comments      Exercises     Assessment/Plan    PT Assessment Patient needs continued PT services  PT Problem List Decreased strength;Decreased mobility;Decreased activity tolerance;Decreased balance       PT Treatment Interventions  DME instruction;Gait training;Therapeutic activities;Therapeutic exercise;Patient/family education;Functional mobility training;Balance training    PT Goals (Current goals can be found in the Care Plan section)  Acute Rehab PT Goals Patient Stated Goal: return home. get stronger PT Goal Formulation: With patient Time For Goal Achievement: 12/28/16 Potential to Achieve Goals: Good    Frequency Min 3X/week   Barriers to discharge        Co-evaluation               AM-PAC PT "6 Clicks" Daily Activity  Outcome Measure Difficulty turning over in bed (including adjusting bedclothes, sheets and blankets)?: A Little Difficulty moving from lying on back to sitting on the side of the bed? : A Little Difficulty sitting down on and standing up from a chair with arms (e.g., wheelchair, bedside commode, etc,.)?: A Little Help needed moving to and from a bed to chair (including a wheelchair)?: A Little Help needed walking in hospital room?: A Little Help needed climbing 3-5 steps with a railing? : A Little 6 Click Score: 18    End of Session Equipment Utilized During Treatment: Gait belt Activity Tolerance: Patient tolerated treatment well Patient left: in chair;with call bell/phone within reach   PT Visit Diagnosis: Muscle weakness (generalized) (M62.81);Difficulty in walking, not elsewhere classified (R26.2)    Time: 9924-2683 PT Time Calculation (min) (ACUTE ONLY): 26 min   Charges:   PT Evaluation $PT Eval Low Complexity: 1 Low PT Treatments $Gait Training: 8-22 mins   PT G Codes:         Weston Anna, MPT Pager: [phone]

## 2016-12-14 NOTE — Progress Notes (Signed)
[facility] Progress Note  Chief Complaint:    Abdominal pain, vomiting  Subjective: no abdominal pain, just a little weak and hungry.   Objective:  Vital signs in last 24 hours: Temp:  [98.3 F (36.8 C)-99.3 F (37.4 C)] 98.3 F (36.8 C) (08/28 0505) Pulse Rate:  [61-63] 61 (08/28 0505) Resp:  [19-20] 19 (08/28 0505) BP: (126-152)/(58-75) 152/58 (08/28 0505) SpO2:  [93 %-98 %] 98 % (08/28 0505) Last BM Date: 12/12/16 General:   Alert, obese white female in NAD  Heart:  Regular rate and rhythm; no murmurs.No lower extremity edema Pulm: Normal respiratory effort, lungs CTA bilaterally without wheezes or crackles. Abdomen:  Soft, nondistended, nontender.  Normal bowel sounds, no masses felt. No hepatomegaly.    Neurologic:  Alert and  oriented x4;  grossly normal neurologically. Psych:  Pleasant, cooperative.  Normal mood and affect.   Intake/Output from previous day: 08/27 0701 - 08/28 0700 In: 90 [P.O.:40; IV Piggyback:50] Out: 0  Intake/Output this shift: No intake/output data recorded.  Lab Results:  Recent Labs  12/12/16 1858 12/13/16 0415 12/14/16 0540  WBC 10.7* 5.8 5.3  HGB 11.1* 9.8* 9.7*  HCT 32.9* 28.8* 29.6*  PLT 236 228 215   BMET  Recent Labs  12/12/16 1730 12/13/16 0415 12/14/16 0540  NA 137 139 142  K 3.6 3.4* 3.7  CL 105 108 111  CO2 21* 24 21*  GLUCOSE 180* 122* 67  BUN 19 15 13   CREATININE 1.00 0.88 0.97  CALCIUM 8.9 8.1* 7.9*   LFT  Recent Labs  12/12/16 2234  12/14/16 0540  PROT 6.2*  < > 5.5*  ALBUMIN 3.2*  < > 2.9*  AST 308*  < > 111*  ALT 196*  < > 151*  ALKPHOS 278*  < > 222*  BILITOT 3.0*  < > 1.5*  BILIDIR 1.2*  --   --   IBILI 1.8*  --   --   < > = values in this interval not displayed. PT/INR  Recent Labs  12/12/16 2321  LABPROT 13.6  INR 1.04   Hepatitis Panel  Recent Labs  12/13/16 1218  HEPBSAG Negative  HCVAB 0.1  HEPAIGM Negative  HEPBIGM Negative    Ct Abdomen Pelvis W  Contrast  Result Date: 12/12/2016 CLINICAL DATA:  Vomiting and upper abdominal pain beginning today. History of hiatal hernia. EXAM: CT ABDOMEN AND PELVIS WITH CONTRAST TECHNIQUE: Multidetector CT imaging of the abdomen and pelvis was performed using the standard protocol following bolus administration of intravenous contrast. CONTRAST:  <See Chart> ISOVUE-300 IOPAMIDOL (ISOVUE-300) INJECTION 61% COMPARISON:  None. FINDINGS: LOWER CHEST: RIGHT lung base compressive atelectasis due to hiatal hernia. Included heart size is normal. No pericardial effusion. HEPATOBILIARY: 11 mm cyst RIGHT lobe of the liver, minimal intrahepatic pneumobilia. Status post cholecystectomy. PANCREAS: Normal. SPLEEN: Normal. ADRENALS/URINARY TRACT: Kidneys are orthotopic, demonstrating symmetric enhancement. No nephrolithiasis, hydronephrosis or solid renal masses. Too small to characterize hypodensity LEFT interpolar kidney. The unopacified ureters are normal in course and caliber. Delayed imaging through the kidneys demonstrates symmetric prompt contrast excretion within the proximal urinary collecting system. Urinary bladder is well distended and unremarkable. Normal adrenal glands. STOMACH/BOWEL: Large hiatal hernia small amount of free fluid within the posterior hernia sac with fat stranding (axial 1/93). The small and large bowel are normal in course and caliber without inflammatory changes, limited assessment without contrast. Moderate sigmoid colonic diverticulosis with a few additional scattered diverticula. Distal duodenum gas distended diverticulum. Normal appendix. VASCULAR/LYMPHATIC:  Aortoiliac vessels are normal in course and caliber. Moderate calcific atherosclerosis. No lymphadenopathy by CT size criteria. REPRODUCTIVE: Normal. OTHER: No intraperitoneal free fluid or free air. MUSCULOSKELETAL: Nonacute. Small fat containing umbilical hernia. Minimal grade 1 L4-5 anterolisthesis on degenerative basis. IMPRESSION: 1. Large  hiatal hernia. Nonspecific free fluid and inflammation within hernia sac (possible gastritis), incompletely assessed. 2. Diverticulosis without acute diverticulitis nor acute intra-abdominal/pelvic process. Aortic Atherosclerosis (ICD10-I70.0). Electronically Signed   By: Elon Alas M.D.   On: 12/12/2016 22:23   US Abdomen Limited Ruq  Result Date: 12/13/2016 CLINICAL DATA:  Right upper quadrant abdominal pain EXAM: ULTRASOUND ABDOMEN LIMITED RIGHT UPPER QUADRANT COMPARISON:  CT abdomen pelvis 12/12/2016 FINDINGS: Gallbladder: The gallbladder is surgically absent. Common bile duct: Diameter: 9 mm Liver: Increased hepatic echogenicity suggest steatosis, though this is not confirmed on the concomitant CT. Portal vein is patent on color Doppler imaging with normal direction of blood flow towards the liver. IMPRESSION: No acute ultrasonographic abnormality of the right upper quadrant, status post cholecystectomy. Electronically Signed   By: Ulyses Jarred M.D.   On: 12/13/2016 03:37    ASSESSMENT / PLAN:   Episodic, non-radiating epigastric pain since March, this time with associated nausea, vomiting and abnormal LFTs. Hx of CBD stones, s/p ERCP with stone extraction and sphincterotomy 2004. Rule out recurrent bile duct stone. MRCP done, results pending.   -await MRCP results. Feed her if negative -awaiting ASMA -liver chemistries improving -If MRCP negative and no other etiologies becomes apparent then she may need EUS to evaluate for microlithiasis.  -CTscan reveals a large paraesophageal hernia with stranding which could result in pain and vomiting though her symptoms coincide with abnormal liver chemistries. If pain / vomiting persists following resolution / treatment of abnormal liver chemistries then needs surgical evaluation of large paraesophageal hernia.    Principal Problem:   Abdominal pain Active Problems:   Hypertension   GERD (gastroesophageal reflux disease)   Iron deficiency  anemia   Abnormal LFTs   Sepsis ([place])   Hiatal hernia   Chronic diastolic CHF (congestive heart failure) ([place])   LOS: 1 day   [name] ,NP 12/14/2016, 9:19 AM  Pager number [phone]

## 2016-12-14 NOTE — Progress Notes (Addendum)
[facility] PROGRESS NOTE  TRISTEN PENNINO NWG:[id number] DOB: 14-May-1934 DOA: 12/12/2016  PCP: Leighton Ruff, MD  Brief History/Interval Summary: 82 year old Caucasian female with a past medical history of hypertension, chronic respiratory failure on home oxygen, GERD, history of pulmonary embolism, no longer on anticoagulation, history of cholecystectomy, presented with abdominal pain, nausea, vomiting. Symptoms have been ongoing for 3 days. In the emergency department she was found to have abnormal LFTs. She was hospitalized for further management.  Reason for Visit: Abnormal LFTs  Consultants: Gastroenterology  Procedures: None yet  Antibiotics: Zosyn  Subjective/Interval History: Vision feels much better. Denies any nausea or vomiting. Abdominal pain has resolved. Complains of getting fatigued with exertion.   ROS: Denies any headaches.  Objective:  Vital Signs  Vitals:   12/13/16 0903 12/13/16 1352 12/13/16 2150 12/14/16 0505  BP: (!) 127/59 (!) 136/59 126/75 (!) 152/58  Pulse: (!) 59 63 62 61  Resp: 18 20 20 19   Temp: 97.9 F (36.6 C) 99.3 F (37.4 C) 98.7 F (37.1 C) 98.3 F (36.8 C)  TempSrc: Oral Oral Oral Oral  SpO2: 97% 93% 95% 98%  Weight: 74.5 kg (164 lb 4.8 oz)     Height: 5\' 2"  (1.575 m)       Intake/Output Summary (Last 24 hours) at 12/14/16 0855 Last data filed at 12/14/16 0753  Gross per 24 hour  Intake               90 ml  Output                0 ml  Net               90 ml   Filed Weights   12/13/16 0903  Weight: 74.5 kg (164 lb 4.8 oz)    General appearance: Awake, alert. In no distress. Resp: Few crackles at the bases, which decrease with deep breathing. No wheezing or rhonchi. Normal effort. Cardio: S1, S2 is normal, regular. No S3, S4. No rubs, murmurs, or bruit GI: Abdomen remains soft. Nontender, nondistended today. No masses or organomegaly. Bowel sounds are present.. Extremities: No edema Neurologic: Oriented 3. No  focal neurological deficits.  Lab Results:  Data Reviewed: I have personally reviewed following labs and imaging studies  CBC:  Recent Labs Lab 12/12/16 1858 12/13/16 0415 12/14/16 0540  WBC 10.7* 5.8 5.3  NEUTROABS 9.1* 4.1  --   HGB 11.1* 9.8* 9.7*  HCT 32.9* 28.8* 29.6*  MCV 90.1 92.0 94.0  PLT 236 228 578    Basic Metabolic Panel:  Recent Labs Lab 12/12/16 1730 12/13/16 0415 12/14/16 0540  NA 137 139 142  K 3.6 3.4* 3.7  CL 105 108 111  CO2 21* 24 21*  GLUCOSE 180* 122* 67  BUN 19 15 13   CREATININE 1.00 0.88 0.97  CALCIUM 8.9 8.1* 7.9*    GFR: Estimated Creatinine Clearance: 43.7 mL/min (by C-G formula based on SCr of 0.97 mg/dL).  Liver Function Tests:  Recent Labs Lab 12/12/16 1730 12/12/16 2234 12/13/16 0415 12/14/16 0540  AST <5* 308* 278* 111*  ALT 194* 196* 210* 151*  ALKPHOS 358* 278* 263* 222*  BILITOT 2.6* 3.0* 2.9* 1.5*  PROT 6.8 6.2* 5.9* 5.5*  ALBUMIN 3.6 3.2* 3.1* 2.9*     Recent Labs Lab 12/12/16 1730  LIPASE 40    Recent Labs Lab 12/12/16 2321  AMMONIA 40*    Coagulation Profile:  Recent Labs Lab 12/12/16 2321  INR 1.04  Radiology Studies: Ct Abdomen Pelvis W Contrast  Result Date: 12/12/2016 CLINICAL DATA:  Vomiting and upper abdominal pain beginning today. History of hiatal hernia. EXAM: CT ABDOMEN AND PELVIS WITH CONTRAST TECHNIQUE: Multidetector CT imaging of the abdomen and pelvis was performed using the standard protocol following bolus administration of intravenous contrast. CONTRAST:  <See Chart> ISOVUE-300 IOPAMIDOL (ISOVUE-300) INJECTION 61% COMPARISON:  None. FINDINGS: LOWER CHEST: RIGHT lung base compressive atelectasis due to hiatal hernia. Included heart size is normal. No pericardial effusion. HEPATOBILIARY: 11 mm cyst RIGHT lobe of the liver, minimal intrahepatic pneumobilia. Status post cholecystectomy. PANCREAS: Normal. SPLEEN: Normal. ADRENALS/URINARY TRACT: Kidneys are orthotopic, demonstrating  symmetric enhancement. No nephrolithiasis, hydronephrosis or solid renal masses. Too small to characterize hypodensity LEFT interpolar kidney. The unopacified ureters are normal in course and caliber. Delayed imaging through the kidneys demonstrates symmetric prompt contrast excretion within the proximal urinary collecting system. Urinary bladder is well distended and unremarkable. Normal adrenal glands. STOMACH/BOWEL: Large hiatal hernia small amount of free fluid within the posterior hernia sac with fat stranding (axial 1/93). The small and large bowel are normal in course and caliber without inflammatory changes, limited assessment without contrast. Moderate sigmoid colonic diverticulosis with a few additional scattered diverticula. Distal duodenum gas distended diverticulum. Normal appendix. VASCULAR/LYMPHATIC: Aortoiliac vessels are normal in course and caliber. Moderate calcific atherosclerosis. No lymphadenopathy by CT size criteria. REPRODUCTIVE: Normal. OTHER: No intraperitoneal free fluid or free air. MUSCULOSKELETAL: Nonacute. Small fat containing umbilical hernia. Minimal grade 1 L4-5 anterolisthesis on degenerative basis. IMPRESSION: 1. Large hiatal hernia. Nonspecific free fluid and inflammation within hernia sac (possible gastritis), incompletely assessed. 2. Diverticulosis without acute diverticulitis nor acute intra-abdominal/pelvic process. Aortic Atherosclerosis (ICD10-I70.0). Electronically Signed   By: Elon Alas M.D.   On: 12/12/2016 22:23   US Abdomen Limited Ruq  Result Date: 12/13/2016 CLINICAL DATA:  Right upper quadrant abdominal pain EXAM: ULTRASOUND ABDOMEN LIMITED RIGHT UPPER QUADRANT COMPARISON:  CT abdomen pelvis 12/12/2016 FINDINGS: Gallbladder: The gallbladder is surgically absent. Common bile duct: Diameter: 9 mm Liver: Increased hepatic echogenicity suggest steatosis, though this is not confirmed on the concomitant CT. Portal vein is patent on color Doppler imaging with  normal direction of blood flow towards the liver. IMPRESSION: No acute ultrasonographic abnormality of the right upper quadrant, status post cholecystectomy. Electronically Signed   By: Ulyses Jarred M.D.   On: 12/13/2016 03:37     Medications:  Scheduled: . aspirin EC  81 mg Oral Daily  . calcium-vitamin D  1 tablet Oral Daily  . ferrous sulfate  325 mg Oral Q breakfast  . irbesartan  75 mg Oral Daily  . pantoprazole (PROTONIX) IV  40 mg Intravenous Q12H  . tetrahydrozoline  1 drop Both Eyes BID   Continuous: . dextrose 5 % and 0.9% NaCl 75 mL/hr at 12/14/16 0805  . piperacillin-tazobactam (ZOSYN)  IV Stopped (12/14/16 0800)   PZW:CHENIDPOEUM, morphine injection, ondansetron (ZOFRAN) IV, zolpidem  Assessment/Plan:  Principal Problem:   Abdominal pain Active Problems:   Hypertension   GERD (gastroesophageal reflux disease)   Iron deficiency anemia   Abnormal LFTs   Sepsis ([facility])   Hiatal hernia   Chronic diastolic CHF (congestive heart failure) ([facility])    Abdominal pain with nausea, vomiting, abnormal LFTs with transaminitis and hyperbilirubinemia/pneumobilia. Etiology for her presentation is not entirely clear. Her AST, ALT, bilirubin and alkaline phosphatase were noted to be elevated. Better this morning. Patient underwent CT scan of the abdomen and pelvis along with ultrasound of the right upper  quadrant. Pneumobilia was noted. She is status post cholecystectomy. CBD size is 9 mm. gastroenterology was consulted. Patient underwent MRCP this morning. Report is pending. Patient was also placed on Zosyn, which is being continued. Patient's pro-calcitonin level was noted to be elevated, but lactic acid level was normal. Hepatitis panel is unremarkable. Further management per gastroenterology. Sepsis from biliary source appears to have resolved.  Normocytic anemia/iron deficiency Hemoglobin was 12.4 in June. Drop in hemoglobin is likely due to dilution. Hemoglobin remains stable this  morning. No evidence for overt bleeding. Continue to monitor for now. Continue iron supplementation.   Essential hypertension. Monitor blood pressures closely. Continue her ARB.  Hypoglycemia Low CBG noted this morning. Likely due to the fact that she is nothing by mouth. She was given dextrose. We will change her IV fluids and continue at a lower rate. Repeat CBG was normal.  History of GERD/Hiatal hernia/dysphagia Continue PPI. Recent upper endoscopy in May with esophageal dilatation.  History of chronic diastolic CHF Echocardiogram from 2016 showed normal systolic function with grade 1 diastolic dysfunction. She appears to be well compensated at this time. Continue to monitor closely.  Chronic respiratory failure with hypoxia. She is on home oxygen. Has been on this since the rib fractures that she sustained in the past. Also has a history of PE, but is no longer on anticoagulation. All these issues appear to be stable. Continue oxygen.  DVT Prophylaxis: SCDs    Code Status: Full code  Family Communication: Discussed with the patient and her husband  Disposition Plan: Management as outlined above.    LOS: 1 day   [facility] Pager [phone] 12/14/2016, 8:55 AM  If 7PM-7AM, please contact night-coverage at [email or web address], password [facility]

## 2016-12-14 NOTE — Evaluation (Signed)
Occupational Therapy Evaluation Patient Details Name: [name] MRN: [id number] DOB: [birth year] Today's Date: 12/14/2016    History of Present Illness 82 year old Caucasian female with a past medical history of hypertension, chronic respiratory failure on home oxygen, GERD, history of pulmonary embolism, no longer on anticoagulation, history of cholecystectomy, presented with abdominal pain, nausea, vomiting. Symptoms have been ongoing for 3 days. In the ED she was found to have abnormal LFTs. She was hospitalized for further management. CT scan shows Pt to have paraesophageal hernia   Clinical Impression   This 82 y/o F presents with the above. Pt lives with spouse, and at baseline is mod independent with SPC (reports only uses out of home), reports mod independence with ADLs. Pt currently requires MinA for functional mobility and LB ADLs. Demonstrates increased SOB during room level mobility however O2 sats remaining above 93% on RA. Will continue to follow acutely to maximize Pt's activity tolerance, safety, and independence with ADLs and functional mobility prior to return home.     Follow Up Recommendations  No OT follow up;Supervision/Assistance - 24 hour    Equipment Recommendations  Other (comment) (Pt declining 3:1 at this time )           Precautions / Restrictions Precautions Precautions: Fall Restrictions Weight Bearing Restrictions: No      Mobility Bed Mobility Overal bed mobility: Needs Assistance Bed Mobility: Supine to Sit     Supine to sit: HOB elevated;Supervision     General bed mobility comments: supervision for safety and HOB elevated   Transfers Overall transfer level: Needs assistance Equipment used: None Transfers: Sit to/from Stand Sit to Stand: Min guard         General transfer comment: MinGaurd for safety; requires MinA during room level ambulation as Pt reaching out to steady self on furniture/walls     Balance Overall balance  assessment: Needs assistance Sitting-balance support: Feet supported Sitting balance-Leahy Scale: Good Sitting balance - Comments: static sitting EOB    Standing balance support: Single extremity supported;During functional activity Standing balance-Leahy Scale: Poor Standing balance comment: Pt reaching to steady self with single UE support and requires external steadying assist from therapist                            ADL either performed or assessed with clinical judgement   ADL Overall ADL's : Needs assistance/impaired Eating/Feeding: Sitting;Set up   Grooming: Min guard;Wash/dry hands;Standing   Upper Body Bathing: Min guard;Sitting   Lower Body Bathing: Sit to/from stand;Minimal assistance   Upper Body Dressing : Min guard;Sitting   Lower Body Dressing: Sit to/from stand;Minimal assistance Lower Body Dressing Details (indicate cue type and reason): Pt demonstrates figure 4 technique for completing LB dressing  Toilet Transfer: Minimal assistance;Ambulation;Regular Toilet;Grab bars   Toileting- Clothing Manipulation and Hygiene: Minimal assistance;Sit to/from stand       Functional mobility during ADLs: Minimal assistance General ADL Comments: Pt completing room level functional mobility without SPC (per Pt request to try doing so) however Pt reaching for walls/items to steady self throughout and with Min steady assist from therapist, would benefit from use of cane with further mobility; Pt initially on 2L O2 start of session, on RA during ambulation to bathroom and standing to wash hands at sink, does appear SOB during task completion however O2 sats remaining 93% and above throughout.  Pertinent Vitals/Pain Pain Assessment: No/denies pain          Extremity/Trunk Assessment Upper Extremity Assessment Upper Extremity Assessment: Generalized weakness   Lower Extremity Assessment Lower Extremity Assessment: Defer to PT  evaluation       Communication Communication Communication: No difficulties   Cognition Arousal/Alertness: Awake/alert Behavior During Therapy: WFL for tasks assessed/performed Overall Cognitive Status: Within Functional Limits for tasks assessed                                                      Home Living Family/patient expects to be discharged to:: Private residence Living Arrangements: Spouse/significant other Available Help at Discharge: Family;Available 24 hours/day Type of Home: House Home Access: Stairs to enter [employer] of Steps: 3-4   Home Layout: One level     Bathroom Shower/Tub: Occupational psychologist: Standard     Home Equipment: Cane - single point          Prior Functioning/Environment Level of Independence: Independent with assistive device(s)        Comments: Pt uses cane for out of home mobility, reports she wears O2 only at night         OT Problem List: Impaired balance (sitting and/or standing);Decreased strength;Decreased activity tolerance;Decreased knowledge of use of DME or AE      OT Treatment/Interventions: Self-care/ADL training;DME and/or AE instruction;Therapeutic activities;Balance training;Patient/family education;Energy conservation;Therapeutic exercise    OT Goals(Current goals can be found in the care plan section) Acute Rehab OT Goals Patient Stated Goal: return home OT Goal Formulation: With patient Time For Goal Achievement: 12/28/16 Potential to Achieve Goals: Good  OT Frequency: Min 2X/week                             AM-PAC PT "6 Clicks" Daily Activity     Outcome Measure Help from another person eating meals?: None Help from another person taking care of personal grooming?: A Little Help from another person toileting, which includes using toliet, bedpan, or urinal?: A Little Help from another person bathing (including washing, rinsing, drying)?: A  Little Help from another person to put on and taking off regular upper body clothing?: None Help from another person to put on and taking off regular lower body clothing?: A Little 6 Click Score: 20   End of Session Equipment Utilized During Treatment: Gait belt;Oxygen Nurse Communication: Mobility status  Activity Tolerance: Patient tolerated treatment well Patient left: in chair;with call bell/phone within reach;with chair alarm set  OT Visit Diagnosis: Unsteadiness on feet (R26.81);Muscle weakness (generalized) (M62.81)                Time: 8144-8185 OT Time Calculation (min): 30 min Charges:  OT General Charges $OT Visit: 1 Visit OT Evaluation $OT Eval Low Complexity: 1 Low OT Treatments $Self Care/Home Management : 8-22 mins G-Codes:     Lou Cal, OT Pager [phone] 12/14/2016   Raymondo Band 12/14/2016, 2:15 PM

## 2016-12-14 NOTE — Progress Notes (Signed)
PHARMACY NOTE -  [facility] has been assisting with dosing of Zosyn for possible biliary infection. Dosage remains stable at 3.375 g IV q8 hr and need for further dosage adjustment appears unlikely at present.    Will sign off at this time.  Please reconsult if a change in clinical status warrants re-evaluation of dosage.  Reuel Boom, PharmD, BCPS Pager: [phone] 12/14/2016, 11:31 AM

## 2016-12-14 NOTE — Telephone Encounter (Signed)
I spoke with Dr. Hilarie Fredrickson about [name]. This certainly could be consistent with bile duct stones, bile duct pathology. She has had bile duct stones in the past removed by ERCP. Currently biliary-like pain with elevated liver tests that are resolving without specific therapy. Acute hepatitis panel was negative.  Patty, can you  please get in touch with her about EUS +/-  ERCP next week September 6 on Thursday. She is probably leaving the hospital tomorrow. Thank you

## 2016-12-15 ENCOUNTER — Other Ambulatory Visit: Payer: Self-pay

## 2016-12-15 DIAGNOSIS — R7989 Other specified abnormal findings of blood chemistry: Secondary | ICD-10-CM

## 2016-12-15 DIAGNOSIS — R109 Unspecified abdominal pain: Secondary | ICD-10-CM

## 2016-12-15 DIAGNOSIS — K8050 Calculus of bile duct without cholangitis or cholecystitis without obstruction: Secondary | ICD-10-CM

## 2016-12-15 DIAGNOSIS — R748 Abnormal levels of other serum enzymes: Secondary | ICD-10-CM

## 2016-12-15 DIAGNOSIS — K449 Diaphragmatic hernia without obstruction or gangrene: Secondary | ICD-10-CM

## 2016-12-15 DIAGNOSIS — R933 Abnormal findings on diagnostic imaging of other parts of digestive tract: Secondary | ICD-10-CM

## 2016-12-15 DIAGNOSIS — R945 Abnormal results of liver function studies: Secondary | ICD-10-CM

## 2016-12-15 LAB — COMPREHENSIVE METABOLIC PANEL
ALK PHOS: 174 U/L — AB (ref 38–126)
ALT: 96 U/L — AB (ref 14–54)
AST: 46 U/L — AB (ref 15–41)
Albumin: 2.8 g/dL — ABNORMAL LOW (ref 3.5–5.0)
Anion gap: 8 (ref 5–15)
BUN: 10 mg/dL (ref 6–20)
CHLORIDE: 110 mmol/L (ref 101–111)
CO2: 22 mmol/L (ref 22–32)
CREATININE: 0.81 mg/dL (ref 0.44–1.00)
Calcium: 8.2 mg/dL — ABNORMAL LOW (ref 8.9–10.3)
GFR calc Af Amer: >60 mL/min (ref >60)
GFR calc non Af Amer: >60 mL/min (ref >60)
Glucose, Bld: 89 mg/dL (ref 65–99)
Potassium: 3.6 mmol/L (ref 3.5–5.1)
SODIUM: 140 mmol/L (ref 135–145)
Total Bilirubin: 0.9 mg/dL (ref 0.3–1.2)
Total Protein: 5.5 g/dL — ABNORMAL LOW (ref 6.5–8.1)

## 2016-12-15 LAB — CBC
HCT: 28.9 % — ABNORMAL LOW (ref 36.0–46.0)
HEMOGLOBIN: 9.7 g/dL — AB (ref 12.0–15.0)
MCH: 30.6 pg (ref 26.0–34.0)
MCHC: 33.6 g/dL (ref 30.0–36.0)
MCV: 91.2 fL (ref 78.0–100.0)
PLATELETS: 218 10*3/uL (ref 150–400)
RBC: 3.17 MIL/uL — AB (ref 3.87–5.11)
RDW: 14.9 % (ref 11.5–15.5)
WBC: 4.9 10*3/uL (ref 4.0–10.5)

## 2016-12-15 LAB — GLUCOSE, CAPILLARY: Glucose-Capillary: 71 mg/dL (ref 65–99)

## 2016-12-15 MED ORDER — ONDANSETRON HCL 4 MG PO TABS: 4.0000 mg | ORAL_TABLET | Freq: Every day | ORAL | 1 refills | Status: AC | PRN

## 2016-12-15 MED ORDER — TRAMADOL HCL 50 MG PO TABS: 50.0000 mg | ORAL_TABLET | Freq: Four times a day (QID) | ORAL | 0 refills | Status: AC | PRN

## 2016-12-15 NOTE — Telephone Encounter (Signed)
12/23/16 EUS/ERCP 1230

## 2016-12-15 NOTE — Progress Notes (Signed)
Patient and husband verbalized understanding of discharge instructions. Patient was given prescriptions. Patient is stable at discharge.

## 2016-12-15 NOTE — Progress Notes (Signed)
[facility] Progress Note  Chief Complaint:   Abdominal pain, nausea, vomiting and abnormal liver labs  Subjective: Feels fine. Still no further episodes of abdominal pain or vomiting. Tolerating solids.   Objective:  Vital signs in last 24 hours: Temp:  [98.4 F (36.9 C)-98.6 F (37 C)] 98.4 F (36.9 C) (08/29 0500) Pulse Rate:  [50-64] 64 (08/29 0500) Resp:  [16-60] 16 (08/29 0500) BP: (145-161)/(57-65) 155/62 (08/29 0500) SpO2:  [95 %-100 %] 95 % (08/29 0500) Last BM Date: 12/14/16 General:   Alert,whtie female in NAD EENT:  Normal hearing, non icteric sclera, conjunctive pink.  Heart:  Regular rate and rhythm; no murmurs. no lower extremity edema Pulm: Normal respiratory effort, lungs CTA bilaterally without wheezes or crackles. Abdomen:  Soft, nondistended, nontender.  Normal bowel sounds, no masses felt.  Neurologic:  Alert and  oriented x4;  grossly normal neurologically. Psych:  Pleasant, cooperative.  Normal mood and affect.   Intake/Output from previous day: 08/28 0701 - 08/29 0700 In: 694.6 [P.O.:120; I.V.:424.6; IV Piggyback:150] Out: -  Intake/Output this shift: Total I/O In: 120 [P.O.:120] Out: -   Lab Results:  Recent Labs  12/13/16 0415 12/14/16 0540 12/15/16 0515  WBC 5.8 5.3 4.9  HGB 9.8* 9.7* 9.7*  HCT 28.8* 29.6* 28.9*  PLT 228 215 218   BMET  Recent Labs  12/13/16 0415 12/14/16 0540 12/15/16 0515  NA 139 142 140  K 3.4* 3.7 3.6  CL 108 111 110  CO2 24 21* 22  GLUCOSE 122* 67 89  BUN 15 13 10   CREATININE 0.88 0.97 0.81  CALCIUM 8.1* 7.9* 8.2*   LFT  Recent Labs  12/12/16 2234  12/15/16 0515  PROT 6.2*  < > 5.5*  ALBUMIN 3.2*  < > 2.8*  AST 308*  < > 46*  ALT 196*  < > 96*  ALKPHOS 278*  < > 174*  BILITOT 3.0*  < > 0.9  BILIDIR 1.2*  --   --   IBILI 1.8*  --   --   < > = values in this interval not displayed. PT/INR  Recent Labs  12/12/16 2321  LABPROT 13.6  INR 1.04   Hepatitis  Panel  Recent Labs  12/13/16 1218  HEPBSAG Negative  HCVAB 0.1  HEPAIGM Negative  HEPBIGM Negative    Mr 3d Recon At Scanner  Result Date: 12/14/2016 CLINICAL DATA:  82 year old female with history of right upper quadrant abdominal pain, nausea, vomiting and abnormal liver function tests. EXAM: MRI ABDOMEN WITHOUT AND WITH CONTRAST (INCLUDING MRCP) TECHNIQUE: Multiplanar multisequence MR imaging of the abdomen was performed both before and after the administration of intravenous contrast. Heavily T2-weighted images of the biliary and pancreatic ducts were obtained, and three-dimensional MRCP images were rendered by post processing. CONTRAST:  15mL MULTIHANCE GADOBENATE DIMEGLUMINE 529 MG/ML IV SOLN COMPARISON:  No prior abdominal MRI. CT the abdomen and pelvis 12/12/2016. FINDINGS: Lower chest: Large hiatal hernia. Trace bilateral pleural effusions lying dependently. Hepatobiliary: 12 mm T1 hypointense, T2 hyperintense nonenhancing lesion in the inferior aspect of segment 6 of the liver (axial image 31 of series 3), compatible with a simple cyst. No other suspicious hepatic lesions. Status post cholecystectomy. MRCP images demonstrate no intrahepatic biliary ductal dilatation. Common bile duct measures up to 8 mm in the porta hepatis, related to benign post cholecystectomy physiology. No definite filling defect within the common bile duct to suggest choledocholithiasis. Pancreas: No pancreatic mass. No pancreatic ductal dilatation on MRCP images.  No pancreatic or peripancreatic fluid or inflammatory changes. Spleen:  Unremarkable. Adrenals/Urinary Tract: 4 mm T1 hypointense, T2 hyperintense, nonenhancing lesion in the interpolar region of the left kidney is compatible with a tiny simple cyst. Right kidney and bilateral adrenal glands are normal in appearance. No hydroureteronephrosis in the visualized portions of the abdomen. Stomach/Bowel: Large hiatal hernia. Otherwise, visualized portions are  unremarkable. Vascular/Lymphatic: Aortic atherosclerosis, without definite aneurysm in the abdominal vasculature. Other: No significant volume of ascites noted in the visualized portions of the peritoneal cavity. Musculoskeletal: No aggressive appearing osseous lesions are noted in the visualized portions of the skeleton. IMPRESSION: 1. No acute findings to explain the patient's symptoms. 2. Status post cholecystectomy. 3. Large hiatal hernia. 4. Trace bilateral pleural effusions lying dependently. Electronically Signed   By: Vinnie Langton M.D.   On: 12/14/2016 07:57   Mr Abdomen Mrcp Moise Boring Contast  Result Date: 12/14/2016 CLINICAL DATA:  82 year old female with history of right upper quadrant abdominal pain, nausea, vomiting and abnormal liver function tests. EXAM: MRI ABDOMEN WITHOUT AND WITH CONTRAST (INCLUDING MRCP) TECHNIQUE: Multiplanar multisequence MR imaging of the abdomen was performed both before and after the administration of intravenous contrast. Heavily T2-weighted images of the biliary and pancreatic ducts were obtained, and three-dimensional MRCP images were rendered by post processing. CONTRAST:  97mL MULTIHANCE GADOBENATE DIMEGLUMINE 529 MG/ML IV SOLN COMPARISON:  No prior abdominal MRI. CT the abdomen and pelvis 12/12/2016. FINDINGS: Lower chest: Large hiatal hernia. Trace bilateral pleural effusions lying dependently. Hepatobiliary: 12 mm T1 hypointense, T2 hyperintense nonenhancing lesion in the inferior aspect of segment 6 of the liver (axial image 31 of series 3), compatible with a simple cyst. No other suspicious hepatic lesions. Status post cholecystectomy. MRCP images demonstrate no intrahepatic biliary ductal dilatation. Common bile duct measures up to 8 mm in the porta hepatis, related to benign post cholecystectomy physiology. No definite filling defect within the common bile duct to suggest choledocholithiasis. Pancreas: No pancreatic mass. No pancreatic ductal dilatation on MRCP  images. No pancreatic or peripancreatic fluid or inflammatory changes. Spleen:  Unremarkable. Adrenals/Urinary Tract: 4 mm T1 hypointense, T2 hyperintense, nonenhancing lesion in the interpolar region of the left kidney is compatible with a tiny simple cyst. Right kidney and bilateral adrenal glands are normal in appearance. No hydroureteronephrosis in the visualized portions of the abdomen. Stomach/Bowel: Large hiatal hernia. Otherwise, visualized portions are unremarkable. Vascular/Lymphatic: Aortic atherosclerosis, without definite aneurysm in the abdominal vasculature. Other: No significant volume of ascites noted in the visualized portions of the peritoneal cavity. Musculoskeletal: No aggressive appearing osseous lesions are noted in the visualized portions of the skeleton. IMPRESSION: 1. No acute findings to explain the patient's symptoms. 2. Status post cholecystectomy. 3. Large hiatal hernia. 4. Trace bilateral pleural effusions lying dependently. Electronically Signed   By: Vinnie Langton M.D.   On: 12/14/2016 07:57    ASSESSMENT / PLAN:   82 yo female with non-radiating epigastric pain since March, now with associated nausea, vomiting and abnormal LFTs. Hx of CBD stones, s/p ERCP with stone extraction and sphincterotomy 2004. MRCP negative for CBD stones but presentation still most c/w with stone / microlithiasis. Her LFTs have improved significantly.  -plan is for outpatient EUS , +/- ERCP next week with Dr. Ardis Hughs.  -for discharge. Will take ultram if needed for pain. Should she get recurrent abdominal pain patient will let us know  Large hiatal hernia. May need surgical evaluation at some point.    Principal Problem:   Abdominal pain  Active Problems:   Hypertension   GERD (gastroesophageal reflux disease)   Iron deficiency anemia   Abnormal LFTs   Sepsis ([place])   Hiatal hernia   Chronic diastolic CHF (congestive heart failure) ([place])   Elevated liver enzymes     LOS: 2 days    Tye Savoy ,NP 12/15/2016, 11:01 AM  Pager number [phone]

## 2016-12-15 NOTE — Telephone Encounter (Signed)
Left message on machine to call back  

## 2016-12-15 NOTE — Telephone Encounter (Signed)
EUSERCP scheduled, pt instructed and medications reviewed.  Patient instructions mailed to home.  Patient to call with any questions or concerns.  

## 2016-12-16 ENCOUNTER — Encounter (HOSPITAL_COMMUNITY): Payer: Self-pay

## 2016-12-18 LAB — CULTURE, BLOOD (ROUTINE X 2)
CULTURE: NO GROWTH
Culture: NO GROWTH
SPECIAL REQUESTS: ADEQUATE
SPECIAL REQUESTS: ADEQUATE

## 2016-12-19 NOTE — Discharge Summary (Signed)
Physician Discharge Summary  JEANNE DIEFENDORF MHD:[id number] DOB: 1934/06/07 DOA: 12/12/2016  PCP: Leighton Ruff, MD  Admit date: 12/12/2016 Discharge date: 12/19/2016  Admitted From: Home  Disposition:  Home   Recommendations for Outpatient Follow-up:  1. Follow up with PCP in 1-2 weeks 2. Please obtain BMP/CBC in one week. Follow LFT  3. Needs to follow up with GI for EUS +/- ERCP next week     Discharge Condition: stable.  CODE STATUS: full code.  Diet recommendation: Heart Healthy   Brief/Interim Summary: 82 year old Caucasian female with a past medical history of hypertension, chronic respiratory failure on home oxygen, GERD, history of pulmonary embolism, no longer on anticoagulation, history of cholecystectomy, presented with abdominal pain, nausea, vomiting. Symptoms have been ongoing for 3 days. In the emergency department she was found to have abnormal LFTs. She was hospitalized for further management.  Abdominal pain with nausea, vomiting, abnormal LFTs with transaminitis and hyperbilirubinemia/pneumobilia. Etiology for her presentation is not entirely clear. Her AST, ALT, bilirubin and alkaline phosphatase were noted to be elevated. Better this morning. Patient underwent CT scan of the abdomen and pelvis along with ultrasound of the right upper quadrant. Pneumobilia was noted. She is status post cholecystectomy. CBD size is 9 mm. gastroenterology was consulted. Patient underwent MRCP : No acute findings to explain the patient's symptoms. Status post cholecystectomy. Large hiatal hernia. Hepatitis panel is unremarkable. Further management per gastroenterology. Sepsis from biliary source appears to have resolved. -pain could be related to stones, microlithiasis. Plan is to follow up with Dr Ardis Hughs for EUS and or ERCP. LFT improving.   Normocytic anemia/iron deficiency Hemoglobin was 12.4 in June. Drop in hemoglobin is likely due to dilution. Hemoglobin remains stable this  morning. No evidence for overt bleeding. Continue to monitor for now. Continue iron supplementation.   Essential hypertension. Monitor blood pressures closely. Continue her ARB.  Hypoglycemia Low CBG noted this morning. Likely due to the fact that she is nothing by mouth. She was given dextrose. We will change her IV fluids and continue at a lower rate. Repeat CBG was normal.  History of GERD/Hiatal hernia/dysphagia Continue PPI. Recent upper endoscopy in May with esophageal dilatation.  History of chronic diastolic CHF Echocardiogram from 2016 showed normal systolic function with grade 1 diastolic dysfunction. She appears to be well compensated at this time. Continue to monitor closely. stable.   Chronic respiratory failure with hypoxia. She is on home oxygen. Has been on this since the rib fractures that she sustained in the past. Also has a history of PE, but is no longer on anticoagulation. All these issues appear to be stable. Continue oxygen.  Discharge Diagnoses:  Principal Problem:   Abdominal pain Active Problems:   Hypertension   GERD (gastroesophageal reflux disease)   Iron deficiency anemia   Abnormal LFTs   Sepsis (HCC)   Hiatal hernia   Chronic diastolic CHF (congestive heart failure) (HCC)   Elevated liver enzymes   Abnormal magnetic resonance cholangiopancreatography (MRCP)    Discharge Instructions  Discharge Instructions    Diet - low sodium heart healthy    Complete by:  As directed    Increase activity slowly    Complete by:  As directed      Allergies as of 12/15/2016      Reactions   Codeine Nausea And Vomiting   Erythromycin Nausea And Vomiting   Sulfamethoxazole Nausea And Vomiting   Amlodipine Cough      Medication List    STOP taking  these medications   acetaminophen 650 MG CR tablet Commonly known as:  TYLENOL     TAKE these medications   alendronate 70 MG tablet Commonly known as:  FOSAMAX Take 70 mg by mouth every  Friday.   aspirin EC 81 MG tablet Take 81 mg by mouth daily.   CALTRATE 600+D PLUS PO Take 1 tablet by mouth daily.   CLEAR EYES FOR DRY EYES OP Place 1 drop into both eyes 2 (two) times daily.   ferrous sulfate 325 (65 FE) MG tablet Take 325 mg by mouth daily with breakfast.   omeprazole 20 MG capsule Commonly known as:  PRILOSEC Take 20 mg by mouth daily.   ondansetron 4 MG tablet Commonly known as:  ZOFRAN Take 1 tablet (4 mg total) by mouth daily as needed for nausea or vomiting.   potassium chloride 10 MEQ tablet Commonly known as:  K-DUR Take 1 tablet (10 mEq total) by mouth 2 (two) times daily.   traMADol 50 MG tablet Commonly known as:  ULTRAM Take 1 tablet (50 mg total) by mouth every 6 (six) hours as needed.   trimethoprim 100 MG tablet Commonly known as:  TRIMPEX Take 100 mg by mouth daily.   valsartan-hydrochlorothiazide 80-12.5 MG tablet Commonly known as:  DIOVAN-HCT Take 0.5 tablets by mouth daily.            Discharge Care Instructions        Start     Ordered   12/15/16 0000  traMADol (ULTRAM) 50 MG tablet  Every 6 hours PRN     12/15/16 1104   12/15/16 0000  ondansetron (ZOFRAN) 4 MG tablet  Daily PRN     12/15/16 1104   12/15/16 0000  Increase activity slowly     12/15/16 1104   12/15/16 0000  Diet - low sodium heart healthy     12/15/16 1104      Allergies  Allergen Reactions  . Codeine Nausea And Vomiting  . Erythromycin Nausea And Vomiting  . Sulfamethoxazole Nausea And Vomiting  . Amlodipine Cough    Consultations:  GI   Procedures/Studies: Ct Abdomen Pelvis W Contrast  Result Date: 12/12/2016 CLINICAL DATA:  Vomiting and upper abdominal pain beginning today. History of hiatal hernia. EXAM: CT ABDOMEN AND PELVIS WITH CONTRAST TECHNIQUE: Multidetector CT imaging of the abdomen and pelvis was performed using the standard protocol following bolus administration of intravenous contrast. CONTRAST:  <See Chart> ISOVUE-300  IOPAMIDOL (ISOVUE-300) INJECTION 61% COMPARISON:  None. FINDINGS: LOWER CHEST: RIGHT lung base compressive atelectasis due to hiatal hernia. Included heart size is normal. No pericardial effusion. HEPATOBILIARY: 11 mm cyst RIGHT lobe of the liver, minimal intrahepatic pneumobilia. Status post cholecystectomy. PANCREAS: Normal. SPLEEN: Normal. ADRENALS/URINARY TRACT: Kidneys are orthotopic, demonstrating symmetric enhancement. No nephrolithiasis, hydronephrosis or solid renal masses. Too small to characterize hypodensity LEFT interpolar kidney. The unopacified ureters are normal in course and caliber. Delayed imaging through the kidneys demonstrates symmetric prompt contrast excretion within the proximal urinary collecting system. Urinary bladder is well distended and unremarkable. Normal adrenal glands. STOMACH/BOWEL: Large hiatal hernia small amount of free fluid within the posterior hernia sac with fat stranding (axial 1/93). The small and large bowel are normal in course and caliber without inflammatory changes, limited assessment without contrast. Moderate sigmoid colonic diverticulosis with a few additional scattered diverticula. Distal duodenum gas distended diverticulum. Normal appendix. VASCULAR/LYMPHATIC: Aortoiliac vessels are normal in course and caliber. Moderate calcific atherosclerosis. No lymphadenopathy by CT size criteria. REPRODUCTIVE: Normal. OTHER:  No intraperitoneal free fluid or free air. MUSCULOSKELETAL: Nonacute. Small fat containing umbilical hernia. Minimal grade 1 L4-5 anterolisthesis on degenerative basis. IMPRESSION: 1. Large hiatal hernia. Nonspecific free fluid and inflammation within hernia sac (possible gastritis), incompletely assessed. 2. Diverticulosis without acute diverticulitis nor acute intra-abdominal/pelvic process. Aortic Atherosclerosis (ICD10-I70.0). Electronically Signed   By: Elon Alas M.D.   On: 12/12/2016 22:23   Mr 3d Recon At Scanner  Result Date:  12/14/2016 CLINICAL DATA:  82 year old female with history of right upper quadrant abdominal pain, nausea, vomiting and abnormal liver function tests. EXAM: MRI ABDOMEN WITHOUT AND WITH CONTRAST (INCLUDING MRCP) TECHNIQUE: Multiplanar multisequence MR imaging of the abdomen was performed both before and after the administration of intravenous contrast. Heavily T2-weighted images of the biliary and pancreatic ducts were obtained, and three-dimensional MRCP images were rendered by post processing. CONTRAST:  33mL MULTIHANCE GADOBENATE DIMEGLUMINE 529 MG/ML IV SOLN COMPARISON:  No prior abdominal MRI. CT the abdomen and pelvis 12/12/2016. FINDINGS: Lower chest: Large hiatal hernia. Trace bilateral pleural effusions lying dependently. Hepatobiliary: 12 mm T1 hypointense, T2 hyperintense nonenhancing lesion in the inferior aspect of segment 6 of the liver (axial image 31 of series 3), compatible with a simple cyst. No other suspicious hepatic lesions. Status post cholecystectomy. MRCP images demonstrate no intrahepatic biliary ductal dilatation. Common bile duct measures up to 8 mm in the porta hepatis, related to benign post cholecystectomy physiology. No definite filling defect within the common bile duct to suggest choledocholithiasis. Pancreas: No pancreatic mass. No pancreatic ductal dilatation on MRCP images. No pancreatic or peripancreatic fluid or inflammatory changes. Spleen:  Unremarkable. Adrenals/Urinary Tract: 4 mm T1 hypointense, T2 hyperintense, nonenhancing lesion in the interpolar region of the left kidney is compatible with a tiny simple cyst. Right kidney and bilateral adrenal glands are normal in appearance. No hydroureteronephrosis in the visualized portions of the abdomen. Stomach/Bowel: Large hiatal hernia. Otherwise, visualized portions are unremarkable. Vascular/Lymphatic: Aortic atherosclerosis, without definite aneurysm in the abdominal vasculature. Other: No significant volume of ascites noted  in the visualized portions of the peritoneal cavity. Musculoskeletal: No aggressive appearing osseous lesions are noted in the visualized portions of the skeleton. IMPRESSION: 1. No acute findings to explain the patient's symptoms. 2. Status post cholecystectomy. 3. Large hiatal hernia. 4. Trace bilateral pleural effusions lying dependently. Electronically Signed   By: Vinnie Langton M.D.   On: 12/14/2016 07:57   Mr Abdomen Mrcp Moise Boring Contast  Result Date: 12/14/2016 CLINICAL DATA:  82 year old female with history of right upper quadrant abdominal pain, nausea, vomiting and abnormal liver function tests. EXAM: MRI ABDOMEN WITHOUT AND WITH CONTRAST (INCLUDING MRCP) TECHNIQUE: Multiplanar multisequence MR imaging of the abdomen was performed both before and after the administration of intravenous contrast. Heavily T2-weighted images of the biliary and pancreatic ducts were obtained, and three-dimensional MRCP images were rendered by post processing. CONTRAST:  83mL MULTIHANCE GADOBENATE DIMEGLUMINE 529 MG/ML IV SOLN COMPARISON:  No prior abdominal MRI. CT the abdomen and pelvis 12/12/2016. FINDINGS: Lower chest: Large hiatal hernia. Trace bilateral pleural effusions lying dependently. Hepatobiliary: 12 mm T1 hypointense, T2 hyperintense nonenhancing lesion in the inferior aspect of segment 6 of the liver (axial image 31 of series 3), compatible with a simple cyst. No other suspicious hepatic lesions. Status post cholecystectomy. MRCP images demonstrate no intrahepatic biliary ductal dilatation. Common bile duct measures up to 8 mm in the porta hepatis, related to benign post cholecystectomy physiology. No definite filling defect within the common bile duct to suggest choledocholithiasis.  Pancreas: No pancreatic mass. No pancreatic ductal dilatation on MRCP images. No pancreatic or peripancreatic fluid or inflammatory changes. Spleen:  Unremarkable. Adrenals/Urinary Tract: 4 mm T1 hypointense, T2 hyperintense,  nonenhancing lesion in the interpolar region of the left kidney is compatible with a tiny simple cyst. Right kidney and bilateral adrenal glands are normal in appearance. No hydroureteronephrosis in the visualized portions of the abdomen. Stomach/Bowel: Large hiatal hernia. Otherwise, visualized portions are unremarkable. Vascular/Lymphatic: Aortic atherosclerosis, without definite aneurysm in the abdominal vasculature. Other: No significant volume of ascites noted in the visualized portions of the peritoneal cavity. Musculoskeletal: No aggressive appearing osseous lesions are noted in the visualized portions of the skeleton. IMPRESSION: 1. No acute findings to explain the patient's symptoms. 2. Status post cholecystectomy. 3. Large hiatal hernia. 4. Trace bilateral pleural effusions lying dependently. Electronically Signed   By: Vinnie Langton M.D.   On: 12/14/2016 07:57   US Abdomen Limited Ruq  Result Date: 12/13/2016 CLINICAL DATA:  Right upper quadrant abdominal pain EXAM: ULTRASOUND ABDOMEN LIMITED RIGHT UPPER QUADRANT COMPARISON:  CT abdomen pelvis 12/12/2016 FINDINGS: Gallbladder: The gallbladder is surgically absent. Common bile duct: Diameter: 9 mm Liver: Increased hepatic echogenicity suggest steatosis, though this is not confirmed on the concomitant CT. Portal vein is patent on color Doppler imaging with normal direction of blood flow towards the liver. IMPRESSION: No acute ultrasonographic abnormality of the right upper quadrant, status post cholecystectomy. Electronically Signed   By: Ulyses Jarred M.D.   On: 12/13/2016 03:37       Subjective: No abdominal pain. tolerating diet.   Discharge Exam: Vitals:   12/14/16 2200 12/15/16 0500  BP: (!) 161/65 (!) 155/62  Pulse: 60 64  Resp: (!) 60 16  Temp: 98.6 F (37 C) 98.4 F (36.9 C)  SpO2: 99% 95%   Vitals:   12/14/16 0505 12/14/16 1309 12/14/16 2200 12/15/16 0500  BP: (!) 152/58 (!) 145/57 (!) 161/65 (!) 155/62  Pulse: 61 (!)  50 60 64  Resp: 19 18 (!) 60 16  Temp: 98.3 F (36.8 C) 98.4 F (36.9 C) 98.6 F (37 C) 98.4 F (36.9 C)  TempSrc: Oral Axillary Oral Oral  SpO2: 98% 100% 99% 95%  Weight:      Height:        General: Pt is alert, awake, not in acute distress Cardiovascular: RRR, S1/S2 +, no rubs, no gallops Respiratory: CTA bilaterally, no wheezing, no rhonchi Abdominal: Soft, NT, ND, bowel sounds + Extremities: no edema, no cyanosis    The results of significant diagnostics from this hospitalization (including imaging, microbiology, ancillary and laboratory) are listed below for reference.     Microbiology: Recent Results (from the past 240 hour(s))  Blood culture (routine x 2)     Status: None   Collection Time: 12/12/16  9:54 PM  Result Value Ref Range Status   Specimen Description BLOOD LEFT WRIST  Final   Special Requests IN PEDIATRIC BOTTLE Blood Culture adequate volume  Final   Culture   Final    NO GROWTH 5 DAYS Performed at [facility], [address], [place], [place] [address]    Report Status 12/18/2016 FINAL  Final  Blood culture (routine x 2)     Status: None   Collection Time: 12/12/16  9:59 PM  Result Value Ref Range Status   Specimen Description BLOOD LEFT FOREARM  Final   Special Requests   Final    BOTTLES DRAWN AEROBIC AND ANAEROBIC Blood Culture adequate volume  Culture   Final    NO GROWTH 5 DAYS Performed at [facility], [place] [address], [place], [place] [address]    Report Status 12/18/2016 FINAL  Final     Labs: BNP (last 3 results)  Recent Labs  12/13/16 0415  BNP 63.0   Basic Metabolic Panel:  Recent Labs Lab 12/12/16 1730 12/13/16 0415 12/14/16 0540 12/15/16 0515  NA 137 139 142 140  K 3.6 3.4* 3.7 3.6  CL 105 108 111 110  CO2 21* 24 21* 22  GLUCOSE 180* 122* 67 89  BUN 19 15 13 10   CREATININE 1.00 0.88 0.97 0.81  CALCIUM 8.9 8.1* 7.9* 8.2*   Liver Function Tests:  Recent Labs Lab 12/12/16 1730 12/12/16 2234  12/13/16 0415 12/14/16 0540 12/15/16 0515  AST <5* 308* 278* 111* 46*  ALT 194* 196* 210* 151* 96*  ALKPHOS 358* 278* 263* 222* 174*  BILITOT 2.6* 3.0* 2.9* 1.5* 0.9  PROT 6.8 6.2* 5.9* 5.5* 5.5*  ALBUMIN 3.6 3.2* 3.1* 2.9* 2.8*    Recent Labs Lab 12/12/16 1730  LIPASE 40    Recent Labs Lab 12/12/16 2321  AMMONIA 40*   CBC:  Recent Labs Lab 12/12/16 1858 12/13/16 0415 12/14/16 0540 12/15/16 0515  WBC 10.7* 5.8 5.3 4.9  NEUTROABS 9.1* 4.1  --   --   HGB 11.1* 9.8* 9.7* 9.7*  HCT 32.9* 28.8* 29.6* 28.9*  MCV 90.1 92.0 94.0 91.2  PLT 236 228 215 218   Cardiac Enzymes: No results for input(s): CKTOTAL, CKMB, CKMBINDEX, TROPONINI in the last 168 hours. BNP: Invalid input(s): POCBNP CBG:  Recent Labs Lab 12/14/16 0751 12/14/16 0910 12/15/16 0742  GLUCAP 61* 122* 71   D-Dimer No results for input(s): DDIMER in the last 72 hours. Hgb A1c No results for input(s): HGBA1C in the last 72 hours. Lipid Profile No results for input(s): CHOL, HDL, LDLCALC, TRIG, CHOLHDL, LDLDIRECT in the last 72 hours. Thyroid function studies No results for input(s): TSH, T4TOTAL, T3FREE, THYROIDAB in the last 72 hours.  Invalid input(s): FREET3 Anemia work up No results for input(s): VITAMINB12, FOLATE, FERRITIN, TIBC, IRON, RETICCTPCT in the last 72 hours. Urinalysis    Component Value Date/Time   COLORURINE YELLOW 12/12/2016 1730   APPEARANCEUR CLEAR 12/12/2016 1730   LABSPEC >1.030 (H) 12/12/2016 1730   PHURINE 5.5 12/12/2016 1730   GLUCOSEU NEGATIVE 12/12/2016 1730   HGBUR LARGE (A) 12/12/2016 1730   BILIRUBINUR SMALL (A) 12/12/2016 1730   KETONESUR NEGATIVE 12/12/2016 1730   PROTEINUR 30 (A) 12/12/2016 1730   UROBILINOGEN 0.2 06/28/2011 1358   NITRITE NEGATIVE 12/12/2016 1730   LEUKOCYTESUR NEGATIVE 12/12/2016 1730   Sepsis Labs Invalid input(s): PROCALCITONIN,  WBC,  LACTICIDVEN Microbiology Recent Results (from the past 240 hour(s))  Blood culture (routine x  2)     Status: None   Collection Time: 12/12/16  9:54 PM  Result Value Ref Range Status   Specimen Description BLOOD LEFT WRIST  Final   Special Requests IN PEDIATRIC BOTTLE Blood Culture adequate volume  Final   Culture   Final    NO GROWTH 5 DAYS Performed at [facility], [place] [address] [place], [place] [address]    Report Status 12/18/2016 FINAL  Final  Blood culture (routine x 2)     Status: None   Collection Time: 12/12/16  9:59 PM  Result Value Ref Range Status   Specimen Description BLOOD LEFT FOREARM  Final   Special Requests   Final  BOTTLES DRAWN AEROBIC AND ANAEROBIC Blood Culture adequate volume   Culture   Final    NO GROWTH 5 DAYS Performed at [facility], [place] [address], [place], [place] [address]    Report Status 12/18/2016 FINAL  Final     Time coordinating discharge: Over 30 minutes  SIGNED:   Elmarie Shiley, MD  [facility] 12/19/2016, 3:26 PM Pager  [phone] If 7PM-7AM, please contact night-coverage [email or web address] Password [email or web address]

## 2016-12-23 ENCOUNTER — Ambulatory Visit (HOSPITAL_COMMUNITY): Payer: Medicare Other | Admitting: Anesthesiology

## 2016-12-23 ENCOUNTER — Telehealth: Payer: Self-pay

## 2016-12-23 ENCOUNTER — Encounter (HOSPITAL_COMMUNITY)
Admission: RE | Disposition: A | Discharge status: Home / Self Care | Payer: Self-pay | Source: Ambulatory Visit | Attending: Gastroenterology

## 2016-12-23 ENCOUNTER — Encounter (HOSPITAL_COMMUNITY): Payer: Self-pay

## 2016-12-23 ENCOUNTER — Ambulatory Visit (HOSPITAL_COMMUNITY): Payer: Medicare Other

## 2016-12-23 ENCOUNTER — Ambulatory Visit (HOSPITAL_COMMUNITY)
Admission: RE | Admit: 2016-12-23 | Discharge: 2016-12-23 | Disposition: A | Discharge status: Home / Self Care | Payer: Medicare Other | Source: Ambulatory Visit | Attending: Gastroenterology | Admitting: Gastroenterology

## 2016-12-23 DIAGNOSIS — Z86711 Personal history of pulmonary embolism: Secondary | ICD-10-CM | POA: Diagnosis not present

## 2016-12-23 DIAGNOSIS — D6489 Other specified anemias: Secondary | ICD-10-CM | POA: Insufficient documentation

## 2016-12-23 DIAGNOSIS — R945 Abnormal results of liver function studies: Secondary | ICD-10-CM | POA: Insufficient documentation

## 2016-12-23 DIAGNOSIS — R112 Nausea with vomiting, unspecified: Secondary | ICD-10-CM | POA: Diagnosis not present

## 2016-12-23 DIAGNOSIS — R109 Unspecified abdominal pain: Secondary | ICD-10-CM

## 2016-12-23 DIAGNOSIS — K8050 Calculus of bile duct without cholangitis or cholecystitis without obstruction: Secondary | ICD-10-CM | POA: Diagnosis not present

## 2016-12-23 DIAGNOSIS — K219 Gastro-esophageal reflux disease without esophagitis: Secondary | ICD-10-CM | POA: Diagnosis not present

## 2016-12-23 DIAGNOSIS — K449 Diaphragmatic hernia without obstruction or gangrene: Secondary | ICD-10-CM | POA: Insufficient documentation

## 2016-12-23 DIAGNOSIS — R7989 Other specified abnormal findings of blood chemistry: Secondary | ICD-10-CM | POA: Diagnosis not present

## 2016-12-23 DIAGNOSIS — R1013 Epigastric pain: Secondary | ICD-10-CM | POA: Diagnosis not present

## 2016-12-23 DIAGNOSIS — Z881 Allergy status to other antibiotic agents status: Secondary | ICD-10-CM | POA: Insufficient documentation

## 2016-12-23 DIAGNOSIS — Z791 Long term (current) use of non-steroidal anti-inflammatories (NSAID): Secondary | ICD-10-CM | POA: Diagnosis not present

## 2016-12-23 DIAGNOSIS — Z79899 Other long term (current) drug therapy: Secondary | ICD-10-CM | POA: Diagnosis not present

## 2016-12-23 DIAGNOSIS — M1990 Unspecified osteoarthritis, unspecified site: Secondary | ICD-10-CM | POA: Diagnosis not present

## 2016-12-23 DIAGNOSIS — I10 Essential (primary) hypertension: Secondary | ICD-10-CM | POA: Insufficient documentation

## 2016-12-23 DIAGNOSIS — Z888 Allergy status to other drugs, medicaments and biological substances status: Secondary | ICD-10-CM | POA: Diagnosis not present

## 2016-12-23 DIAGNOSIS — R1310 Dysphagia, unspecified: Secondary | ICD-10-CM | POA: Diagnosis not present

## 2016-12-23 DIAGNOSIS — J9610 Chronic respiratory failure, unspecified whether with hypoxia or hypercapnia: Secondary | ICD-10-CM | POA: Insufficient documentation

## 2016-12-23 HISTORY — PX: EUS: SHX5427

## 2016-12-23 HISTORY — PX: ENDOSCOPIC RETROGRADE CHOLANGIOPANCREATOGRAPHY (ERCP) WITH PROPOFOL: SHX5810

## 2016-12-23 SURGERY — UPPER ENDOSCOPIC ULTRASOUND (EUS) LINEAR
Anesthesia: General

## 2016-12-23 MED ORDER — LIDOCAINE 2% (20 MG/ML) 5 ML SYRINGE
INTRAMUSCULAR | Status: DC | PRN
  Administered 2016-12-23: 60 mg via INTRAVENOUS

## 2016-12-23 MED ORDER — ONDANSETRON HCL 4 MG/2ML IJ SOLN
INTRAMUSCULAR | Status: DC | PRN
  Administered 2016-12-23: 4 mg via INTRAVENOUS

## 2016-12-23 MED ORDER — SUCCINYLCHOLINE CHLORIDE 200 MG/10ML IV SOSY
PREFILLED_SYRINGE | INTRAVENOUS | Status: AC
  Filled 2016-12-23: qty 10

## 2016-12-23 MED ORDER — PROPOFOL 10 MG/ML IV BOLUS
INTRAVENOUS | Status: DC | PRN
  Administered 2016-12-23: 120 mg via INTRAVENOUS

## 2016-12-23 MED ORDER — CIPROFLOXACIN IN D5W 400 MG/200ML IV SOLN
INTRAVENOUS | Status: AC
  Filled 2016-12-23: qty 200

## 2016-12-23 MED ORDER — IOPAMIDOL (ISOVUE-M 300) INJECTION 61%: INTRAMUSCULAR | Status: DC | PRN

## 2016-12-23 MED ORDER — LACTATED RINGERS IV SOLN
INTRAVENOUS | Status: DC
  Administered 2016-12-23: 11:00:00 via INTRAVENOUS

## 2016-12-23 MED ORDER — ONDANSETRON HCL 4 MG/2ML IJ SOLN
INTRAMUSCULAR | Status: AC
  Filled 2016-12-23: qty 2

## 2016-12-23 MED ORDER — EPHEDRINE SULFATE 50 MG/ML IJ SOLN
INTRAMUSCULAR | Status: DC | PRN
  Administered 2016-12-23: 5 mg via INTRAVENOUS

## 2016-12-23 MED ORDER — CIPROFLOXACIN IN D5W 400 MG/200ML IV SOLN
400.0000 mg | Freq: Once | INTRAVENOUS | Status: AC
  Administered 2016-12-23: 400 mg via INTRAVENOUS

## 2016-12-23 MED ORDER — SODIUM CHLORIDE 0.9 % IV SOLN
Freq: Once | INTRAVENOUS | Status: AC
  Administered 2016-12-23: 30 mL
  Filled 2016-12-23: qty 50

## 2016-12-23 MED ORDER — PROPOFOL 10 MG/ML IV BOLUS
INTRAVENOUS | Status: AC
  Filled 2016-12-23: qty 20

## 2016-12-23 MED ORDER — SUCCINYLCHOLINE CHLORIDE 200 MG/10ML IV SOSY
PREFILLED_SYRINGE | INTRAVENOUS | Status: DC | PRN
  Administered 2016-12-23: 100 mg via INTRAVENOUS

## 2016-12-23 MED ORDER — SODIUM CHLORIDE 0.9 % IV SOLN: INTRAVENOUS | Status: DC

## 2016-12-23 MED ORDER — INDOMETHACIN 50 MG RE SUPP
RECTAL | Status: AC
  Filled 2016-12-23: qty 2

## 2016-12-23 MED ORDER — GLUCAGON HCL RDNA (DIAGNOSTIC) 1 MG IJ SOLR
INTRAMUSCULAR | Status: AC
  Filled 2016-12-23: qty 1

## 2016-12-23 MED ORDER — INDOMETHACIN 50 MG RE SUPP
RECTAL | Status: DC | PRN
  Administered 2016-12-23: 100 mg via RECTAL

## 2016-12-23 MED ORDER — EPHEDRINE 5 MG/ML INJ
INTRAVENOUS | Status: AC
  Filled 2016-12-23: qty 10

## 2016-12-23 MED ORDER — LIDOCAINE 2% (20 MG/ML) 5 ML SYRINGE
INTRAMUSCULAR | Status: AC
  Filled 2016-12-23: qty 5

## 2016-12-23 NOTE — Transfer of Care (Signed)
Immediate Anesthesia Transfer of Care Note  Patient: [name]  Procedure(s) Performed: Procedure(s): UPPER ENDOSCOPIC ULTRASOUND (EUS) LINEAR (N/A) ENDOSCOPIC RETROGRADE CHOLANGIOPANCREATOGRAPHY (ERCP) WITH PROPOFOL (N/A)  Patient Location: PACU and Endoscopy Unit  Anesthesia Type:General  Level of Consciousness: awake and patient cooperative  Airway & Oxygen Therapy: Patient Spontanous Breathing and Patient connected to face mask oxygen  Post-op Assessment: Report given to RN and Post -op Vital signs reviewed and stable  Post vital signs: Reviewed and stable  Last Vitals:  Vitals:   12/23/16 1131  BP: (!) 183/67  Pulse: (!) 58  Resp: (!) 22  Temp: 36.8 C  SpO2: 95%    Last Pain:  Vitals:   12/23/16 1131  TempSrc: Oral         Complications: No apparent anesthesia complications

## 2016-12-23 NOTE — Op Note (Addendum)
[facility] Patient Name: Tameisha Covell Procedure Date: 12/23/2016 MRN: [id number] Attending MD: Milus Banister , MD Date of Birth: 12/20/1934 CSN: [id number] Age: 82 Admit Type: Outpatient Procedure:                Upper EUS Indications:              history of bile duct stones (2004 ERCP), now with                            intermittent abd pain and transiently elevated                            liver tests; MRI last week did not show clear CBD                            stones, she has a very large hiatal hernia. Providers:                Milus Banister, MD, Cleda Daub, RN, Elspeth Cho Tech., Technician, Dione Booze, CRNA Referring MD:             Zenovia Jarred, MD; Lucio Edward, MD Medicines:                General Anesthesia Complications:            No immediate complications. Estimated blood loss:                            None. Estimated Blood Loss:     Estimated blood loss: none. Procedure:                Pre-Anesthesia Assessment:                           - Prior to the procedure, a History and Physical                            was performed, and patient medications and                            allergies were reviewed. The patient's tolerance of                            previous anesthesia was also reviewed. The risks                            and benefits of the procedure and the sedation                            options and risks were discussed with the patient.                            All questions were answered, and informed consent  was obtained. Prior Anticoagulants: The patient has                            taken no previous anticoagulant or antiplatelet                            agents. ASA Grade Assessment: III - A patient with                            severe systemic disease. After reviewing the risks                            and benefits, the patient was deemed in                             satisfactory condition to undergo the procedure.                           After obtaining informed consent, the endoscope was                            passed under direct vision. Throughout the                            procedure, the patient's blood pressure, pulse, and                            oxygen saturations were monitored continuously. The                            was introduced through the mouth, and advanced to                            the second part of duodenum. The upper EUS was                            accomplished without difficulty. The patient                            tolerated the procedure well. Scope In: Scope Out: Findings:      Endoscopic Finding (limited views with radial echoendoscope) :      A large hiatal hernia was present.      Endosonographic Finding :      1. Very limited views of the extrahepatic biliary tree due to anatomy       (large hiatal hernia).      2. I can not determine whether or not she has recurrent bile duct stones       with any certainty given the limitations above. Impression:               - Signficantly limited exam due to large hiatal                            hernia. I can not determine with any certainty  whether she has recurrent bile duct stones. Moderate Sedation:      N/A- Per Anesthesia Care Recommendation:           - Will attempt ERCP now given the fairly high                            clinical suspicion for retained stones Procedure Code(s):        --- Professional ---                           [phone], Esophagogastroduodenoscopy, flexible,                            transoral; with endoscopic ultrasound examination                            limited to the esophagus, stomach or duodenum, and                            adjacent structures Diagnosis Code(s):        --- Professional ---                           K44.9, Diaphragmatic hernia without obstruction  or                            gangrene                           R74.8, Abnormal levels of other serum enzymes CPT copyright 2016 [employer]. All rights reserved. The codes documented in this report are preliminary and upon coder review may  be revised to meet current compliance requirements. Milus Banister, MD 12/23/2016 12:51:18 PM This report has been signed electronically. Number of Addenda: 0

## 2016-12-23 NOTE — Interval H&P Note (Signed)
History and Physical Interval Note:  12/23/2016 12:11 PM  [name]  has presented today for surgery, with the diagnosis of bile duct stones   The various methods of treatment have been discussed with the patient and family. After consideration of risks, benefits and other options for treatment, the patient has consented to  Procedure(s): UPPER ENDOSCOPIC ULTRASOUND (EUS) LINEAR (N/A) ENDOSCOPIC RETROGRADE CHOLANGIOPANCREATOGRAPHY (ERCP) WITH PROPOFOL (N/A) as a surgical intervention .  The patient's history has been reviewed, patient examined, no change in status, stable for surgery.  I have reviewed the patient's chart and labs.  Questions were answered to the patient's satisfaction.     Milus Banister

## 2016-12-23 NOTE — Telephone Encounter (Signed)
-----   Message from Milus Banister, MD sent at 12/23/2016  1:37 PM EDT ----- Can you send a copy of todays EUS and ERCP to her PCP Dr. Drema Dallas?  Thanks dj

## 2016-12-23 NOTE — Anesthesia Postprocedure Evaluation (Signed)
Anesthesia Post Note  Patient: [name]  Procedure(s) Performed: Procedure(s) (LRB): UPPER ENDOSCOPIC ULTRASOUND (EUS) LINEAR (N/A) ENDOSCOPIC RETROGRADE CHOLANGIOPANCREATOGRAPHY (ERCP) WITH PROPOFOL (N/A)     Patient location during evaluation: PACU Anesthesia Type: General Level of consciousness: awake and alert Pain management: pain level controlled Vital Signs Assessment: post-procedure vital signs reviewed and stable Respiratory status: spontaneous breathing, nonlabored ventilation, respiratory function stable and patient connected to nasal cannula oxygen Cardiovascular status: blood pressure returned to baseline and stable Postop Assessment: no signs of nausea or vomiting Anesthetic complications: no    Last Vitals:  Vitals:   12/23/16 1350 12/23/16 1355  BP: (!) 200/88   Pulse: (!) 58 (!) 59  Resp: 16 14  Temp:    SpO2: 100% 100%    Last Pain:  Vitals:   12/23/16 1325  TempSrc: Oral                 Audry Pili

## 2016-12-23 NOTE — Op Note (Signed)
[facility] Patient Name: [name] Procedure Date: 12/23/2016 MRN: [id number] Attending MD: Milus Banister , MD Date of Birth: 15-Jan-1935 CSN: [id number] Age: 82 Admit Type: Outpatient Procedure:                ERCP Indications:              Abdominal pain and transiently elevated liver                            enzymes, previous CBD stones, inconclusive EUS just                            prior to this examination Providers:                Milus Banister, MD, Cleda Daub, RN, Elspeth Cho Tech., Technician, Dione Booze, CRNA Referring MD:             Zenovia Jarred, MD; Lucio Edward, MD Medicines:                General Anesthesia, Cipro 400 mg IV, Indomethacin                            627 mg PR Complications:            No immediate complications. Estimated blood loss:                            None Estimated Blood Loss:     Estimated blood loss: none. Procedure:                Pre-Anesthesia Assessment:                           - Prior to the procedure, a History and Physical                            was performed, and patient medications and                            allergies were reviewed. The patient's tolerance of                            previous anesthesia was also reviewed. The risks                            and benefits of the procedure and the sedation                            options and risks were discussed with the patient.                            All questions were answered, and informed consent  was obtained. Prior Anticoagulants: The patient has                            taken no previous anticoagulant or antiplatelet                            agents. ASA Grade Assessment: III - A patient with                            severe systemic disease. After reviewing the risks                            and benefits, the patient was deemed in   satisfactory condition to undergo the procedure.                           After obtaining informed consent, the scope was                            passed under direct vision. Throughout the                            procedure, the patient's blood pressure, pulse, and                            oxygen saturations were monitored continuously. The                            [id number]) scope was introduced through                            the mouth, and used to inject contrast into and                            used to inject contrast into the bile duct. The                            ERCP was accomplished without difficulty. The                            patient tolerated the procedure well. Scope In: Scope Out: Findings:      A scout film of the abdomen was obtained. Surgical clips, consistent       with a previous cholecystectomy, were seen in the area of the right       upper quadrant of the abdomen. The esophagus was successfully intubated       under direct vision. The scope was advanced to the major papilla in the       descending duodenum without detailed examination of the pharynx, larynx       and associated structures, and upper GI tract. There was evidence of       previous biliary sphincterotmy present. Thin fluid biodebris was noted       passing from the bile duct into the duodenum even prior to biliary  cannulation. I cannulated the bile duct with a 62 Autotome over a .035       hydrawire and immediately after cannulating with the sphincterotome more       thin biodebris passed from the bile duct into the duodenum. There was no       purulence. Cholangiogram revealed slightly dilated extrahepatic biliary       tree (CBD up to 48mm) without obvious filling defects. I used a 9-1mm       biliary retrieval balloon to sweep the bile duct several times and the       balloon passed across the previous sphincteromy fully inflated quite       easily. More thin  biodebris was delivered into the duodenum. There were       no solid stones or purulence. Impression:               - The previous biliary sphincterotomy is intact and                            allows for passage of a 43mm retreival balloon                            quite easily.                           - Thin fluid biodebris passed from the bile duct                            into the duodenum before and during biliary                            cannulation, balloon retrieval. I suspect she                            refluxes enteric contents across the sphincterotomy                            site and this occasionally causes symptoms. Moderate Sedation:      N/A- Per Anesthesia Care Recommendation:           - Discharge patient to home (ambulatory).                           - If there is again concern for biliary symptoms,                            would consider empiric antibiotic course and                            observation. Procedure Code(s):        --- Professional ---                           [phone], Endoscopic retrograde                            cholangiopancreatography (ERCP); with removal of  calculi/debris from biliary/pancreatic duct(s) Diagnosis Code(s):        --- Professional ---                           R74.8, Abnormal levels of other serum enzymes CPT copyright 2016 American Medical Association. All rights reserved. The codes documented in this report are preliminary and upon coder review may  be revised to meet current compliance requirements. Milus Banister, MD 12/23/2016 1:24:09 PM This report has been signed electronically. Number of Addenda: 0

## 2016-12-23 NOTE — Anesthesia Procedure Notes (Signed)
Procedure Name: Intubation Date/Time: 12/23/2016 12:21 PM Performed by: Dione Booze Pre-anesthesia Checklist: Suction available, Patient being monitored, Emergency Drugs available and Patient identified Patient Re-evaluated:Patient Re-evaluated prior to induction Oxygen Delivery Method: Circle system utilized Preoxygenation: Pre-oxygenation with 100% oxygen Induction Type: IV induction Laryngoscope Size: Mac Grade View: Grade I Tube type: Oral Tube size: 7.5 mm Number of attempts: 1 Airway Equipment and Method: Stylet Placement Confirmation: ETT inserted through vocal cords under direct vision,  positive ETCO2 and breath sounds checked- equal and bilateral Secured at: 22 cm Tube secured with: Tape Dental Injury: Teeth and Oropharynx as per pre-operative assessment

## 2016-12-23 NOTE — Discharge Instructions (Signed)
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
## 2016-12-23 NOTE — Anesthesia Preprocedure Evaluation (Addendum)
Anesthesia Evaluation  Patient identified by MRN, date of birth, ID band Patient awake    Reviewed: Allergy & Precautions, NPO status , Patient's Chart, lab work & pertinent test results  History of Anesthesia Complications (+) PONV  Airway Mallampati: II  TM Distance: >3 FB Neck ROM: Full    Dental  (+) Dental Advisory Given   Pulmonary PE Wears o2 at night   Pulmonary exam normal breath sounds clear to auscultation       Cardiovascular hypertension, +CHF  Normal cardiovascular exam Rhythm:Regular Rate:Normal  TTE 2016 - Left ventricle: Estimated ejection fraction was in the range of 60% to 65%. Grade 1 diastolic dysfunction. No significant valvulopathy  EKG shows RBBB   Neuro/Psych negative neurological ROS  negative psych ROS   GI/Hepatic hiatal hernia, GERD  Medicated and Controlled,Elevated LFTs Dysphagia   Endo/Other  negative endocrine ROS  Renal/GU negative Renal ROS  negative genitourinary   Musculoskeletal  (+) Arthritis ,   Abdominal   Peds negative pediatric ROS (+)  Hematology  (+) anemia ,   Anesthesia Other Findings   Reproductive/Obstetrics negative OB ROS                            Anesthesia Physical  Anesthesia Plan  ASA: III  Anesthesia Plan: General   Post-op Pain Management:    Induction: Intravenous  PONV Risk Score and Plan: 3 and Ondansetron, Propofol infusion and Treatment may vary due to age or medical condition  Airway Management Planned: Oral ETT  Additional Equipment: None  Intra-op Plan:   Post-operative Plan:   Informed Consent: I have reviewed the patients History and Physical, chart, labs and discussed the procedure including the risks, benefits and alternatives for the proposed anesthesia with the patient or authorized representative who has indicated his/her understanding and acceptance.   Dental advisory given  Plan Discussed  with: CRNA  Anesthesia Plan Comments:       Anesthesia Quick Evaluation

## 2016-12-24 ENCOUNTER — Encounter (HOSPITAL_COMMUNITY): Payer: Self-pay | Admitting: Gastroenterology

## 2016-12-24 NOTE — Telephone Encounter (Signed)
The reports have been sent to Dr Drema Dallas

## 2019-08-15 ENCOUNTER — Ambulatory Visit: Payer: Medicare Other | Admitting: Gastroenterology

## 2019-08-21 DIAGNOSIS — E878 Other disorders of electrolyte and fluid balance, not elsewhere classified: Secondary | ICD-10-CM | POA: Diagnosis not present

## 2019-09-10 ENCOUNTER — Encounter: Payer: Self-pay | Admitting: Gastroenterology

## 2019-09-10 ENCOUNTER — Ambulatory Visit: Payer: Medicare PPO | Admitting: Gastroenterology

## 2019-09-10 VITALS — BP 118/60 | HR 88 | Ht 61.0 in | Wt 179.0 lb

## 2019-09-10 DIAGNOSIS — K449 Diaphragmatic hernia without obstruction or gangrene: Secondary | ICD-10-CM | POA: Diagnosis not present

## 2019-09-10 DIAGNOSIS — R1310 Dysphagia, unspecified: Secondary | ICD-10-CM

## 2019-09-10 NOTE — Progress Notes (Signed)
    History of Present Illness: This is an 85 year old female with dysphagia.  She has a history of a large paraesophageal hernia and esophageal stricture.  EGD with dilation performed in May 2018.  She states she has not had significant swallowing difficulties until the past few months.  She noted 2 occasions where solid foods appeared to temporarily lodged causing along discomfort and a sense of fullness for 15 to 20 minutes prior to passing.  She has no ongoing reflux symptoms and takes omeprazole on a daily basis.  CBC from Dr. Drema Dallas office revealed hemoglobin 11.7, MCV 96.7.  Colonoscopy performed in October 2003 showed mild left colon diverticulosis.  Patient on home O2 for chronic respiratory failure.  Current Medications, Allergies, Past Medical History, Past Surgical History, Family History and Social History were reviewed in [employer] record.   Physical Exam: General: Well developed, well nourished, no acute distress Head: Normocephalic and atraumatic Eyes:  sclerae anicteric, EOMI Ears: Normal auditory acuity Mouth: Not examined, mask on during Covid-19 pandemic Lungs: Clear throughout to auscultation Heart: Regular rate and rhythm; no murmurs, rubs or bruits Abdomen: Soft, non tender and non distended. No masses, hepatosplenomegaly or hernias noted. Normal Bowel sounds Rectal: Not done Musculoskeletal: Symmetrical with no gross deformities  Pulses:  Normal pulses noted Extremities: No clubbing, cyanosis, edema or deformities noted Neurological: Alert oriented x 4, grossly nonfocal Psychological:  Alert and cooperative. Normal mood and affect   Assessment and Recommendations:  1.  Solid food dysphagia.  Large paraesophageal hernia. Suspected recurrent esophageal stricture.  Symptoms could be related to large [place] without a stricture.  Patient declined surgical referral for evaluation.  Continue omeprazole 20 mg daily.  Follow standard antireflux measures.   Schedule EGD with possible dilation. The risks (including bleeding, perforation, infection, missed lesions, medication reactions and possible hospitalization or surgery if complications occur), benefits, and alternatives to endoscopy with possible biopsy and possible dilation were discussed with the patient and they consent to proceed.   2.  Normocytic anemia.  Follow-up per Dr. Drema Dallas.  3.  Chronic resp failure on home O2.

## 2019-09-10 NOTE — H&P (View-Only) (Signed)
    History of Present Illness: This is an 85 year old female with dysphagia.  She has a history of a large paraesophageal hernia and esophageal stricture.  EGD with dilation performed in May 2018.  She states she has not had significant swallowing difficulties until the past few months.  She noted 2 occasions where solid foods appeared to temporarily lodged causing along discomfort and a sense of fullness for 15 to 20 minutes prior to passing.  She has no ongoing reflux symptoms and takes omeprazole on a daily basis.  CBC from Dr. Drema Dallas office revealed hemoglobin 11.7, MCV 96.7.  Colonoscopy performed in October 2003 showed mild left colon diverticulosis.  Patient on home O2 for chronic respiratory failure.  Current Medications, Allergies, Past Medical History, Past Surgical History, Family History and Social History were reviewed in [employer] record.   Physical Exam: General: Well developed, well nourished, no acute distress Head: Normocephalic and atraumatic Eyes:  sclerae anicteric, EOMI Ears: Normal auditory acuity Mouth: Not examined, mask on during Covid-19 pandemic Lungs: Clear throughout to auscultation Heart: Regular rate and rhythm; no murmurs, rubs or bruits Abdomen: Soft, non tender and non distended. No masses, hepatosplenomegaly or hernias noted. Normal Bowel sounds Rectal: Not done Musculoskeletal: Symmetrical with no gross deformities  Pulses:  Normal pulses noted Extremities: No clubbing, cyanosis, edema or deformities noted Neurological: Alert oriented x 4, grossly nonfocal Psychological:  Alert and cooperative. Normal mood and affect   Assessment and Recommendations:  1.  Solid food dysphagia.  Large paraesophageal hernia. Suspected recurrent esophageal stricture.  Symptoms could be related to large [place] without a stricture.  Patient declined surgical referral for evaluation.  Continue omeprazole 20 mg daily.  Follow standard antireflux measures.   Schedule EGD with possible dilation. The risks (including bleeding, perforation, infection, missed lesions, medication reactions and possible hospitalization or surgery if complications occur), benefits, and alternatives to endoscopy with possible biopsy and possible dilation were discussed with the patient and they consent to proceed.   2.  Normocytic anemia.  Follow-up per Dr. Drema Dallas.  3.  Chronic resp failure on home O2.

## 2019-09-10 NOTE — Patient Instructions (Addendum)
You have been scheduled for an endoscopy. Please follow written instructions given to you at your visit today. If you use inhalers (even only as needed), please bring them with you on the day of your procedure.  Thank you for choosing me and [facility].  Pricilla Riffle. Dagoberto Ligas., MD., Marval Regal

## 2019-09-11 DIAGNOSIS — R0789 Other chest pain: Secondary | ICD-10-CM | POA: Diagnosis not present

## 2019-09-11 DIAGNOSIS — I2609 Other pulmonary embolism with acute cor pulmonale: Secondary | ICD-10-CM | POA: Diagnosis not present

## 2019-09-11 DIAGNOSIS — J9690 Respiratory failure, unspecified, unspecified whether with hypoxia or hypercapnia: Secondary | ICD-10-CM | POA: Diagnosis not present

## 2019-09-11 DIAGNOSIS — J918 Pleural effusion in other conditions classified elsewhere: Secondary | ICD-10-CM | POA: Diagnosis not present

## 2019-09-21 ENCOUNTER — Other Ambulatory Visit (HOSPITAL_COMMUNITY)
Admission: RE | Admit: 2019-09-21 | Discharge: 2019-09-21 | Disposition: A | Discharge status: Home / Self Care | Payer: Medicare PPO | Source: Ambulatory Visit | Attending: Gastroenterology | Admitting: Gastroenterology

## 2019-09-21 DIAGNOSIS — Z20822 Contact with and (suspected) exposure to covid-19: Secondary | ICD-10-CM | POA: Diagnosis not present

## 2019-09-21 DIAGNOSIS — Z01812 Encounter for preprocedural laboratory examination: Secondary | ICD-10-CM | POA: Diagnosis not present

## 2019-09-21 LAB — SARS CORONAVIRUS 2 (TAT 6-24 HRS): SARS Coronavirus 2: NEGATIVE

## 2019-09-25 ENCOUNTER — Other Ambulatory Visit: Payer: Self-pay

## 2019-09-25 ENCOUNTER — Encounter (HOSPITAL_COMMUNITY)
Admission: RE | Disposition: A | Discharge status: Home / Self Care | Payer: Self-pay | Source: Home / Self Care | Attending: Gastroenterology

## 2019-09-25 ENCOUNTER — Ambulatory Visit (HOSPITAL_COMMUNITY): Payer: Medicare PPO | Admitting: Anesthesiology

## 2019-09-25 ENCOUNTER — Ambulatory Visit (HOSPITAL_COMMUNITY)
Admission: RE | Admit: 2019-09-25 | Discharge: 2019-09-25 | Disposition: A | Discharge status: Home / Self Care | Payer: Medicare PPO | Attending: Gastroenterology | Admitting: Gastroenterology

## 2019-09-25 DIAGNOSIS — Z79899 Other long term (current) drug therapy: Secondary | ICD-10-CM | POA: Insufficient documentation

## 2019-09-25 DIAGNOSIS — Z9981 Dependence on supplemental oxygen: Secondary | ICD-10-CM | POA: Insufficient documentation

## 2019-09-25 DIAGNOSIS — K222 Esophageal obstruction: Secondary | ICD-10-CM | POA: Insufficient documentation

## 2019-09-25 DIAGNOSIS — Z7982 Long term (current) use of aspirin: Secondary | ICD-10-CM | POA: Insufficient documentation

## 2019-09-25 DIAGNOSIS — K219 Gastro-esophageal reflux disease without esophagitis: Secondary | ICD-10-CM

## 2019-09-25 DIAGNOSIS — M1990 Unspecified osteoarthritis, unspecified site: Secondary | ICD-10-CM | POA: Diagnosis not present

## 2019-09-25 DIAGNOSIS — D649 Anemia, unspecified: Secondary | ICD-10-CM | POA: Diagnosis not present

## 2019-09-25 DIAGNOSIS — J9610 Chronic respiratory failure, unspecified whether with hypoxia or hypercapnia: Secondary | ICD-10-CM | POA: Insufficient documentation

## 2019-09-25 DIAGNOSIS — I509 Heart failure, unspecified: Secondary | ICD-10-CM | POA: Insufficient documentation

## 2019-09-25 DIAGNOSIS — K449 Diaphragmatic hernia without obstruction or gangrene: Secondary | ICD-10-CM | POA: Diagnosis not present

## 2019-09-25 DIAGNOSIS — K5730 Diverticulosis of large intestine without perforation or abscess without bleeding: Secondary | ICD-10-CM | POA: Insufficient documentation

## 2019-09-25 DIAGNOSIS — R1310 Dysphagia, unspecified: Secondary | ICD-10-CM

## 2019-09-25 DIAGNOSIS — I5032 Chronic diastolic (congestive) heart failure: Secondary | ICD-10-CM | POA: Diagnosis not present

## 2019-09-25 DIAGNOSIS — I4510 Unspecified right bundle-branch block: Secondary | ICD-10-CM | POA: Diagnosis not present

## 2019-09-25 DIAGNOSIS — I110 Hypertensive heart disease with heart failure: Secondary | ICD-10-CM | POA: Diagnosis not present

## 2019-09-25 HISTORY — PX: ESOPHAGOGASTRODUODENOSCOPY (EGD) WITH PROPOFOL: SHX5813

## 2019-09-25 SURGERY — ESOPHAGOGASTRODUODENOSCOPY (EGD) WITH PROPOFOL
Anesthesia: Monitor Anesthesia Care

## 2019-09-25 MED ORDER — LIDOCAINE 2% (20 MG/ML) 5 ML SYRINGE
INTRAMUSCULAR | Status: DC | PRN
  Administered 2019-09-25: 40 mg via INTRAVENOUS

## 2019-09-25 MED ORDER — SODIUM CHLORIDE 0.9 % IV SOLN: INTRAVENOUS | Status: DC

## 2019-09-25 MED ORDER — ALBUTEROL SULFATE (2.5 MG/3ML) 0.083% IN NEBU
2.5000 mg | INHALATION_SOLUTION | Freq: Once | RESPIRATORY_TRACT | Status: AC
  Administered 2019-09-25: 2.5 mg via RESPIRATORY_TRACT

## 2019-09-25 MED ORDER — ALBUTEROL SULFATE (2.5 MG/3ML) 0.083% IN NEBU
INHALATION_SOLUTION | RESPIRATORY_TRACT | Status: AC
  Filled 2019-09-25: qty 3

## 2019-09-25 MED ORDER — LACTATED RINGERS IV SOLN
INTRAVENOUS | Status: AC | PRN
  Administered 2019-09-25: 1000 mL via INTRAVENOUS

## 2019-09-25 MED ORDER — LACTATED RINGERS IV SOLN: INTRAVENOUS | Status: DC

## 2019-09-25 MED ORDER — ONDANSETRON HCL 4 MG/2ML IJ SOLN
INTRAMUSCULAR | Status: DC | PRN
  Administered 2019-09-25: 4 mg via INTRAVENOUS

## 2019-09-25 MED ORDER — PROPOFOL 500 MG/50ML IV EMUL
INTRAVENOUS | Status: DC | PRN
  Administered 2019-09-25: 125 ug/kg/min via INTRAVENOUS

## 2019-09-25 MED ORDER — PROPOFOL 10 MG/ML IV BOLUS
INTRAVENOUS | Status: DC | PRN
  Administered 2019-09-25: 20 mg via INTRAVENOUS

## 2019-09-25 SURGICAL SUPPLY — 14 items

## 2019-09-25 NOTE — Interval H&P Note (Signed)
History and Physical Interval Note:  09/25/2019 12:36 PM  [name]  has presented today for surgery, with the diagnosis of Dysphagia.  The various methods of treatment have been discussed with the patient and family. After consideration of risks, benefits and other options for treatment, the patient has consented to  Procedure(s): ESOPHAGOGASTRODUODENOSCOPY (EGD) WITH PROPOFOL (N/A) as a surgical intervention.  The patient's history has been reviewed, patient examined, no change in status, stable for surgery.  I have reviewed the patient's chart and labs.  Questions were answered to the patient's satisfaction.     Pricilla Riffle. Fuller Plan

## 2019-09-25 NOTE — Anesthesia Postprocedure Evaluation (Signed)
Anesthesia Post Note  Patient: [name]  Procedure(s) Performed: ESOPHAGOGASTRODUODENOSCOPY (EGD) WITH PROPOFOL (N/A ) ESOPHAGEAL DILITATION (N/A )     Patient location during evaluation: PACU Anesthesia Type: MAC Level of consciousness: awake and alert Pain management: pain level controlled Vital Signs Assessment: post-procedure vital signs reviewed and stable Respiratory status: spontaneous breathing Cardiovascular status: stable Anesthetic complications: no    Last Vitals:  Vitals:   09/25/19 1410 09/25/19 1420  BP: (!) 167/68 (!) 151/66  Pulse:    Resp: 11 18  Temp:    SpO2:      Last Pain:  Vitals:   09/25/19 1420  TempSrc:   PainSc: 0-No pain                 Nolon Nations

## 2019-09-25 NOTE — Discharge Instructions (Signed)
YOU HAD AN ENDOSCOPIC PROCEDURE TODAY: Refer to the procedure report and other information in the discharge instructions given to you for any specific questions about what was found during the examination. If this information does not answer your questions, please call [place] office at [phone] to clarify.   YOU SHOULD EXPECT: Some feelings of bloating in the abdomen. Passage of more gas than usual. Walking can help get rid of the air that was put into your GI tract during the procedure and reduce the bloating. If you had a lower endoscopy (such as a colonoscopy or flexible sigmoidoscopy) you may notice spotting of blood in your stool or on the toilet paper. Some abdominal soreness may be present for a day or two, also.  DIET: Clear liquid diet for 2 hours,then advance as tolerated to soft diet today. Resume prior diet tomorrow.l. Heavy or fried foods are harder to digest and may make you feel nauseous or bloated. Drink plenty of fluids but you should avoid alcoholic beverages for 24 hours. If you had a esophageal dilation, please see attached instructions for diet.    ACTIVITY: Your care partner should take you home directly after the procedure. You should plan to take it easy, moving slowly for the rest of the day. You can resume normal activity the day after the procedure however YOU SHOULD NOT DRIVE, use power tools, machinery or perform tasks that involve climbing or major physical exertion for 24 hours (because of the sedation medicines used during the test).   SYMPTOMS TO REPORT IMMEDIATELY: A gastroenterologist can be reached at any hour. Please call [phone]  for any of the following symptoms:  Following lower endoscopy (colonoscopy, flexible sigmoidoscopy) Excessive amounts of blood in the stool  Significant tenderness, worsening of abdominal pains  Swelling of the abdomen that is new, acute  Fever of 100 or higher  Following upper endoscopy (EGD, EUS, ERCP, esophageal  dilation) Vomiting of blood or coffee ground material  New, significant abdominal pain  New, significant chest pain or pain under the shoulder blades  Painful or persistently difficult swallowing  New shortness of breath  Black, tarry-looking or red, bloody stools  FOLLOW UP:  If any biopsies were taken you will be contacted by phone or by letter within the next 1-3 weeks. Call [phone]  if you have not heard about the biopsies in 3 weeks.  Please also call with any specific questions about appointments or follow up tests.

## 2019-09-25 NOTE — Transfer of Care (Signed)
Immediate Anesthesia Transfer of Care Note  Patient: [name]  Procedure(s) Performed: ESOPHAGOGASTRODUODENOSCOPY (EGD) WITH PROPOFOL (N/A ) ESOPHAGEAL DILITATION (N/A )  Patient Location: PACU and Endoscopy Unit  Anesthesia Type:MAC  Level of Consciousness: awake, alert  and oriented  Airway & Oxygen Therapy: Patient Spontanous Breathing and Patient connected to face mask oxygen  Post-op Assessment: Report given to RN and Post -op Vital signs reviewed and stable  Post vital signs: Reviewed and stable  Last Vitals:  Vitals Value Taken Time  BP    Temp    Pulse    Resp 21 09/25/19 1331  SpO2    Vitals shown include unvalidated device data.  Last Pain:  Vitals:   09/25/19 1203  TempSrc: Oral  PainSc: 0-No pain         Complications: No apparent anesthesia complications

## 2019-09-25 NOTE — Op Note (Signed)
[facility] Patient Name: [name] Procedure Date: 09/25/2019 MRN: [id number] Attending MD: Ladene Artist , MD Date of Birth: [birth year] CSN: [id number] Age: 85 Admit Type: Outpatient Procedure:                Upper GI endoscopy Indications:              Dysphagia Providers:                Pricilla Riffle. Fuller Plan, MD, Benay Pillow, RN, Angus Seller, Virgia Land, CRNA Referring MD:             Rudean Hitt MD Medicines:                Monitored Anesthesia Care Complications:            No immediate complications. Estimated Blood Loss:     Estimated blood loss was minimal. Procedure:                Pre-Anesthesia Assessment:                           - Prior to the procedure, a History and Physical                            was performed, and patient medications and                            allergies were reviewed. The patient's tolerance of                            previous anesthesia was also reviewed. The risks                            and benefits of the procedure and the sedation                            options and risks were discussed with the patient.                            All questions were answered, and informed consent                            was obtained. Prior Anticoagulants: The patient has                            taken no previous anticoagulant or antiplatelet                            agents. ASA Grade Assessment: III - A patient with                            severe systemic disease. After reviewing the risks  and benefits, the patient was deemed in                            satisfactory condition to undergo the procedure.                           After obtaining informed consent, the endoscope was                            passed under direct vision. Throughout the                            procedure, the patient's blood pressure, pulse, and     oxygen saturations were monitored continuously. The                            GIF-H190 ([id number]) Olympus gastroscope was                            introduced through the mouth, and advanced to the                            second part of duodenum. The upper GI endoscopy was                            accomplished without difficulty. The patient                            tolerated the procedure well. Scope In: Scope Out: Findings:      One benign-appearing, intrinsic moderate stenosis was found 31 cm from       the incisors. This stenosis measured 1 cm (inner diameter) x less than       one cm (in length). The stenosis was traversed. A TTS dilator was passed       through the scope. Dilation with a 12-13.5-15 mm balloon dilator was       performed to 15 mm. The dilation site was examined following endoscope       reinsertion and showed mild mucosal disruption and moderate improvement       in luminal narrowing.      The exam of the esophagus was otherwise normal.      A large hiatal hernia was present.      The exam of the stomach was otherwise normal.      The duodenal bulb and second portion of the duodenum were normal. Impression:               - Benign-appearing esophageal stenosis. Dilated.                           - Large hiatal hernia.                           - Normal duodenal bulb and second portion of the                            duodenum.                           -  No specimens collected. Moderate Sedation:      Not Applicable - Patient had care per Anesthesia. Recommendation:           - Patient has a contact number available for                            emergencies. The signs and symptoms of potential                            delayed complications were discussed with the                            patient. Return to normal activities tomorrow.                            Written discharge instructions were provided to the                            patient.                            - Clear liquid diet for 2 hours, then advance as                            tolerated to soft diet today.                           - Resume prior diet tomorrow.                           - Antireflux measures long term.                           - Continue present medications.                           - Return to GI office in 1 month. Procedure Code(s):        --- Professional ---                           [id number], Esophagogastroduodenoscopy, flexible,                            transoral; with transendoscopic balloon dilation of                            esophagus (less than 30 mm diameter) Diagnosis Code(s):        --- Professional ---                           K22.2, Esophageal obstruction                           K44.9, Diaphragmatic hernia without obstruction or  gangrene                           R13.10, Dysphagia, unspecified CPT copyright 2019 [employer]. All rights reserved. The codes documented in this report are preliminary and upon coder review may  be revised to meet current compliance requirements. Ladene Artist, MD 09/25/2019 1:28:19 PM This report has been signed electronically. Number of Addenda: 0

## 2019-09-25 NOTE — Anesthesia Preprocedure Evaluation (Signed)
Anesthesia Evaluation  Patient identified by MRN, date of birth, ID band Patient awake    Reviewed: Allergy & Precautions, NPO status , Patient's Chart, lab work & pertinent test results  History of Anesthesia Complications (+) PONV and history of anesthetic complications  Airway Mallampati: II  TM Distance: >3 FB Neck ROM: Full    Dental  (+) Dental Advisory Given   Pulmonary shortness of breath, PE Wears o2 at night   Pulmonary exam normal breath sounds clear to auscultation       Cardiovascular hypertension, +CHF  Normal cardiovascular exam Rhythm:Regular Rate:Normal  TTE 2016 - Left ventricle: Estimated ejection fraction was in the range of 60% to 65%. Grade 1 diastolic dysfunction. No significant valvulopathy  EKG shows RBBB   Neuro/Psych negative neurological ROS  negative psych ROS   GI/Hepatic hiatal hernia, GERD  Medicated and Controlled,Elevated LFTs Dysphagia   Endo/Other  negative endocrine ROS  Renal/GU negative Renal ROS     Musculoskeletal  (+) Arthritis ,   Abdominal   Peds  Hematology  (+) anemia ,   Anesthesia Other Findings   Reproductive/Obstetrics negative OB ROS                             Anesthesia Physical  Anesthesia Plan  ASA: III  Anesthesia Plan: MAC   Post-op Pain Management:    Induction: Intravenous  PONV Risk Score and Plan: 3 and Ondansetron, Propofol infusion, Treatment may vary due to age or medical condition and TIVA  Airway Management Planned:   Additional Equipment:   Intra-op Plan:   Post-operative Plan:   Informed Consent: I have reviewed the patients History and Physical, chart, labs and discussed the procedure including the risks, benefits and alternatives for the proposed anesthesia with the patient or authorized representative who has indicated his/her understanding and acceptance.     Dental advisory given  Plan  Discussed with: CRNA  Anesthesia Plan Comments:         Anesthesia Quick Evaluation

## 2019-09-25 NOTE — Anesthesia Procedure Notes (Signed)
Procedure Name: MAC Date/Time: 09/25/2019 1:06 PM Performed by: Maxwell Caul, CRNA Pre-anesthesia Checklist: Patient identified, Emergency Drugs available, Suction available and Patient being monitored Oxygen Delivery Method: Simple face mask

## 2019-09-26 ENCOUNTER — Encounter: Payer: Self-pay | Admitting: *Deleted

## 2019-09-26 DIAGNOSIS — N3946 Mixed incontinence: Secondary | ICD-10-CM | POA: Diagnosis not present

## 2019-09-26 DIAGNOSIS — N3020 Other chronic cystitis without hematuria: Secondary | ICD-10-CM | POA: Diagnosis not present

## 2019-10-01 DIAGNOSIS — M810 Age-related osteoporosis without current pathological fracture: Secondary | ICD-10-CM | POA: Diagnosis not present

## 2019-10-02 DIAGNOSIS — Z6835 Body mass index (BMI) 35.0-35.9, adult: Secondary | ICD-10-CM | POA: Diagnosis not present

## 2019-10-02 DIAGNOSIS — M810 Age-related osteoporosis without current pathological fracture: Secondary | ICD-10-CM | POA: Diagnosis not present

## 2019-10-02 DIAGNOSIS — E041 Nontoxic single thyroid nodule: Secondary | ICD-10-CM | POA: Diagnosis not present

## 2019-10-02 DIAGNOSIS — I10 Essential (primary) hypertension: Secondary | ICD-10-CM | POA: Diagnosis not present

## 2019-10-12 DIAGNOSIS — J9690 Respiratory failure, unspecified, unspecified whether with hypoxia or hypercapnia: Secondary | ICD-10-CM | POA: Diagnosis not present

## 2019-10-12 DIAGNOSIS — I2609 Other pulmonary embolism with acute cor pulmonale: Secondary | ICD-10-CM | POA: Diagnosis not present

## 2019-10-12 DIAGNOSIS — J918 Pleural effusion in other conditions classified elsewhere: Secondary | ICD-10-CM | POA: Diagnosis not present

## 2019-10-12 DIAGNOSIS — R0789 Other chest pain: Secondary | ICD-10-CM | POA: Diagnosis not present

## 2019-10-16 DIAGNOSIS — M1711 Unilateral primary osteoarthritis, right knee: Secondary | ICD-10-CM | POA: Diagnosis not present

## 2019-10-16 DIAGNOSIS — M1712 Unilateral primary osteoarthritis, left knee: Secondary | ICD-10-CM | POA: Diagnosis not present

## 2019-10-16 DIAGNOSIS — M170 Bilateral primary osteoarthritis of knee: Secondary | ICD-10-CM | POA: Diagnosis not present

## 2019-10-16 DIAGNOSIS — M25562 Pain in left knee: Secondary | ICD-10-CM | POA: Diagnosis not present

## 2019-10-16 DIAGNOSIS — M25572 Pain in left ankle and joints of left foot: Secondary | ICD-10-CM | POA: Diagnosis not present

## 2019-10-16 DIAGNOSIS — M25561 Pain in right knee: Secondary | ICD-10-CM | POA: Diagnosis not present

## 2019-11-11 DIAGNOSIS — J9690 Respiratory failure, unspecified, unspecified whether with hypoxia or hypercapnia: Secondary | ICD-10-CM | POA: Diagnosis not present

## 2019-11-11 DIAGNOSIS — R0789 Other chest pain: Secondary | ICD-10-CM | POA: Diagnosis not present

## 2019-11-11 DIAGNOSIS — J918 Pleural effusion in other conditions classified elsewhere: Secondary | ICD-10-CM | POA: Diagnosis not present

## 2019-11-11 DIAGNOSIS — I2609 Other pulmonary embolism with acute cor pulmonale: Secondary | ICD-10-CM | POA: Diagnosis not present

## 2019-11-21 DIAGNOSIS — N3020 Other chronic cystitis without hematuria: Secondary | ICD-10-CM | POA: Diagnosis not present

## 2019-11-21 DIAGNOSIS — N3946 Mixed incontinence: Secondary | ICD-10-CM | POA: Diagnosis not present

## 2019-12-12 DIAGNOSIS — R0789 Other chest pain: Secondary | ICD-10-CM | POA: Diagnosis not present

## 2019-12-12 DIAGNOSIS — I2609 Other pulmonary embolism with acute cor pulmonale: Secondary | ICD-10-CM | POA: Diagnosis not present

## 2019-12-12 DIAGNOSIS — J918 Pleural effusion in other conditions classified elsewhere: Secondary | ICD-10-CM | POA: Diagnosis not present

## 2019-12-12 DIAGNOSIS — J9690 Respiratory failure, unspecified, unspecified whether with hypoxia or hypercapnia: Secondary | ICD-10-CM | POA: Diagnosis not present

## 2020-01-12 DIAGNOSIS — R0789 Other chest pain: Secondary | ICD-10-CM | POA: Diagnosis not present

## 2020-01-12 DIAGNOSIS — J9690 Respiratory failure, unspecified, unspecified whether with hypoxia or hypercapnia: Secondary | ICD-10-CM | POA: Diagnosis not present

## 2020-01-12 DIAGNOSIS — I2609 Other pulmonary embolism with acute cor pulmonale: Secondary | ICD-10-CM | POA: Diagnosis not present

## 2020-01-12 DIAGNOSIS — J918 Pleural effusion in other conditions classified elsewhere: Secondary | ICD-10-CM | POA: Diagnosis not present

## 2020-01-17 DIAGNOSIS — R7303 Prediabetes: Secondary | ICD-10-CM | POA: Diagnosis not present

## 2020-01-17 DIAGNOSIS — R609 Edema, unspecified: Secondary | ICD-10-CM | POA: Diagnosis not present

## 2020-01-17 DIAGNOSIS — G4734 Idiopathic sleep related nonobstructive alveolar hypoventilation: Secondary | ICD-10-CM | POA: Diagnosis not present

## 2020-01-17 DIAGNOSIS — E559 Vitamin D deficiency, unspecified: Secondary | ICD-10-CM | POA: Diagnosis not present

## 2020-01-17 DIAGNOSIS — Z86711 Personal history of pulmonary embolism: Secondary | ICD-10-CM | POA: Diagnosis not present

## 2020-01-17 DIAGNOSIS — I10 Essential (primary) hypertension: Secondary | ICD-10-CM | POA: Diagnosis not present

## 2020-01-17 DIAGNOSIS — M810 Age-related osteoporosis without current pathological fracture: Secondary | ICD-10-CM | POA: Diagnosis not present

## 2020-01-17 DIAGNOSIS — K219 Gastro-esophageal reflux disease without esophagitis: Secondary | ICD-10-CM | POA: Diagnosis not present

## 2020-01-17 DIAGNOSIS — Z0000 Encounter for general adult medical examination without abnormal findings: Secondary | ICD-10-CM | POA: Diagnosis not present

## 2020-01-22 DIAGNOSIS — M25551 Pain in right hip: Secondary | ICD-10-CM | POA: Diagnosis not present

## 2020-01-22 DIAGNOSIS — R262 Difficulty in walking, not elsewhere classified: Secondary | ICD-10-CM | POA: Diagnosis not present

## 2020-01-22 DIAGNOSIS — M545 Low back pain, unspecified: Secondary | ICD-10-CM | POA: Diagnosis not present

## 2020-01-22 DIAGNOSIS — R2681 Unsteadiness on feet: Secondary | ICD-10-CM | POA: Diagnosis not present

## 2020-01-22 DIAGNOSIS — R531 Weakness: Secondary | ICD-10-CM | POA: Diagnosis not present

## 2020-01-22 DIAGNOSIS — M6281 Muscle weakness (generalized): Secondary | ICD-10-CM | POA: Diagnosis not present

## 2020-01-25 DIAGNOSIS — R2681 Unsteadiness on feet: Secondary | ICD-10-CM | POA: Diagnosis not present

## 2020-01-25 DIAGNOSIS — M545 Low back pain, unspecified: Secondary | ICD-10-CM | POA: Diagnosis not present

## 2020-01-25 DIAGNOSIS — R531 Weakness: Secondary | ICD-10-CM | POA: Diagnosis not present

## 2020-01-25 DIAGNOSIS — R262 Difficulty in walking, not elsewhere classified: Secondary | ICD-10-CM | POA: Diagnosis not present

## 2020-01-25 DIAGNOSIS — M25551 Pain in right hip: Secondary | ICD-10-CM | POA: Diagnosis not present

## 2020-01-25 DIAGNOSIS — M6281 Muscle weakness (generalized): Secondary | ICD-10-CM | POA: Diagnosis not present

## 2020-01-28 DIAGNOSIS — R262 Difficulty in walking, not elsewhere classified: Secondary | ICD-10-CM | POA: Diagnosis not present

## 2020-01-28 DIAGNOSIS — M25551 Pain in right hip: Secondary | ICD-10-CM | POA: Diagnosis not present

## 2020-01-28 DIAGNOSIS — M545 Low back pain, unspecified: Secondary | ICD-10-CM | POA: Diagnosis not present

## 2020-01-28 DIAGNOSIS — M6281 Muscle weakness (generalized): Secondary | ICD-10-CM | POA: Diagnosis not present

## 2020-01-28 DIAGNOSIS — R2681 Unsteadiness on feet: Secondary | ICD-10-CM | POA: Diagnosis not present

## 2020-01-28 DIAGNOSIS — R531 Weakness: Secondary | ICD-10-CM | POA: Diagnosis not present

## 2020-01-30 DIAGNOSIS — R531 Weakness: Secondary | ICD-10-CM | POA: Diagnosis not present

## 2020-01-30 DIAGNOSIS — M545 Low back pain, unspecified: Secondary | ICD-10-CM | POA: Diagnosis not present

## 2020-01-30 DIAGNOSIS — M6281 Muscle weakness (generalized): Secondary | ICD-10-CM | POA: Diagnosis not present

## 2020-01-30 DIAGNOSIS — R2681 Unsteadiness on feet: Secondary | ICD-10-CM | POA: Diagnosis not present

## 2020-01-30 DIAGNOSIS — M25551 Pain in right hip: Secondary | ICD-10-CM | POA: Diagnosis not present

## 2020-01-30 DIAGNOSIS — R262 Difficulty in walking, not elsewhere classified: Secondary | ICD-10-CM | POA: Diagnosis not present

## 2020-01-31 DIAGNOSIS — M810 Age-related osteoporosis without current pathological fracture: Secondary | ICD-10-CM | POA: Diagnosis not present

## 2020-01-31 DIAGNOSIS — Z780 Asymptomatic menopausal state: Secondary | ICD-10-CM | POA: Diagnosis not present

## 2020-02-01 DIAGNOSIS — R262 Difficulty in walking, not elsewhere classified: Secondary | ICD-10-CM | POA: Diagnosis not present

## 2020-02-01 DIAGNOSIS — M25551 Pain in right hip: Secondary | ICD-10-CM | POA: Diagnosis not present

## 2020-02-01 DIAGNOSIS — R531 Weakness: Secondary | ICD-10-CM | POA: Diagnosis not present

## 2020-02-01 DIAGNOSIS — M545 Low back pain, unspecified: Secondary | ICD-10-CM | POA: Diagnosis not present

## 2020-02-01 DIAGNOSIS — R2681 Unsteadiness on feet: Secondary | ICD-10-CM | POA: Diagnosis not present

## 2020-02-01 DIAGNOSIS — M6281 Muscle weakness (generalized): Secondary | ICD-10-CM | POA: Diagnosis not present

## 2020-02-04 DIAGNOSIS — R2681 Unsteadiness on feet: Secondary | ICD-10-CM | POA: Diagnosis not present

## 2020-02-04 DIAGNOSIS — R262 Difficulty in walking, not elsewhere classified: Secondary | ICD-10-CM | POA: Diagnosis not present

## 2020-02-04 DIAGNOSIS — M6281 Muscle weakness (generalized): Secondary | ICD-10-CM | POA: Diagnosis not present

## 2020-02-04 DIAGNOSIS — R531 Weakness: Secondary | ICD-10-CM | POA: Diagnosis not present

## 2020-02-04 DIAGNOSIS — M545 Low back pain, unspecified: Secondary | ICD-10-CM | POA: Diagnosis not present

## 2020-02-04 DIAGNOSIS — M25551 Pain in right hip: Secondary | ICD-10-CM | POA: Diagnosis not present

## 2020-02-06 DIAGNOSIS — R531 Weakness: Secondary | ICD-10-CM | POA: Diagnosis not present

## 2020-02-06 DIAGNOSIS — M6281 Muscle weakness (generalized): Secondary | ICD-10-CM | POA: Diagnosis not present

## 2020-02-06 DIAGNOSIS — M25551 Pain in right hip: Secondary | ICD-10-CM | POA: Diagnosis not present

## 2020-02-06 DIAGNOSIS — M545 Low back pain, unspecified: Secondary | ICD-10-CM | POA: Diagnosis not present

## 2020-02-06 DIAGNOSIS — R2681 Unsteadiness on feet: Secondary | ICD-10-CM | POA: Diagnosis not present

## 2020-02-06 DIAGNOSIS — R262 Difficulty in walking, not elsewhere classified: Secondary | ICD-10-CM | POA: Diagnosis not present

## 2020-02-08 DIAGNOSIS — M25551 Pain in right hip: Secondary | ICD-10-CM | POA: Diagnosis not present

## 2020-02-08 DIAGNOSIS — R262 Difficulty in walking, not elsewhere classified: Secondary | ICD-10-CM | POA: Diagnosis not present

## 2020-02-08 DIAGNOSIS — R2681 Unsteadiness on feet: Secondary | ICD-10-CM | POA: Diagnosis not present

## 2020-02-08 DIAGNOSIS — R531 Weakness: Secondary | ICD-10-CM | POA: Diagnosis not present

## 2020-02-08 DIAGNOSIS — M6281 Muscle weakness (generalized): Secondary | ICD-10-CM | POA: Diagnosis not present

## 2020-02-08 DIAGNOSIS — M545 Low back pain, unspecified: Secondary | ICD-10-CM | POA: Diagnosis not present

## 2020-02-11 DIAGNOSIS — R0789 Other chest pain: Secondary | ICD-10-CM | POA: Diagnosis not present

## 2020-02-11 DIAGNOSIS — J918 Pleural effusion in other conditions classified elsewhere: Secondary | ICD-10-CM | POA: Diagnosis not present

## 2020-02-11 DIAGNOSIS — I2609 Other pulmonary embolism with acute cor pulmonale: Secondary | ICD-10-CM | POA: Diagnosis not present

## 2020-02-11 DIAGNOSIS — J9690 Respiratory failure, unspecified, unspecified whether with hypoxia or hypercapnia: Secondary | ICD-10-CM | POA: Diagnosis not present

## 2020-02-12 DIAGNOSIS — R2681 Unsteadiness on feet: Secondary | ICD-10-CM | POA: Diagnosis not present

## 2020-02-12 DIAGNOSIS — M25551 Pain in right hip: Secondary | ICD-10-CM | POA: Diagnosis not present

## 2020-02-12 DIAGNOSIS — M545 Low back pain, unspecified: Secondary | ICD-10-CM | POA: Diagnosis not present

## 2020-02-13 DIAGNOSIS — M1990 Unspecified osteoarthritis, unspecified site: Secondary | ICD-10-CM | POA: Insufficient documentation

## 2020-02-13 DIAGNOSIS — E041 Nontoxic single thyroid nodule: Secondary | ICD-10-CM | POA: Insufficient documentation

## 2020-02-13 DIAGNOSIS — K5790 Diverticulosis of intestine, part unspecified, without perforation or abscess without bleeding: Secondary | ICD-10-CM | POA: Insufficient documentation

## 2020-02-13 DIAGNOSIS — D649 Anemia, unspecified: Secondary | ICD-10-CM | POA: Insufficient documentation

## 2020-02-13 DIAGNOSIS — R112 Nausea with vomiting, unspecified: Secondary | ICD-10-CM | POA: Insufficient documentation

## 2020-02-13 DIAGNOSIS — R0600 Dyspnea, unspecified: Secondary | ICD-10-CM | POA: Insufficient documentation

## 2020-02-14 DIAGNOSIS — M25551 Pain in right hip: Secondary | ICD-10-CM | POA: Diagnosis not present

## 2020-02-14 DIAGNOSIS — R2681 Unsteadiness on feet: Secondary | ICD-10-CM | POA: Diagnosis not present

## 2020-02-14 DIAGNOSIS — M6281 Muscle weakness (generalized): Secondary | ICD-10-CM | POA: Diagnosis not present

## 2020-02-14 DIAGNOSIS — M545 Low back pain, unspecified: Secondary | ICD-10-CM | POA: Diagnosis not present

## 2020-02-15 ENCOUNTER — Ambulatory Visit: Payer: Medicare PPO | Admitting: Cardiology

## 2020-02-15 ENCOUNTER — Other Ambulatory Visit: Payer: Self-pay

## 2020-02-15 ENCOUNTER — Encounter: Payer: Self-pay | Admitting: Cardiology

## 2020-02-15 VITALS — BP 140/82 | HR 87 | Ht 61.0 in | Wt 172.1 lb

## 2020-02-15 DIAGNOSIS — Z86711 Personal history of pulmonary embolism: Secondary | ICD-10-CM

## 2020-02-15 DIAGNOSIS — I10 Essential (primary) hypertension: Secondary | ICD-10-CM | POA: Diagnosis not present

## 2020-02-15 DIAGNOSIS — R0602 Shortness of breath: Secondary | ICD-10-CM

## 2020-02-15 DIAGNOSIS — R0600 Dyspnea, unspecified: Secondary | ICD-10-CM

## 2020-02-15 DIAGNOSIS — R0609 Other forms of dyspnea: Secondary | ICD-10-CM
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
## 2020-02-15 NOTE — Patient Instructions (Signed)
Medication Instructions:  Your physician recommends that you continue on your current medications as directed. Please refer to the Current Medication list given to you today.  *If you need a refill on your cardiac medications before your next appointment, please call your pharmacy*   Lab Work: None If you have labs (blood work) drawn today and your tests are completely normal, you will receive your results only by: Marland Kitchen MyChart Message (if you have MyChart) OR . A paper copy in the mail If you have any lab test that is abnormal or we need to change your treatment, we will call you to review the results.   Testing/Procedures: Your physician has requested that you have an echocardiogram. Echocardiography is a painless test that uses sound waves to create images of your heart. It provides your doctor with information about the size and shape of your heart and how well your heart's chambers and valves are working. This procedure takes approximately one hour. There are no restrictions for this procedure.     Follow-Up: At [facility], you and your health needs are our priority.  As part of our continuing mission to provide you with exceptional heart care, we have created designated Provider Care Teams.  These Care Teams include your primary Cardiologist (physician) and Advanced Practice Providers (APPs -  Physician Assistants and Nurse Practitioners) who all work together to provide you with the care you need, when you need it.  We recommend signing up for the patient portal called "MyChart".  Sign up information is provided on this After Visit Summary.  MyChart is used to connect with patients for Virtual Visits (Telemedicine).  Patients are able to view lab/test results, encounter notes, upcoming appointments, etc.  Non-urgent messages can be sent to your provider as well.   To learn more about what you can do with MyChart, go to [email or web address].    Your next appointment:   6  week(s)  The format for your next appointment:   In Person  Provider:   Jenne Campus, MD   Other Instructions

## 2020-02-19 DIAGNOSIS — R2681 Unsteadiness on feet: Secondary | ICD-10-CM | POA: Diagnosis not present

## 2020-02-19 DIAGNOSIS — M25551 Pain in right hip: Secondary | ICD-10-CM | POA: Diagnosis not present

## 2020-02-19 DIAGNOSIS — M545 Low back pain, unspecified: Secondary | ICD-10-CM | POA: Diagnosis not present

## 2020-02-19 DIAGNOSIS — M6281 Muscle weakness (generalized): Secondary | ICD-10-CM | POA: Diagnosis not present

## 2020-02-21 DIAGNOSIS — M545 Low back pain, unspecified: Secondary | ICD-10-CM | POA: Diagnosis not present

## 2020-02-21 DIAGNOSIS — M25551 Pain in right hip: Secondary | ICD-10-CM | POA: Diagnosis not present

## 2020-02-21 DIAGNOSIS — M6281 Muscle weakness (generalized): Secondary | ICD-10-CM | POA: Diagnosis not present

## 2020-02-21 DIAGNOSIS — R2681 Unsteadiness on feet: Secondary | ICD-10-CM | POA: Diagnosis not present

## 2020-02-22 ENCOUNTER — Ambulatory Visit: Payer: Medicare PPO | Admitting: Pulmonary Disease

## 2020-02-22 ENCOUNTER — Encounter: Payer: Self-pay | Admitting: Pulmonary Disease

## 2020-02-22 ENCOUNTER — Other Ambulatory Visit: Payer: Self-pay

## 2020-02-22 VITALS — BP 120/62 | HR 90 | Temp 98.0°F | Ht 62.0 in | Wt 172.0 lb

## 2020-02-22 DIAGNOSIS — K449 Diaphragmatic hernia without obstruction or gangrene: Secondary | ICD-10-CM | POA: Diagnosis not present

## 2020-02-22 DIAGNOSIS — R0602 Shortness of breath: Secondary | ICD-10-CM

## 2020-02-22 DIAGNOSIS — Z86711 Personal history of pulmonary embolism: Secondary | ICD-10-CM

## 2020-02-22 NOTE — Patient Instructions (Addendum)
Thank you for visiting Dr. Valeta Harms at [facility]. Today we recommend the following:  Orders Placed This Encounter  Procedures  . Ambulatory referral to Cardiothoracic Surgery   Follow up with appt regarding ECHO which has been ordered.   Return in about 3 months (around 05/24/2020), or if symptoms worsen or fail to improve.    Please do your part to reduce the spread of COVID-19.
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
## 2020-02-26 DIAGNOSIS — M25551 Pain in right hip: Secondary | ICD-10-CM | POA: Diagnosis not present

## 2020-02-26 DIAGNOSIS — M6281 Muscle weakness (generalized): Secondary | ICD-10-CM | POA: Diagnosis not present

## 2020-02-26 DIAGNOSIS — R2681 Unsteadiness on feet: Secondary | ICD-10-CM | POA: Diagnosis not present

## 2020-02-26 DIAGNOSIS — M545 Low back pain, unspecified: Secondary | ICD-10-CM | POA: Diagnosis not present

## 2020-02-28 ENCOUNTER — Other Ambulatory Visit: Payer: Self-pay

## 2020-02-28 ENCOUNTER — Encounter: Payer: Self-pay | Admitting: Thoracic Surgery (Cardiothoracic Vascular Surgery)

## 2020-02-28 ENCOUNTER — Institutional Professional Consult (permissible substitution): Payer: Medicare PPO | Admitting: Thoracic Surgery (Cardiothoracic Vascular Surgery)

## 2020-02-28 VITALS — BP 130/79 | HR 98 | Temp 97.7°F | Resp 20 | Ht 61.0 in | Wt 172.0 lb

## 2020-02-28 DIAGNOSIS — K449 Diaphragmatic hernia without obstruction or gangrene: Secondary | ICD-10-CM | POA: Diagnosis not present

## 2020-02-28 DIAGNOSIS — M25572 Pain in left ankle and joints of left foot: Secondary | ICD-10-CM | POA: Diagnosis not present

## 2020-02-28 DIAGNOSIS — M2142 Flat foot [pes planus] (acquired), left foot: Secondary | ICD-10-CM | POA: Diagnosis not present

## 2020-02-28 DIAGNOSIS — M19072 Primary osteoarthritis, left ankle and foot: Secondary | ICD-10-CM | POA: Diagnosis not present
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
## 2020-02-29 DIAGNOSIS — M545 Low back pain, unspecified: Secondary | ICD-10-CM | POA: Diagnosis not present

## 2020-02-29 DIAGNOSIS — M25551 Pain in right hip: Secondary | ICD-10-CM | POA: Diagnosis not present

## 2020-02-29 DIAGNOSIS — R2681 Unsteadiness on feet: Secondary | ICD-10-CM | POA: Diagnosis not present

## 2020-02-29 DIAGNOSIS — M6281 Muscle weakness (generalized): Secondary | ICD-10-CM | POA: Diagnosis not present

## 2020-03-03 ENCOUNTER — Other Ambulatory Visit: Payer: Self-pay | Admitting: *Deleted

## 2020-03-03 ENCOUNTER — Encounter: Payer: Self-pay | Admitting: *Deleted

## 2020-03-03 ENCOUNTER — Other Ambulatory Visit: Payer: Self-pay | Admitting: Thoracic Surgery (Cardiothoracic Vascular Surgery)

## 2020-03-03 DIAGNOSIS — M25551 Pain in right hip: Secondary | ICD-10-CM | POA: Diagnosis not present

## 2020-03-03 DIAGNOSIS — Z01818 Encounter for other preprocedural examination: Secondary | ICD-10-CM

## 2020-03-03 DIAGNOSIS — K449 Diaphragmatic hernia without obstruction or gangrene: Secondary | ICD-10-CM

## 2020-03-03 DIAGNOSIS — R2681 Unsteadiness on feet: Secondary | ICD-10-CM | POA: Diagnosis not present

## 2020-03-03 DIAGNOSIS — M545 Low back pain, unspecified: Secondary | ICD-10-CM | POA: Diagnosis not present

## 2020-03-03 DIAGNOSIS — M6281 Muscle weakness (generalized): Secondary | ICD-10-CM | POA: Diagnosis not present

## 2020-03-03 NOTE — Progress Notes (Unsigned)
CT

## 2020-03-05 ENCOUNTER — Other Ambulatory Visit: Payer: Self-pay

## 2020-03-05 ENCOUNTER — Ambulatory Visit (HOSPITAL_BASED_OUTPATIENT_CLINIC_OR_DEPARTMENT_OTHER)
Admission: RE | Admit: 2020-03-05 | Discharge: 2020-03-05 | Disposition: A | Discharge status: Home / Self Care | Payer: Medicare PPO | Source: Ambulatory Visit | Attending: Cardiology | Admitting: Cardiology

## 2020-03-05 DIAGNOSIS — R0602 Shortness of breath: Secondary | ICD-10-CM | POA: Insufficient documentation

## 2020-03-05 LAB — ECHOCARDIOGRAM COMPLETE
Area-P 1/2: 3.83 cm2
S' Lateral: 2.64 cm

## 2020-03-06 DIAGNOSIS — M545 Low back pain, unspecified: Secondary | ICD-10-CM | POA: Diagnosis not present

## 2020-03-06 DIAGNOSIS — M25551 Pain in right hip: Secondary | ICD-10-CM | POA: Diagnosis not present

## 2020-03-06 DIAGNOSIS — M6281 Muscle weakness (generalized): Secondary | ICD-10-CM | POA: Diagnosis not present

## 2020-03-06 DIAGNOSIS — R2681 Unsteadiness on feet: Secondary | ICD-10-CM | POA: Diagnosis not present

## 2020-03-09 DIAGNOSIS — M25551 Pain in right hip: Secondary | ICD-10-CM | POA: Diagnosis not present

## 2020-03-09 DIAGNOSIS — M545 Low back pain, unspecified: Secondary | ICD-10-CM | POA: Diagnosis not present

## 2020-03-09 DIAGNOSIS — M6281 Muscle weakness (generalized): Secondary | ICD-10-CM | POA: Diagnosis not present

## 2020-03-09 DIAGNOSIS — R2681 Unsteadiness on feet: Secondary | ICD-10-CM | POA: Diagnosis not present

## 2020-03-11 DIAGNOSIS — M6281 Muscle weakness (generalized): Secondary | ICD-10-CM | POA: Diagnosis not present

## 2020-03-11 DIAGNOSIS — R0989 Other specified symptoms and signs involving the circulatory and respiratory systems: Secondary | ICD-10-CM | POA: Diagnosis not present

## 2020-03-11 DIAGNOSIS — M25551 Pain in right hip: Secondary | ICD-10-CM | POA: Diagnosis not present

## 2020-03-11 DIAGNOSIS — M545 Low back pain, unspecified: Secondary | ICD-10-CM | POA: Diagnosis not present

## 2020-03-11 DIAGNOSIS — K449 Diaphragmatic hernia without obstruction or gangrene: Secondary | ICD-10-CM | POA: Diagnosis not present

## 2020-03-11 DIAGNOSIS — M25572 Pain in left ankle and joints of left foot: Secondary | ICD-10-CM | POA: Diagnosis not present

## 2020-03-11 DIAGNOSIS — R2681 Unsteadiness on feet: Secondary | ICD-10-CM | POA: Diagnosis not present

## 2020-03-11 DIAGNOSIS — M810 Age-related osteoporosis without current pathological fracture: Secondary | ICD-10-CM | POA: Diagnosis not present

## 2020-03-12 ENCOUNTER — Other Ambulatory Visit (HOSPITAL_COMMUNITY): Payer: Self-pay | Admitting: Family Medicine

## 2020-03-12 ENCOUNTER — Other Ambulatory Visit (HOSPITAL_BASED_OUTPATIENT_CLINIC_OR_DEPARTMENT_OTHER): Payer: Self-pay | Admitting: Family Medicine

## 2020-03-12 DIAGNOSIS — R0989 Other specified symptoms and signs involving the circulatory and respiratory systems: Secondary | ICD-10-CM

## 2020-03-13 DIAGNOSIS — J9690 Respiratory failure, unspecified, unspecified whether with hypoxia or hypercapnia: Secondary | ICD-10-CM | POA: Diagnosis not present

## 2020-03-13 DIAGNOSIS — I2609 Other pulmonary embolism with acute cor pulmonale: Secondary | ICD-10-CM | POA: Diagnosis not present

## 2020-03-13 DIAGNOSIS — J918 Pleural effusion in other conditions classified elsewhere: Secondary | ICD-10-CM | POA: Diagnosis not present

## 2020-03-13 DIAGNOSIS — R0789 Other chest pain: Secondary | ICD-10-CM | POA: Diagnosis not present

## 2020-03-18 DIAGNOSIS — M25551 Pain in right hip: Secondary | ICD-10-CM | POA: Diagnosis not present

## 2020-03-18 DIAGNOSIS — R2681 Unsteadiness on feet: Secondary | ICD-10-CM | POA: Diagnosis not present

## 2020-03-18 DIAGNOSIS — M545 Low back pain, unspecified: Secondary | ICD-10-CM | POA: Diagnosis not present

## 2020-03-18 DIAGNOSIS — M6281 Muscle weakness (generalized): Secondary | ICD-10-CM | POA: Diagnosis not present

## 2020-03-20 DIAGNOSIS — M6281 Muscle weakness (generalized): Secondary | ICD-10-CM | POA: Diagnosis not present

## 2020-03-20 DIAGNOSIS — M545 Low back pain, unspecified: Secondary | ICD-10-CM | POA: Diagnosis not present

## 2020-03-20 DIAGNOSIS — M25551 Pain in right hip: Secondary | ICD-10-CM | POA: Diagnosis not present

## 2020-03-20 DIAGNOSIS — R2681 Unsteadiness on feet: Secondary | ICD-10-CM | POA: Diagnosis not present

## 2020-03-21 ENCOUNTER — Other Ambulatory Visit: Payer: Self-pay

## 2020-03-21 ENCOUNTER — Ambulatory Visit (HOSPITAL_COMMUNITY)
Admission: RE | Admit: 2020-03-21 | Discharge: 2020-03-21 | Disposition: A | Discharge status: Home / Self Care | Payer: Medicare PPO | Source: Ambulatory Visit | Attending: Family Medicine | Admitting: Family Medicine

## 2020-03-21 DIAGNOSIS — R0989 Other specified symptoms and signs involving the circulatory and respiratory systems: Secondary | ICD-10-CM | POA: Diagnosis not present

## 2020-03-21 NOTE — Progress Notes (Signed)
ABI has been completed.   Preliminary results in CV Proc.   Abram Sander 03/21/2020 11:28 AM

## 2020-03-25 DIAGNOSIS — Z6835 Body mass index (BMI) 35.0-35.9, adult: Secondary | ICD-10-CM | POA: Diagnosis not present

## 2020-03-25 DIAGNOSIS — M25551 Pain in right hip: Secondary | ICD-10-CM | POA: Diagnosis not present

## 2020-03-25 DIAGNOSIS — I10 Essential (primary) hypertension: Secondary | ICD-10-CM | POA: Diagnosis not present

## 2020-03-25 DIAGNOSIS — R2681 Unsteadiness on feet: Secondary | ICD-10-CM | POA: Diagnosis not present

## 2020-03-25 DIAGNOSIS — M810 Age-related osteoporosis without current pathological fracture: Secondary | ICD-10-CM | POA: Diagnosis not present

## 2020-03-25 DIAGNOSIS — M6281 Muscle weakness (generalized): Secondary | ICD-10-CM | POA: Diagnosis not present

## 2020-03-25 DIAGNOSIS — E041 Nontoxic single thyroid nodule: Secondary | ICD-10-CM | POA: Diagnosis not present

## 2020-03-25 DIAGNOSIS — M545 Low back pain, unspecified: Secondary | ICD-10-CM | POA: Diagnosis not present

## 2020-03-27 DIAGNOSIS — M545 Low back pain, unspecified: Secondary | ICD-10-CM | POA: Diagnosis not present

## 2020-03-27 DIAGNOSIS — M25551 Pain in right hip: Secondary | ICD-10-CM | POA: Diagnosis not present

## 2020-03-27 DIAGNOSIS — M6281 Muscle weakness (generalized): Secondary | ICD-10-CM | POA: Diagnosis not present

## 2020-03-27 DIAGNOSIS — R2681 Unsteadiness on feet: Secondary | ICD-10-CM | POA: Diagnosis not present

## 2020-04-01 DIAGNOSIS — M6281 Muscle weakness (generalized): Secondary | ICD-10-CM | POA: Diagnosis not present

## 2020-04-01 DIAGNOSIS — M25551 Pain in right hip: Secondary | ICD-10-CM | POA: Diagnosis not present

## 2020-04-01 DIAGNOSIS — R2681 Unsteadiness on feet: Secondary | ICD-10-CM | POA: Diagnosis not present

## 2020-04-01 DIAGNOSIS — M545 Low back pain, unspecified: Secondary | ICD-10-CM | POA: Diagnosis not present

## 2020-04-02 ENCOUNTER — Other Ambulatory Visit: Payer: Self-pay

## 2020-04-02 ENCOUNTER — Encounter: Payer: Self-pay | Admitting: Cardiology

## 2020-04-02 ENCOUNTER — Ambulatory Visit: Payer: Medicare PPO | Admitting: Cardiology

## 2020-04-02 VITALS — BP 152/80 | HR 86 | Ht 61.0 in | Wt 187.0 lb

## 2020-04-02 DIAGNOSIS — R0609 Other forms of dyspnea: Secondary | ICD-10-CM

## 2020-04-02 DIAGNOSIS — I10 Essential (primary) hypertension: Secondary | ICD-10-CM | POA: Diagnosis not present

## 2020-04-02 DIAGNOSIS — R0600 Dyspnea, unspecified: Secondary | ICD-10-CM | POA: Diagnosis not present

## 2020-04-02 DIAGNOSIS — I5032 Chronic diastolic (congestive) heart failure: Secondary | ICD-10-CM | POA: Diagnosis not present

## 2020-04-02 DIAGNOSIS — Z01810 Encounter for preprocedural cardiovascular examination: Secondary | ICD-10-CM | POA: Diagnosis not present

## 2020-04-02 DIAGNOSIS — Z01818 Encounter for other preprocedural examination: Secondary | ICD-10-CM | POA: Diagnosis not present

## 2020-04-02 HISTORY — DX: Encounter for preprocedural cardiovascular examination: Z01.810

## 2020-04-02 NOTE — Patient Instructions (Signed)
Medication Instructions:  Your physician recommends that you continue on your current medications as directed. Please refer to the Current Medication list given to you today.  *If you need a refill on your cardiac medications before your next appointment, please call your pharmacy*   Lab Work: None. If you have labs (blood work) drawn today and your tests are completely normal, you will receive your results only by: . MyChart Message (if you have MyChart) OR . A paper copy in the mail If you have any lab test that is abnormal or we need to change your treatment, we will call you to review the results.   Testing/Procedures:   [facility] at [address], [address] , [place] [address] Phone: [phone]    Please arrive 15 minutes prior to your appointment time for registration and insurance purposes.  The test will take approximately 3 to 4 hours to complete; you may bring reading material.  If someone comes with you to your appointment, they will need to remain in the main lobby due to limited space in the testing area. **If you are pregnant or breastfeeding, please notify the nuclear lab prior to your appointment**  How to prepare for your Myocardial Perfusion Test: . Do not eat or drink 3 hours prior to your test, except you may have water. . Do not consume products containing caffeine (regular or decaffeinated) 12 hours prior to your test. (ex: coffee, chocolate, sodas, tea). . Do bring a list of your current medications with you.  If not listed below, you may take your medications as normal. . Do wear comfortable clothes (no dresses or overalls) and walking shoes, tennis shoes preferred (No heels or open toe shoes are allowed). . Do NOT wear cologne, perfume, aftershave, or lotions (deodorant is allowed). . If these instructions are not followed, your test will have to be rescheduled.  Please report to [address],  [address] for your test.  If you have questions or concerns about your appointment, you can call the Nuclear Lab at [phone].  If you cannot keep your appointment, please provide 24 hours notification to the Nuclear Lab, to avoid a possible $50 charge to your account.    Follow-Up: At [facility], you and your health needs are our priority.  As part of our continuing mission to provide you with exceptional heart care, we have created designated Provider Care Teams.  These Care Teams include your primary Cardiologist (physician) and Advanced Practice Providers (APPs -  Physician Assistants and Nurse Practitioners) who all work together to provide you with the care you need, when you need it.  We recommend signing up for the patient portal called "MyChart".  Sign up information is provided on this After Visit Summary.  MyChart is used to connect with patients for Virtual Visits (Telemedicine).  Patients are able to view lab/test results, encounter notes, upcoming appointments, etc.  Non-urgent messages can be sent to your provider as well.   To learn more about what you can do with MyChart, go to [email or web address].com.    Your next appointment:   5 month(s)  The format for your next appointment:   In Person  Provider:   Robert Krasowski, MD   Other Instructions   Cardiac Nuclear Scan A cardiac nuclear scan is a test that measures blood flow to the heart when a person is resting and when he or she is exercising. The test looks for problems such as:    Not enough blood reaching a portion of the heart.  The heart muscle not working normally. You may need this test if:  You have heart disease.  You have had abnormal lab results.  You have had heart surgery or a balloon procedure to open up blocked arteries (angioplasty).  You have chest pain.  You have shortness of breath. In this test, a radioactive dye (tracer) is injected into your bloodstream. After the tracer has  traveled to your heart, an imaging device is used to measure how much of the tracer is absorbed by or distributed to various areas of your heart. This procedure is usually done at a hospital and takes 2-4 hours. Tell a health care provider about:  Any allergies you have.  All medicines you are taking, including vitamins, herbs, eye drops, creams, and over-the-counter medicines.  Any problems you or family members have had with anesthetic medicines.  Any blood disorders you have.  Any surgeries you have had.  Any medical conditions you have.  Whether you are pregnant or may be pregnant. What are the risks? Generally, this is a safe procedure. However, problems may occur, including:  Serious chest pain and heart attack. This is only a risk if the stress portion of the test is done.  Rapid heartbeat.  Sensation of warmth in your chest. This usually passes quickly.  Allergic reaction to the tracer. What happens before the procedure?  Ask your health care provider about changing or stopping your regular medicines. This is especially important if you are taking diabetes medicines or blood thinners.  Follow instructions from your health care provider about eating or drinking restrictions.  Remove your jewelry on the day of the procedure. What happens during the procedure?  An IV will be inserted into one of your veins.  Your health care provider will inject a small amount of radioactive tracer through the IV.  You will wait for 20-40 minutes while the tracer travels through your bloodstream.  Your heart activity will be monitored with an electrocardiogram (ECG).  You will lie down on an exam table.  Images of your heart will be taken for about 15-20 minutes.  You may also have a stress test. For this test, one of the following may be done: ? You will exercise on a treadmill or stationary bike. While you exercise, your heart's activity will be monitored with an ECG, and your  blood pressure will be checked. ? You will be given medicines that will increase blood flow to parts of your heart. This is done if you are unable to exercise.  When blood flow to your heart has peaked, a tracer will again be injected through the IV.  After 20-40 minutes, you will get back on the exam table and have more images taken of your heart.  Depending on the type of tracer used, scans may need to be repeated 3-4 hours later.  Your IV line will be removed when the procedure is over. The procedure may vary among health care providers and hospitals. What happens after the procedure?  Unless your health care provider tells you otherwise, you may return to your normal schedule, including diet, activities, and medicines.  Unless your health care provider tells you otherwise, you may increase your fluid intake. This will help to flush the contrast dye from your body. Drink enough fluid to keep your urine pale yellow.  Ask your health care provider, or the department that is doing the test: ? When will my results   be ready? ? How will I get my results? Summary  A cardiac nuclear scan measures the blood flow to the heart when a person is resting and when he or she is exercising.  Tell your health care provider if you are pregnant.  Before the procedure, ask your health care provider about changing or stopping your regular medicines. This is especially important if you are taking diabetes medicines or blood thinners.  After the procedure, unless your health care provider tells you otherwise, increase your fluid intake. This will help flush the contrast dye from your body.  After the procedure, unless your health care provider tells you otherwise, you may return to your normal schedule, including diet, activities, and medicines. This information is not intended to replace advice given to you by your health care provider. Make sure you discuss any questions you have with your health care  provider. Document Revised: 09/19/2017 Document Reviewed: 09/19/2017 Elsevier Patient Education  2020 Elsevier Inc.   
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
## 2020-04-03 DIAGNOSIS — M545 Low back pain, unspecified: Secondary | ICD-10-CM | POA: Diagnosis not present

## 2020-04-03 DIAGNOSIS — M6281 Muscle weakness (generalized): Secondary | ICD-10-CM | POA: Diagnosis not present

## 2020-04-03 DIAGNOSIS — R2681 Unsteadiness on feet: Secondary | ICD-10-CM | POA: Diagnosis not present

## 2020-04-03 DIAGNOSIS — M25551 Pain in right hip: Secondary | ICD-10-CM | POA: Diagnosis not present

## 2020-04-04 ENCOUNTER — Encounter (HOSPITAL_BASED_OUTPATIENT_CLINIC_OR_DEPARTMENT_OTHER): Payer: Medicare PPO

## 2020-04-08 DIAGNOSIS — R2681 Unsteadiness on feet: Secondary | ICD-10-CM | POA: Diagnosis not present

## 2020-04-08 DIAGNOSIS — M25551 Pain in right hip: Secondary | ICD-10-CM | POA: Diagnosis not present

## 2020-04-08 DIAGNOSIS — M6281 Muscle weakness (generalized): Secondary | ICD-10-CM | POA: Diagnosis not present

## 2020-04-08 DIAGNOSIS — M545 Low back pain, unspecified: Secondary | ICD-10-CM | POA: Diagnosis not present

## 2020-04-12 DIAGNOSIS — J9690 Respiratory failure, unspecified, unspecified whether with hypoxia or hypercapnia: Secondary | ICD-10-CM | POA: Diagnosis not present

## 2020-04-12 DIAGNOSIS — I2609 Other pulmonary embolism with acute cor pulmonale: Secondary | ICD-10-CM | POA: Diagnosis not present

## 2020-04-12 DIAGNOSIS — R0789 Other chest pain: Secondary | ICD-10-CM | POA: Diagnosis not present

## 2020-04-12 DIAGNOSIS — J918 Pleural effusion in other conditions classified elsewhere: Secondary | ICD-10-CM | POA: Diagnosis not present

## 2020-04-15 DIAGNOSIS — M6281 Muscle weakness (generalized): Secondary | ICD-10-CM | POA: Diagnosis not present

## 2020-04-15 DIAGNOSIS — M545 Low back pain, unspecified: Secondary | ICD-10-CM | POA: Diagnosis not present

## 2020-04-15 DIAGNOSIS — R2681 Unsteadiness on feet: Secondary | ICD-10-CM | POA: Diagnosis not present

## 2020-04-15 DIAGNOSIS — M25551 Pain in right hip: Secondary | ICD-10-CM | POA: Diagnosis not present

## 2020-04-16 ENCOUNTER — Telehealth (HOSPITAL_COMMUNITY): Payer: Self-pay | Admitting: *Deleted

## 2020-04-16 NOTE — Telephone Encounter (Signed)
Patient given detailed instructions per Myocardial Perfusion Study Information Sheet for the test on 04/21/20 at 10:15. Patient notified to arrive 15 minutes early and that it is imperative to arrive on time for appointment to keep from having the test rescheduled.  If you need to cancel or reschedule your appointment, please call the office within 24 hours of your appointment. . Patient verbalized understanding.Daneil Dolin

## 2020-04-21 ENCOUNTER — Encounter (HOSPITAL_COMMUNITY): Payer: Medicare PPO

## 2020-04-22 ENCOUNTER — Other Ambulatory Visit: Payer: Self-pay

## 2020-04-22 ENCOUNTER — Encounter (HOSPITAL_COMMUNITY): Payer: Medicare PPO

## 2020-04-22 ENCOUNTER — Ambulatory Visit (HOSPITAL_COMMUNITY): Payer: Medicare PPO | Attending: Cardiology

## 2020-04-22 VITALS — Ht 61.0 in | Wt 187.0 lb

## 2020-04-22 DIAGNOSIS — Z01818 Encounter for other preprocedural examination: Secondary | ICD-10-CM | POA: Insufficient documentation

## 2020-04-22 DIAGNOSIS — Z01810 Encounter for preprocedural cardiovascular examination: Secondary | ICD-10-CM | POA: Diagnosis not present

## 2020-04-22 DIAGNOSIS — R0609 Other forms of dyspnea: Secondary | ICD-10-CM

## 2020-04-22 DIAGNOSIS — R0600 Dyspnea, unspecified: Secondary | ICD-10-CM | POA: Insufficient documentation

## 2020-04-22 LAB — MYOCARDIAL PERFUSION IMAGING
LV dias vol: 34 mL (ref 46–106)
LV sys vol: 8 mL
Peak HR: 92 {beats}/min
Rest HR: 72 {beats}/min
SDS: 1
SRS: 0
SSS: 1
TID: 0.76

## 2020-04-22 MED ORDER — TECHNETIUM TC 99M TETROFOSMIN IV KIT
10.1000 | PACK | Freq: Once | INTRAVENOUS | Status: AC | PRN
  Administered 2020-04-22: 10.1 via INTRAVENOUS
  Filled 2020-04-22: qty 11

## 2020-04-22 MED ORDER — TECHNETIUM TC 99M TETROFOSMIN IV KIT
32.6000 | PACK | Freq: Once | INTRAVENOUS | Status: AC | PRN
  Administered 2020-04-22: 32.6 via INTRAVENOUS
  Filled 2020-04-22: qty 33

## 2020-04-22 MED ORDER — REGADENOSON 0.4 MG/5ML IV SOLN
0.4000 mg | Freq: Once | INTRAVENOUS | Status: AC
  Administered 2020-04-22: 0.4 mg via INTRAVENOUS

## 2020-04-23 DIAGNOSIS — M25551 Pain in right hip: Secondary | ICD-10-CM | POA: Diagnosis not present

## 2020-04-23 DIAGNOSIS — M6281 Muscle weakness (generalized): Secondary | ICD-10-CM | POA: Diagnosis not present

## 2020-04-23 DIAGNOSIS — M545 Low back pain, unspecified: Secondary | ICD-10-CM | POA: Diagnosis not present

## 2020-04-23 DIAGNOSIS — R2681 Unsteadiness on feet: Secondary | ICD-10-CM | POA: Diagnosis not present

## 2020-04-23 NOTE — Progress Notes (Signed)
[id number] - [place], [place] - [address] [place] [address] Phone: [phone] Fax: [phone]      Your procedure is scheduled on January 10  Report to [facility] Main Entrance "A" at 0530 A.M., and check in at the Admitting office.  Call this number if you have problems the morning of surgery:  [phone]  Call [phone] if you have any questions prior to your surgery date Monday-Friday 8am-4pm    Remember:  Do not eat or drink after midnight the night before your surgery     Take these medicines the morning of surgery with A SIP OF WATER  Eye drops if needed omeprazole (PRILOSEC) trimethoprim (TRIMPEX)  Follow your surgeon's instructions on when to stop Aspirin.  If no instructions were given by your surgeon then you will need to call the office to get those instructions.    As of today, STOP taking any Aleve, Naproxen, Ibuprofen, Motrin, Advil, Goody's, BC's, all herbal medications, fish oil, and all vitamins.                      Do not wear jewelry, make up, or nail polish            Do not wear lotions, powders, perfumes, or deodorant.            Do not shave 48 hours prior to surgery.              Do not bring valuables to the hospital.            [facility] is not responsible for any belongings or valuables.  Do NOT Smoke (Tobacco/Vaping) or drink Alcohol 24 hours prior to your procedure If you use a CPAP at night, you may bring all equipment for your overnight stay.   Contacts, glasses, dentures or bridgework may not be worn into surgery.      For patients admitted to the hospital, discharge time will be determined by your treatment team.   Patients discharged the day of surgery will not be allowed to drive home, and someone needs to stay with them for 24 hours.    Special instructions:   [place]- Preparing For Surgery  Before surgery, you can play an important role. Because skin  is not sterile, your skin needs to be as free of germs as possible. You can reduce the number of germs on your skin by washing with CHG (chlorahexidine gluconate) Soap before surgery.  CHG is an antiseptic cleaner which kills germs and bonds with the skin to continue killing germs even after washing.    Oral Hygiene is also important to reduce your risk of infection.  Remember - BRUSH YOUR TEETH THE MORNING OF SURGERY WITH YOUR REGULAR TOOTHPASTE  Please do not use if you have an allergy to CHG or antibacterial soaps. If your skin becomes reddened/irritated stop using the CHG.  Do not shave (including legs and underarms) for at least 48 hours prior to first CHG shower. It is OK to shave your face.  Please follow these instructions carefully.   1. Shower the NIGHT BEFORE SURGERY and the MORNING OF SURGERY with CHG Soap.   2. If you chose to wash your hair, wash your hair first as usual with your normal shampoo.  3. After you shampoo, rinse your hair and body thoroughly to remove the shampoo.  4. Use CHG as you would any  other liquid soap. You can apply CHG directly to the skin and wash gently with a scrungie or a clean washcloth.   5. Apply the CHG Soap to your body ONLY FROM THE NECK DOWN.  Do not use on open wounds or open sores. Avoid contact with your eyes, ears, mouth and genitals (private parts). Wash Face and genitals (private parts)  with your normal soap.   6. Wash thoroughly, paying special attention to the area where your surgery will be performed.  7. Thoroughly rinse your body with warm water from the neck down.  8. DO NOT shower/wash with your normal soap after using and rinsing off the CHG Soap.  9. Pat yourself dry with a CLEAN TOWEL.  10. Wear CLEAN PAJAMAS to bed the night before surgery  11. Place CLEAN SHEETS on your bed the night of your first shower and DO NOT SLEEP WITH PETS.   Day of Surgery: Wear Clean/Comfortable clothing the morning of surgery Do not apply  any deodorants/lotions.   Remember to brush your teeth WITH YOUR REGULAR TOOTHPASTE.   Please read over the following fact sheets that you were given.

## 2020-04-24 ENCOUNTER — Encounter (HOSPITAL_COMMUNITY): Payer: Self-pay

## 2020-04-24 ENCOUNTER — Ambulatory Visit
Admission: RE | Admit: 2020-04-24 | Discharge: 2020-04-24 | Disposition: A | Discharge status: Home / Self Care | Payer: Medicare PPO | Source: Ambulatory Visit | Attending: Thoracic Surgery (Cardiothoracic Vascular Surgery) | Admitting: Thoracic Surgery (Cardiothoracic Vascular Surgery)

## 2020-04-24 ENCOUNTER — Other Ambulatory Visit (HOSPITAL_COMMUNITY)
Admission: RE | Admit: 2020-04-24 | Discharge: 2020-04-24 | Disposition: A | Discharge status: Home / Self Care | Payer: Medicare PPO | Source: Ambulatory Visit

## 2020-04-24 ENCOUNTER — Other Ambulatory Visit: Payer: Self-pay

## 2020-04-24 ENCOUNTER — Encounter (HOSPITAL_COMMUNITY)
Admission: RE | Admit: 2020-04-24 | Discharge: 2020-04-24 | Disposition: A | Discharge status: Home / Self Care | Payer: Medicare PPO | Source: Ambulatory Visit | Attending: Thoracic Surgery (Cardiothoracic Vascular Surgery) | Admitting: Thoracic Surgery (Cardiothoracic Vascular Surgery)

## 2020-04-24 DIAGNOSIS — K449 Diaphragmatic hernia without obstruction or gangrene: Secondary | ICD-10-CM | POA: Diagnosis not present

## 2020-04-24 DIAGNOSIS — I700 Atherosclerosis of aorta: Secondary | ICD-10-CM | POA: Diagnosis not present

## 2020-04-24 DIAGNOSIS — Z01818 Encounter for other preprocedural examination: Secondary | ICD-10-CM | POA: Insufficient documentation

## 2020-04-24 DIAGNOSIS — J9811 Atelectasis: Secondary | ICD-10-CM | POA: Diagnosis not present

## 2020-04-24 DIAGNOSIS — Z01812 Encounter for preprocedural laboratory examination: Secondary | ICD-10-CM | POA: Insufficient documentation

## 2020-04-24 DIAGNOSIS — K562 Volvulus: Secondary | ICD-10-CM | POA: Diagnosis not present

## 2020-04-24 DIAGNOSIS — Z20822 Contact with and (suspected) exposure to covid-19: Secondary | ICD-10-CM | POA: Diagnosis not present

## 2020-04-24 HISTORY — DX: Urinary tract infection, site not specified: N39.0

## 2020-04-24 LAB — COMPREHENSIVE METABOLIC PANEL
ALT: 14 U/L (ref 0–44)
AST: 21 U/L (ref 15–41)
Albumin: 3.6 g/dL (ref 3.5–5.0)
Alkaline Phosphatase: 59 U/L (ref 38–126)
Anion gap: 10 (ref 5–15)
BUN: 17 mg/dL (ref 8–23)
CO2: 22 mmol/L (ref 22–32)
Calcium: 8.8 mg/dL — ABNORMAL LOW (ref 8.9–10.3)
Chloride: 104 mmol/L (ref 98–111)
Creatinine, Ser: 0.95 mg/dL (ref 0.44–1.00)
GFR, Estimated: 59 mL/min — ABNORMAL LOW (ref >60)
Glucose, Bld: 110 mg/dL — ABNORMAL HIGH (ref 70–99)
Potassium: 4.3 mmol/L (ref 3.5–5.1)
Sodium: 136 mmol/L (ref 135–145)
Total Bilirubin: 0.9 mg/dL (ref 0.3–1.2)
Total Protein: 6.3 g/dL — ABNORMAL LOW (ref 6.5–8.1)

## 2020-04-24 LAB — URINALYSIS, ROUTINE W REFLEX MICROSCOPIC
Bilirubin Urine: NEGATIVE
Glucose, UA: NEGATIVE mg/dL
Ketones, ur: NEGATIVE mg/dL
Nitrite: POSITIVE — AB
Protein, ur: NEGATIVE mg/dL
Specific Gravity, Urine: 1.017 (ref 1.005–1.030)
pH: 5 (ref 5.0–8.0)

## 2020-04-24 LAB — CBC
HCT: 34.9 % — ABNORMAL LOW (ref 36.0–46.0)
Hemoglobin: 11.3 g/dL — ABNORMAL LOW (ref 12.0–15.0)
MCH: 32.7 pg (ref 26.0–34.0)
MCHC: 32.4 g/dL (ref 30.0–36.0)
MCV: 100.9 fL — ABNORMAL HIGH (ref 80.0–100.0)
Platelets: 286 10*3/uL (ref 150–400)
RBC: 3.46 MIL/uL — ABNORMAL LOW (ref 3.87–5.11)
RDW: 14.7 % (ref 11.5–15.5)
WBC: 5.7 10*3/uL (ref 4.0–10.5)
nRBC: 0.7 % — ABNORMAL HIGH (ref 0.0–0.2)

## 2020-04-24 LAB — BLOOD GAS, ARTERIAL
Acid-Base Excess: 0.2 mmol/L (ref 0.0–2.0)
Bicarbonate: 23.8 mmol/L (ref 20.0–28.0)
Drawn by: 602861
FIO2: 21
O2 Saturation: 96.8 %
Patient temperature: 37
pCO2 arterial: 35.2 mmHg (ref 32.0–48.0)
pH, Arterial: 7.445 (ref 7.350–7.450)
pO2, Arterial: 91.2 mmHg (ref 83.0–108.0)

## 2020-04-24 LAB — SURGICAL PCR SCREEN
MRSA, PCR: NEGATIVE
Staphylococcus aureus: NEGATIVE

## 2020-04-24 LAB — PROTIME-INR
INR: 1 (ref 0.8–1.2)
Prothrombin Time: 12.2 seconds (ref 11.4–15.2)

## 2020-04-24 LAB — TYPE AND SCREEN
ABO/RH(D): B NEG
Antibody Screen: NEGATIVE

## 2020-04-24 LAB — APTT: aPTT: 25 seconds (ref 24–36)

## 2020-04-24 LAB — SARS CORONAVIRUS 2 (TAT 6-24 HRS): SARS Coronavirus 2: NEGATIVE

## 2020-04-24 NOTE — Progress Notes (Addendum)
PCP - victoria Rankin Cardiologist - Krasowski   Chest x-ray -  DOS EKG - 04/24/20 Stress Test - 04/22/20 ECHO - 03/05/20 Cardiac Cath -  na  Sleep Study - na  Blood Thinner Instructions: na Aspirin Instructions: continue til dos    COVID TEST- 04/24/20   Anesthesia review: cardiac hx.  Patient denies shortness of breath, fever, cough and chest pain at PAT appointment   All instructions explained to the patient, with a verbal understanding of the material. Patient agrees to go over the instructions while at home for a better understanding. Patient also instructed to self quarantine after being tested for COVID-19. The opportunity to ask questions was provided.

## 2020-04-24 NOTE — Progress Notes (Signed)
Abnormal UA at preadmission appointment.  Dr. Cliffton Asters notified via in box message.  Chart sent to anesthesia for review.

## 2020-04-25 ENCOUNTER — Encounter: Payer: Self-pay | Admitting: Thoracic Surgery (Cardiothoracic Vascular Surgery)

## 2020-04-25 ENCOUNTER — Ambulatory Visit: Payer: Medicare PPO | Admitting: Thoracic Surgery (Cardiothoracic Vascular Surgery)

## 2020-04-25 VITALS — BP 139/71 | HR 88 | Temp 97.0°F | Resp 20 | Ht 62.0 in

## 2020-04-25 DIAGNOSIS — K449 Diaphragmatic hernia without obstruction or gangrene: Secondary | ICD-10-CM | POA: Diagnosis not present

## 2020-04-25 NOTE — Anesthesia Preprocedure Evaluation (Addendum)
Anesthesia Evaluation  Patient identified by MRN, date of birth, ID band Patient awake    Reviewed: Allergy & Precautions, NPO status , Patient's Chart, lab work & pertinent test results  History of Anesthesia Complications (+) PONV and history of anesthetic complications  Airway Mallampati: II  TM Distance: >3 FB Neck ROM: Full    Dental  (+) Teeth Intact, Dental Advisory Given   Pulmonary PE (2013)   Pulmonary exam normal breath sounds clear to auscultation       Cardiovascular hypertension, Pt. on medications (-) angina+CHF  (-) Past MI Normal cardiovascular exam Rhythm:Regular Rate:Normal  Echo 03/05/20:  1. Left ventricular ejection fraction, by estimation, is 60 to 65%. The  left ventricle has normal function. The left ventricle has no regional  wall motion abnormalities. Left ventricular diastolic parameters are  consistent with Grade I diastolic  dysfunction (impaired relaxation).  2. Right ventricular systolic function is normal. The right ventricular  size is normal. There is mildly elevated pulmonary artery systolic  pressure.  3. The mitral valve is normal in structure. No evidence of mitral valve  regurgitation. No evidence of mitral stenosis.  4. The aortic valve is normal in structure. Aortic valve regurgitation is  not visualized. No aortic stenosis is present.  5. The inferior vena cava is normal in size with greater than 50%  respiratory variability, suggesting right atrial pressure of 3 mmHg.    Neuro/Psych negative neurological ROS     GI/Hepatic Neg liver ROS, hiatal hernia, GERD  Medicated,  Endo/Other  Obesity   Renal/GU negative Renal ROS     Musculoskeletal  (+) Arthritis ,   Abdominal   Peds  Hematology  (+) Blood dyscrasia, anemia ,   Anesthesia Other Findings   Reproductive/Obstetrics                          Anesthesia Physical Anesthesia  Plan  ASA: III  Anesthesia Plan: General   Post-op Pain Management:    Induction: Intravenous  PONV Risk Score and Plan: 4 or greater and Ondansetron, Dexamethasone and Treatment may vary due to age or medical condition  Airway Management Planned: Oral ETT and Double Lumen EBT  Additional Equipment:   Intra-op Plan:   Post-operative Plan: Extubation in OR  Informed Consent: I have reviewed the patients History and Physical, chart, labs and discussed the procedure including the risks, benefits and alternatives for the proposed anesthesia with the patient or authorized representative who has indicated his/her understanding and acceptance.     Dental advisory given  Plan Discussed with: CRNA  Anesthesia Plan Comments: (PAT note by Karoline Caldwell, PA-C:  Recently seen by cardiologist Dr. Agustin Cree 04/02/2020 for preoperative stratification. Per note, "medical history significant for essential hypertension, history of PE, she is scheduled to have abdominal surgery under general anesthesia she is here in the office to talk about that.  Denies have any chest pain tightness squeezing pressure burning chest.  Initially sent to [place] because of dyspnea on exertion she was found to have diastolic dysfunctions with preserved left ventricle ejection fraction.  Her ability to exercise limited because of arthritis in different joints.  Use cane for balance however she is able to walk in the store with very slow pace sometimes she has to use electric cart to move around.  Clearly difficult to assess her readiness for surgery from at that point review based on the story." Nuclear stress test was ordered, done 04/22/2020, showed no  evidence of ischemia, low risk study.  Uses supplemental O2 at night.  Preop labs reviewed, mild anemia with hemoglobin 11.3, otherwise unremarkable.  EKG 04/24/2020: Normal sinus rhythm. Rate 78. Possible LAE.RBBB.  CT chest 04/24/2020: IMPRESSION: 1. Unchanged very large  paraesophageal hiatal hernia with chronic organoaxial gastric volvulus. No acute inflammation or obstruction. 2. Aortic Atherosclerosis (ICD10-I70.0).  Nuclear stress 04/22/2020: There was no ST segment deviation noted during stress. Nuclear stress EF: 76%. The left ventricular ejection fraction is hyperdynamic (>65%). The study is normal. This is a low risk study.   TTE 03/05/2020: 1. Left ventricular ejection fraction, by estimation, is 60 to 65%. The  left ventricle has normal function. The left ventricle has no regional  wall motion abnormalities. Left ventricular diastolic parameters are  consistent with Grade I diastolic  dysfunction (impaired relaxation).  2. Right ventricular systolic function is normal. The right ventricular  size is normal. There is mildly elevated pulmonary artery systolic  pressure.  3. The mitral valve is normal in structure. No evidence of mitral valve  regurgitation. No evidence of mitral stenosis.  4. The aortic valve is normal in structure. Aortic valve regurgitation is  not visualized. No aortic stenosis is present.  5. The inferior vena cava is normal in size with greater than 50%  respiratory variability, suggesting right atrial pressure of 3 mmHg.   )       Anesthesia Quick Evaluation

## 2020-04-25 NOTE — Progress Notes (Signed)
Anesthesia Chart Review:  Recently seen by cardiologist Dr. Agustin Cree 04/02/2020 for preoperative stratification. Per note, "medical history significant for essential hypertension, history of PE, she is scheduled to have abdominal surgery under general anesthesia she is here in the office to talk about that.  Denies have any chest pain tightness squeezing pressure burning chest.  Initially sent to [place] because of dyspnea on exertion she was found to have diastolic dysfunctions with preserved left ventricle ejection fraction.  Her ability to exercise limited because of arthritis in different joints.  Use cane for balance however she is able to walk in the store with very slow pace sometimes she has to use electric cart to move around.  Clearly difficult to assess her readiness for surgery from at that point review based on the story." Nuclear stress test was ordered, done 04/22/2020, showed no evidence of ischemia, low risk study.  Uses supplemental O2 at night.  Preop labs reviewed, mild anemia with hemoglobin 11.3, otherwise unremarkable.  EKG 04/24/2020: Normal sinus rhythm. Rate 78. Possible LAE.RBBB.  CT chest 04/24/2020: IMPRESSION: 1. Unchanged very large paraesophageal hiatal hernia with chronic organoaxial gastric volvulus. No acute inflammation or obstruction. 2. Aortic Atherosclerosis (ICD10-I70.0).  Nuclear stress 04/22/2020:  There was no ST segment deviation noted during stress.  Nuclear stress EF: 76%.  The left ventricular ejection fraction is hyperdynamic (>65%).  The study is normal.  This is a low risk study.   TTE 03/05/2020: 1. Left ventricular ejection fraction, by estimation, is 60 to 65%. The  left ventricle has normal function. The left ventricle has no regional  wall motion abnormalities. Left ventricular diastolic parameters are  consistent with Grade I diastolic  dysfunction (impaired relaxation).  2. Right ventricular systolic function is normal. The right  ventricular  size is normal. There is mildly elevated pulmonary artery systolic  pressure.  3. The mitral valve is normal in structure. No evidence of mitral valve  regurgitation. No evidence of mitral stenosis.  4. The aortic valve is normal in structure. Aortic valve regurgitation is  not visualized. No aortic stenosis is present.  5. The inferior vena cava is normal in size with greater than 50%  respiratory variability, suggesting right atrial pressure of 3 mmHg.    [name] [facility]/Anesthesiology Phone [phone] 04/25/2020 9:55 AM
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
## 2020-04-28 ENCOUNTER — Inpatient Hospital Stay (HOSPITAL_COMMUNITY): Payer: Medicare PPO

## 2020-04-28 ENCOUNTER — Inpatient Hospital Stay (HOSPITAL_COMMUNITY): Payer: Medicare PPO | Admitting: Certified Registered Nurse Anesthetist

## 2020-04-28 ENCOUNTER — Inpatient Hospital Stay (HOSPITAL_COMMUNITY): Payer: Medicare PPO | Admitting: Physician Assistant

## 2020-04-28 ENCOUNTER — Encounter (HOSPITAL_COMMUNITY): Payer: Self-pay | Admitting: Thoracic Surgery (Cardiothoracic Vascular Surgery)

## 2020-04-28 ENCOUNTER — Inpatient Hospital Stay (HOSPITAL_COMMUNITY)
Admission: RE | Admit: 2020-04-28 | Discharge: 2020-05-01 | DRG: 327 | Disposition: A | Discharge status: Home / Self Care | Payer: Medicare PPO | Attending: Thoracic Surgery (Cardiothoracic Vascular Surgery) | Admitting: Thoracic Surgery (Cardiothoracic Vascular Surgery)

## 2020-04-28 ENCOUNTER — Other Ambulatory Visit: Payer: Self-pay

## 2020-04-28 ENCOUNTER — Encounter (HOSPITAL_COMMUNITY)
Admission: RE | Disposition: A | Discharge status: Home / Self Care | Payer: Self-pay | Source: Home / Self Care | Attending: Thoracic Surgery (Cardiothoracic Vascular Surgery)

## 2020-04-28 DIAGNOSIS — I5032 Chronic diastolic (congestive) heart failure: Secondary | ICD-10-CM | POA: Diagnosis present

## 2020-04-28 DIAGNOSIS — R1310 Dysphagia, unspecified: Secondary | ICD-10-CM | POA: Diagnosis not present

## 2020-04-28 DIAGNOSIS — Z85820 Personal history of malignant melanoma of skin: Secondary | ICD-10-CM | POA: Diagnosis not present

## 2020-04-28 DIAGNOSIS — M1990 Unspecified osteoarthritis, unspecified site: Secondary | ICD-10-CM | POA: Diagnosis not present

## 2020-04-28 DIAGNOSIS — J90 Pleural effusion, not elsewhere classified: Secondary | ICD-10-CM | POA: Diagnosis not present

## 2020-04-28 DIAGNOSIS — M25512 Pain in left shoulder: Secondary | ICD-10-CM | POA: Diagnosis not present

## 2020-04-28 DIAGNOSIS — K219 Gastro-esophageal reflux disease without esophagitis: Secondary | ICD-10-CM | POA: Diagnosis not present

## 2020-04-28 DIAGNOSIS — Z79899 Other long term (current) drug therapy: Secondary | ICD-10-CM | POA: Diagnosis not present

## 2020-04-28 DIAGNOSIS — Z86711 Personal history of pulmonary embolism: Secondary | ICD-10-CM | POA: Diagnosis not present

## 2020-04-28 DIAGNOSIS — K449 Diaphragmatic hernia without obstruction or gangrene: Secondary | ICD-10-CM | POA: Diagnosis not present

## 2020-04-28 DIAGNOSIS — Z87440 Personal history of urinary (tract) infections: Secondary | ICD-10-CM

## 2020-04-28 DIAGNOSIS — K562 Volvulus: Secondary | ICD-10-CM | POA: Diagnosis not present

## 2020-04-28 DIAGNOSIS — M542 Cervicalgia: Secondary | ICD-10-CM | POA: Diagnosis not present

## 2020-04-28 DIAGNOSIS — J939 Pneumothorax, unspecified: Secondary | ICD-10-CM

## 2020-04-28 DIAGNOSIS — E876 Hypokalemia: Secondary | ICD-10-CM | POA: Diagnosis not present

## 2020-04-28 DIAGNOSIS — Z7982 Long term (current) use of aspirin: Secondary | ICD-10-CM

## 2020-04-28 DIAGNOSIS — K3189 Other diseases of stomach and duodenum: Secondary | ICD-10-CM | POA: Diagnosis present

## 2020-04-28 DIAGNOSIS — J9811 Atelectasis: Secondary | ICD-10-CM | POA: Diagnosis not present

## 2020-04-28 DIAGNOSIS — I517 Cardiomegaly: Secondary | ICD-10-CM | POA: Diagnosis not present

## 2020-04-28 DIAGNOSIS — E559 Vitamin D deficiency, unspecified: Secondary | ICD-10-CM | POA: Diagnosis not present

## 2020-04-28 DIAGNOSIS — I110 Hypertensive heart disease with heart failure: Secondary | ICD-10-CM | POA: Diagnosis present

## 2020-04-28 DIAGNOSIS — Z01818 Encounter for other preprocedural examination: Secondary | ICD-10-CM

## 2020-04-28 DIAGNOSIS — R0602 Shortness of breath: Secondary | ICD-10-CM | POA: Diagnosis not present

## 2020-04-28 HISTORY — PX: ESOPHAGOGASTRODUODENOSCOPY: SHX5428

## 2020-04-28 HISTORY — PX: XI ROBOTIC ASSISTED PARAESOPHAGEAL HERNIA REPAIR: SHX6871

## 2020-04-28 LAB — GLUCOSE, CAPILLARY: Glucose-Capillary: 143 mg/dL — ABNORMAL HIGH (ref 70–99)

## 2020-04-28 SURGERY — REPAIR, HERNIA, PARAESOPHAGEAL, ROBOT-ASSISTED
Anesthesia: General | Site: Chest

## 2020-04-28 MED ORDER — ONDANSETRON HCL 4 MG/2ML IJ SOLN
INTRAMUSCULAR | Status: AC
  Filled 2020-04-28: qty 2

## 2020-04-28 MED ORDER — PANTOPRAZOLE SODIUM 40 MG PO TBEC
40.0000 mg | DELAYED_RELEASE_TABLET | Freq: Every day | ORAL | Status: DC
  Administered 2020-04-29: 40 mg via ORAL
  Filled 2020-04-28: qty 1

## 2020-04-28 MED ORDER — ONDANSETRON HCL 4 MG/2ML IJ SOLN
4.0000 mg | Freq: Four times a day (QID) | INTRAMUSCULAR | Status: DC | PRN
  Administered 2020-04-30: 4 mg via INTRAVENOUS
  Filled 2020-04-28: qty 2

## 2020-04-28 MED ORDER — 0.9 % SODIUM CHLORIDE (POUR BTL) OPTIME
TOPICAL | Status: DC | PRN
  Administered 2020-04-28: 2000 mL

## 2020-04-28 MED ORDER — PHENYLEPHRINE 40 MCG/ML (10ML) SYRINGE FOR IV PUSH (FOR BLOOD PRESSURE SUPPORT)
PREFILLED_SYRINGE | INTRAVENOUS | Status: AC
  Filled 2020-04-28: qty 10

## 2020-04-28 MED ORDER — FENTANYL CITRATE (PF) 100 MCG/2ML IJ SOLN
INTRAMUSCULAR | Status: AC
  Filled 2020-04-28: qty 2

## 2020-04-28 MED ORDER — SODIUM CHLORIDE (PF) 0.9 % IJ SOLN
INTRAMUSCULAR | Status: AC
  Filled 2020-04-28: qty 10

## 2020-04-28 MED ORDER — ROCURONIUM BROMIDE 10 MG/ML (PF) SYRINGE
PREFILLED_SYRINGE | INTRAVENOUS | Status: AC
  Filled 2020-04-28: qty 10

## 2020-04-28 MED ORDER — INSULIN ASPART 100 UNIT/ML ~~LOC~~ SOLN
0.0000 [IU] | Freq: Four times a day (QID) | SUBCUTANEOUS | Status: DC
  Administered 2020-04-28 – 2020-04-29 (×2): 2 [IU] via SUBCUTANEOUS

## 2020-04-28 MED ORDER — EPHEDRINE SULFATE-NACL 50-0.9 MG/10ML-% IV SOSY
PREFILLED_SYRINGE | INTRAVENOUS | Status: DC | PRN
  Administered 2020-04-28 (×2): 5 mg via INTRAVENOUS

## 2020-04-28 MED ORDER — FENTANYL CITRATE (PF) 100 MCG/2ML IJ SOLN
25.0000 ug | INTRAMUSCULAR | Status: DC | PRN
  Administered 2020-04-28 (×5): 25 ug via INTRAVENOUS

## 2020-04-28 MED ORDER — DEXAMETHASONE SODIUM PHOSPHATE 10 MG/ML IJ SOLN
INTRAMUSCULAR | Status: AC
  Filled 2020-04-28: qty 1

## 2020-04-28 MED ORDER — SENNOSIDES-DOCUSATE SODIUM 8.6-50 MG PO TABS
1.0000 | ORAL_TABLET | Freq: Every day | ORAL | Status: DC
  Administered 2020-04-29: 1 via ORAL
  Filled 2020-04-28: qty 1

## 2020-04-28 MED ORDER — LACTATED RINGERS IV SOLN: INTRAVENOUS | Status: DC | PRN

## 2020-04-28 MED ORDER — ROCURONIUM BROMIDE 10 MG/ML (PF) SYRINGE
PREFILLED_SYRINGE | INTRAVENOUS | Status: DC | PRN
  Administered 2020-04-28: 60 mg via INTRAVENOUS
  Administered 2020-04-28: 20 mg via INTRAVENOUS

## 2020-04-28 MED ORDER — BISACODYL 5 MG PO TBEC: 10.0000 mg | DELAYED_RELEASE_TABLET | Freq: Every day | ORAL | Status: DC

## 2020-04-28 MED ORDER — BUPIVACAINE LIPOSOME 1.3 % IJ SUSP
20.0000 mL | Freq: Once | INTRAMUSCULAR | Status: AC
  Administered 2020-04-28: 20 mL
  Filled 2020-04-28: qty 20

## 2020-04-28 MED ORDER — ALBUMIN HUMAN 5 % IV SOLN: INTRAVENOUS | Status: DC | PRN

## 2020-04-28 MED ORDER — ACETAMINOPHEN 500 MG PO TABS
1000.0000 mg | ORAL_TABLET | Freq: Four times a day (QID) | ORAL | Status: DC
  Administered 2020-04-29 – 2020-04-30 (×3): 1000 mg via ORAL
  Filled 2020-04-28 (×4): qty 2

## 2020-04-28 MED ORDER — SUGAMMADEX SODIUM 200 MG/2ML IV SOLN
INTRAVENOUS | Status: DC | PRN
  Administered 2020-04-28: 200 mg via INTRAVENOUS

## 2020-04-28 MED ORDER — CHLORHEXIDINE GLUCONATE 0.12 % MT SOLN: 15.0000 mL | Freq: Once | OROMUCOSAL | Status: AC

## 2020-04-28 MED ORDER — CEFAZOLIN SODIUM-DEXTROSE 2-4 GM/100ML-% IV SOLN
2.0000 g | Freq: Three times a day (TID) | INTRAVENOUS | Status: AC
Start: 2020-04-28 — End: 2020-04-28
  Administered 2020-04-28 (×2): 2 g via INTRAVENOUS
  Filled 2020-04-28: qty 100

## 2020-04-28 MED ORDER — FENTANYL CITRATE (PF) 250 MCG/5ML IJ SOLN
INTRAMUSCULAR | Status: DC | PRN
  Administered 2020-04-28: 75 ug via INTRAVENOUS
  Administered 2020-04-28: 50 ug via INTRAVENOUS
  Administered 2020-04-28: 25 ug via INTRAVENOUS
  Administered 2020-04-28 (×2): 50 ug via INTRAVENOUS

## 2020-04-28 MED ORDER — ASPIRIN EC 81 MG PO TBEC
81.0000 mg | DELAYED_RELEASE_TABLET | Freq: Every day | ORAL | Status: DC
  Administered 2020-04-29: 81 mg via ORAL
  Filled 2020-04-28: qty 1

## 2020-04-28 MED ORDER — BUPIVACAINE HCL (PF) 0.5 % IJ SOLN
INTRAMUSCULAR | Status: AC
  Filled 2020-04-28: qty 30

## 2020-04-28 MED ORDER — ORAL CARE MOUTH RINSE: 15.0000 mL | Freq: Once | OROMUCOSAL | Status: AC

## 2020-04-28 MED ORDER — CHLORHEXIDINE GLUCONATE 0.12 % MT SOLN
OROMUCOSAL | Status: AC
  Administered 2020-04-28: 15 mL via OROMUCOSAL
  Filled 2020-04-28: qty 15

## 2020-04-28 MED ORDER — PHENYLEPHRINE 40 MCG/ML (10ML) SYRINGE FOR IV PUSH (FOR BLOOD PRESSURE SUPPORT)
PREFILLED_SYRINGE | INTRAVENOUS | Status: DC | PRN
  Administered 2020-04-28: 120 ug via INTRAVENOUS

## 2020-04-28 MED ORDER — KETOROLAC TROMETHAMINE 15 MG/ML IJ SOLN
INTRAMUSCULAR | Status: AC
  Filled 2020-04-28: qty 1

## 2020-04-28 MED ORDER — LIDOCAINE 2% (20 MG/ML) 5 ML SYRINGE
INTRAMUSCULAR | Status: AC
  Filled 2020-04-28: qty 5

## 2020-04-28 MED ORDER — PROPOFOL 10 MG/ML IV BOLUS
INTRAVENOUS | Status: AC
  Filled 2020-04-28: qty 20

## 2020-04-28 MED ORDER — EPHEDRINE 5 MG/ML INJ
INTRAVENOUS | Status: AC
  Filled 2020-04-28: qty 10

## 2020-04-28 MED ORDER — KETOROLAC TROMETHAMINE 15 MG/ML IJ SOLN
15.0000 mg | Freq: Four times a day (QID) | INTRAMUSCULAR | Status: AC
  Administered 2020-04-28 – 2020-04-30 (×8): 15 mg via INTRAVENOUS
  Filled 2020-04-28 (×7): qty 1

## 2020-04-28 MED ORDER — CEFAZOLIN SODIUM-DEXTROSE 2-4 GM/100ML-% IV SOLN
INTRAVENOUS | Status: AC
  Filled 2020-04-28: qty 100

## 2020-04-28 MED ORDER — FERROUS SULFATE 325 (65 FE) MG PO TABS: 325.0000 mg | ORAL_TABLET | Freq: Every day | ORAL | Status: DC

## 2020-04-28 MED ORDER — PROPOFOL 10 MG/ML IV BOLUS
INTRAVENOUS | Status: DC | PRN
Start: 2020-04-28 — End: 2020-04-28
  Administered 2020-04-28: 130 mg via INTRAVENOUS
  Administered 2020-04-28: 30 mg via INTRAVENOUS
  Administered 2020-04-28: 20 mg via INTRAVENOUS

## 2020-04-28 MED ORDER — ONDANSETRON HCL 4 MG/2ML IJ SOLN
INTRAMUSCULAR | Status: DC | PRN
  Administered 2020-04-28: 4 mg via INTRAVENOUS

## 2020-04-28 MED ORDER — MORPHINE SULFATE (PF) 2 MG/ML IV SOLN
1.0000 mg | INTRAVENOUS | Status: DC | PRN
Start: 2020-04-28 — End: 2020-05-01

## 2020-04-28 MED ORDER — ACETAMINOPHEN 160 MG/5ML PO SOLN
1000.0000 mg | Freq: Four times a day (QID) | ORAL | Status: DC
  Administered 2020-04-30 – 2020-05-01 (×4): 1000 mg via ORAL
  Filled 2020-04-28 (×4): qty 40.6

## 2020-04-28 MED ORDER — HEMOSTATIC AGENTS (NO CHARGE) OPTIME
TOPICAL | Status: DC | PRN
  Administered 2020-04-28: 1 via TOPICAL

## 2020-04-28 MED ORDER — BUPIVACAINE HCL 0.5 % IJ SOLN
INTRAMUSCULAR | Status: DC | PRN
  Administered 2020-04-28: 30 mL

## 2020-04-28 MED ORDER — LIDOCAINE 2% (20 MG/ML) 5 ML SYRINGE
INTRAMUSCULAR | Status: DC | PRN
  Administered 2020-04-28 (×2): 50 mg via INTRAVENOUS

## 2020-04-28 MED ORDER — TRAMADOL HCL 50 MG PO TABS: 50.0000 mg | ORAL_TABLET | Freq: Four times a day (QID) | ORAL | Status: DC | PRN

## 2020-04-28 MED ORDER — PHENYLEPHRINE HCL-NACL 10-0.9 MG/250ML-% IV SOLN
INTRAVENOUS | Status: DC | PRN
  Administered 2020-04-28: 25 ug/min via INTRAVENOUS

## 2020-04-28 MED ORDER — FENTANYL CITRATE (PF) 250 MCG/5ML IJ SOLN
INTRAMUSCULAR | Status: AC
  Filled 2020-04-28: qty 5

## 2020-04-28 MED ORDER — ONDANSETRON HCL 4 MG/2ML IJ SOLN
4.0000 mg | Freq: Once | INTRAMUSCULAR | Status: AC | PRN
  Administered 2020-04-28: 4 mg via INTRAVENOUS

## 2020-04-28 MED ORDER — DEXAMETHASONE SODIUM PHOSPHATE 10 MG/ML IJ SOLN
INTRAMUSCULAR | Status: DC | PRN
  Administered 2020-04-28: 10 mg via INTRAVENOUS

## 2020-04-28 MED ORDER — TRIMETHOPRIM 100 MG PO TABS
100.0000 mg | ORAL_TABLET | Freq: Every day | ORAL | Status: DC
  Administered 2020-04-29 – 2020-05-01 (×3): 100 mg via ORAL
  Filled 2020-04-28 (×3): qty 1

## 2020-04-28 MED ORDER — CEFAZOLIN SODIUM-DEXTROSE 2-4 GM/100ML-% IV SOLN
2.0000 g | INTRAVENOUS | Status: AC
  Administered 2020-04-28: 2 g via INTRAVENOUS

## 2020-04-28 MED ORDER — LACTATED RINGERS IV SOLN: INTRAVENOUS | Status: DC

## 2020-04-28 SURGICAL SUPPLY — 78 items
BLADE SURG 11 STRL SS (BLADE) ×4 IMPLANT
BUTTON OLYMPUS DEFENDO 5 PIECE (MISCELLANEOUS) ×4 IMPLANT
CANISTER SUCT 3000ML PPV (MISCELLANEOUS) ×8 IMPLANT
CANNULA REDUC XI 12-8 STAPL (CANNULA)
CANNULA REDUC XI 12-8MM STAPL (CANNULA)
CANNULA REDUCER 12-8 DVNC XI (CANNULA) IMPLANT
CATH THORACIC 28FR (CATHETERS) IMPLANT
CNTNR URN SCR LID CUP LEK RST (MISCELLANEOUS) ×8 IMPLANT
CONT SPEC 4OZ STRL OR WHT (MISCELLANEOUS) ×8
DEFOGGER SCOPE WARMER CLEARIFY (MISCELLANEOUS) ×4 IMPLANT
DERMABOND ADVANCED (GAUZE/BANDAGES/DRESSINGS) ×2
DERMABOND ADVANCED .7 DNX12 (GAUZE/BANDAGES/DRESSINGS) ×2 IMPLANT
DEVICE SUTURE ENDOST 10MM (ENDOMECHANICALS) ×4 IMPLANT
DRAIN PENROSE 1/4X12 LTX STRL (WOUND CARE) ×4 IMPLANT
DRAPE ARM DVNC X/XI (DISPOSABLE) ×8 IMPLANT
DRAPE COLUMN DVNC XI (DISPOSABLE) ×2 IMPLANT
DRAPE CV SPLIT W-CLR ANES SCRN (DRAPES) ×4 IMPLANT
DRAPE DA VINCI XI ARM (DISPOSABLE) ×8
DRAPE DA VINCI XI COLUMN (DISPOSABLE) ×2
DRAPE INCISE IOBAN 66X45 STRL (DRAPES) IMPLANT
DRAPE ORTHO SPLIT 77X108 STRL (DRAPES) ×2
DRAPE SLUSH/WARMER DISC (DRAPES) IMPLANT
DRAPE SURG ORHT 6 SPLT 77X108 (DRAPES) ×2 IMPLANT
ELECT REM PT RETURN 9FT ADLT (ELECTROSURGICAL) ×4
ELECTRODE REM PT RTRN 9FT ADLT (ELECTROSURGICAL) ×2 IMPLANT
FELT TEFLON 1X6 (MISCELLANEOUS) ×4 IMPLANT
GAUZE SPONGE 4X4 12PLY STRL (GAUZE/BANDAGES/DRESSINGS) IMPLANT
GLOVE BIO SURGEON STRL SZ7.5 (GLOVE) ×12 IMPLANT
GOWN STRL REUS W/ TWL LRG LVL3 (GOWN DISPOSABLE) ×6 IMPLANT
GOWN STRL REUS W/ TWL XL LVL3 (GOWN DISPOSABLE) ×4 IMPLANT
GOWN STRL REUS W/TWL 2XL LVL3 (GOWN DISPOSABLE) ×4 IMPLANT
GOWN STRL REUS W/TWL LRG LVL3 (GOWN DISPOSABLE) ×6
GOWN STRL REUS W/TWL XL LVL3 (GOWN DISPOSABLE) ×4
GRASPER SUT TROCAR 14GX15 (MISCELLANEOUS) ×4 IMPLANT
HEMOSTAT SURGICEL 2X14 (HEMOSTASIS) ×4 IMPLANT
IV NS 1000ML (IV SOLUTION)
IV NS 1000ML BAXH (IV SOLUTION) IMPLANT
KIT BASIN OR (CUSTOM PROCEDURE TRAY) ×4 IMPLANT
KIT TURNOVER KIT B (KITS) ×4 IMPLANT
NEEDLE 18GX1X1/2 (RX/OR ONLY) (NEEDLE) IMPLANT
NS IRRIG 1000ML POUR BTL (IV SOLUTION) ×8 IMPLANT
OBTURATOR OPTICAL STANDARD 8MM (TROCAR) ×2
OBTURATOR OPTICAL STND 8 DVNC (TROCAR) ×2
OBTURATOR OPTICALSTD 8 DVNC (TROCAR) ×2 IMPLANT
PACK CHEST (CUSTOM PROCEDURE TRAY) ×4 IMPLANT
PAD ARMBOARD 7.5X6 YLW CONV (MISCELLANEOUS) ×8 IMPLANT
PORT ACCESS TROCAR AIRSEAL 12 (TROCAR) ×2 IMPLANT
PORT ACCESS TROCAR AIRSEAL 5M (TROCAR) ×2
SEAL CANN UNIV 5-8 DVNC XI (MISCELLANEOUS) ×8 IMPLANT
SEAL XI 5MM-8MM UNIVERSAL (MISCELLANEOUS) ×8
SEALER LIGASURE MARYLAND 30 (ELECTROSURGICAL) ×4 IMPLANT
SEALER SYNCHRO 8 IS4000 DV (MISCELLANEOUS) ×2
SEALER SYNCHRO 8 IS4000 DVNC (MISCELLANEOUS) ×2 IMPLANT
SET TRI-LUMEN FLTR TB AIRSEAL (TUBING) ×8 IMPLANT
SHEET MEDIUM DRAPE 40X70 STRL (DRAPES) IMPLANT
STAPLER CANNULA SEAL DVNC XI (STAPLE) IMPLANT
STAPLER CANNULA SEAL XI (STAPLE)
SUT ETHIBOND 0 36 GRN (SUTURE) ×12 IMPLANT
SUT SILK  1 MH (SUTURE) ×2
SUT SILK 1 MH (SUTURE) ×2 IMPLANT
SUT SURGIDAC NAB ES-9 0 48 120 (SUTURE) ×12 IMPLANT
SUT VIC AB 3-0 SH 27 (SUTURE) ×4
SUT VIC AB 3-0 SH 27X BRD (SUTURE) ×4 IMPLANT
SUT VIC AB 3-0 X1 27 (SUTURE) ×8 IMPLANT
SUT VICRYL 0 UR6 27IN ABS (SUTURE) ×8 IMPLANT
SYR 10ML LL (SYRINGE) IMPLANT
SYR 20CC LL (SYRINGE) ×4 IMPLANT
SYSTEM RETRIEVAL ANCHOR 8 (MISCELLANEOUS) ×4 IMPLANT
SYSTEM SAHARA CHEST DRAIN ATS (WOUND CARE) IMPLANT
TOWEL GREEN STERILE (TOWEL DISPOSABLE) ×4 IMPLANT
TOWEL GREEN STERILE FF (TOWEL DISPOSABLE) ×4 IMPLANT
TRAY FOLEY MTR SLVR 16FR STAT (SET/KITS/TRAYS/PACK) ×4 IMPLANT
TROCAR XCEL BLADELESS 5X75MML (TROCAR) ×4 IMPLANT
TROCAR XCEL NON-BLD 5MMX100MML (ENDOMECHANICALS) IMPLANT
TUBE CONNECTING 20'X1/4 (TUBING) ×1
TUBE CONNECTING 20X1/4 (TUBING) ×3 IMPLANT
TUBING ENDO SMARTCAP (MISCELLANEOUS) ×8 IMPLANT
WATER STERILE IRR 1000ML POUR (IV SOLUTION) ×4 IMPLANT

## 2020-04-28 NOTE — Brief Op Note (Addendum)
04/28/2020  11:23 AM  PATIENT:  [name]  86 y.o. female  PRE-OPERATIVE DIAGNOSIS:  [place] with ORGANOAXIAL VOLVULUS  POST-OPERATIVE DIAGNOSIS:  [place] with ORGANOAXIAL VOLVULUS  PROCEDURE:  XI ROBOTIC ASSISTED PARAESOPHAGEAL HERNIA REPAIR WITH GASTROPEXY , ESOPHAGOGASTRODUODENOSCOPY (EGD), XI ROBOTIC ASSISTED LAPAROSCOPIC NISSEN FUNDOPLICATION   SURGEON:  Surgeon(s) and Role:    Lightfoot, Lucile Crater, MD - Primary  PHYSICIAN ASSISTANT: Lars Pinks PA-C  ANESTHESIA:   general  EBL:  50 mL   BLOOD ADMINISTERED:none  DRAINS: none   LOCAL MEDICATIONS USED:  OTHER Exparel  COUNTS CORRECT:  YES  DICTATION: .Dragon Dictation  PLAN OF CARE: Admit to inpatient   PATIENT DISPOSITION:  PACU - hemodynamically stable.   Delay start of Pharmacological VTE agent (>24hrs) due to surgical blood loss or risk of bleeding: yes

## 2020-04-28 NOTE — Transfer of Care (Signed)
Immediate Anesthesia Transfer of Care Note  Patient: [name]  Procedure(s) Performed: XI ROBOTIC ASSISTED PARAESOPHAGEAL HERNIA REPAIR WITH GASTROPEXY (N/A Chest) ESOPHAGOGASTRODUODENOSCOPY (EGD) (N/A )  Patient Location: PACU  Anesthesia Type:General  Level of Consciousness: awake, alert  and oriented  Airway & Oxygen Therapy: Patient Spontanous Breathing and Patient connected to nasal cannula oxygen  Post-op Assessment: Report given to RN, Post -op Vital signs reviewed and stable and Patient moving all extremities X 4  Post vital signs: Reviewed and stable  Last Vitals:  Vitals Value Taken Time  BP 124/53 04/28/20 1148  Temp    Pulse 69 04/28/20 1149  Resp 20 04/28/20 1149  SpO2 95 % 04/28/20 1149  Vitals shown include unvalidated device data.  Last Pain:  Vitals:   04/28/20 0606  TempSrc:   PainSc: 0-No pain      Patients Stated Pain Goal: 5 (16/01/09 3235)  Complications: No complications documented.

## 2020-04-28 NOTE — Anesthesia Procedure Notes (Signed)
Procedure Name: Intubation Date/Time: 04/28/2020 7:39 AM Performed by: Harden Mo, CRNA Pre-anesthesia Checklist: Patient identified, Emergency Drugs available, Suction available and Patient being monitored Patient Re-evaluated:Patient Re-evaluated prior to induction Oxygen Delivery Method: Circle System Utilized Preoxygenation: Pre-oxygenation with 100% oxygen Induction Type: IV induction Ventilation: Mask ventilation without difficulty and Oral airway inserted - appropriate to patient size Laryngoscope Size: Sabra Heck and 2 Grade View: Grade I Tube type: Oral Tube size: 7.5 mm Number of attempts: 1 Airway Equipment and Method: Stylet and Oral airway Placement Confirmation: ETT inserted through vocal cords under direct vision,  positive ETCO2 and breath sounds checked- equal and bilateral Tube secured with: Tape Dental Injury: Teeth and Oropharynx as per pre-operative assessment
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
## 2020-04-28 NOTE — Progress Notes (Signed)
Admission from PACU by bed awake and alert. 

## 2020-04-28 NOTE — Op Note (Signed)
[address]       [place] [address]             [phone]        04/28/2020  Patient:  [name] Pre-Op Dx: Paraesophageal hernia   Organoaxial gastric volvulus Post-op Dx: Same Procedure: - Esophagoscopy - Robotic assisted laparoscopy - Paraesophageal hernia repair with felt pledgets for the crural reconstruction   Surgeon and Role:      * Lajuana Matte, MD - Primary    *D. Tacy Dura, PA-C- assisting  Anesthesia  general EBL: Minimal  Blood Administration: None Specimen: Hernia sac   Counts: correct   Indications: 86 year old female with a large paraesophageal hernia with concerning features of volvulus on cross-sectional imaging.  We discussed the risks, benefits and alternatives of robotic assisted laparoscopy with paraesophageal hernia repair.  Given the size of her hernia I do not think that a fundoplication would be necessary.  I likely will use mesh at the time of surgery, and also perform a gastropexy.  She is agreeable to proceed.  Findings: Very large hiatal hernia with stomach and omentum within the hernia sac.  She had good integrity to both coronal muscles, but given the size of her hiatal hernia I was concerned for recurrence.  Thus I elected to use felt pledgets to buttress the reapproximation of the crural muscle.  Operative Technique: After the risks, benefits and alternatives were thoroughly discussed, the patient was brought to the operative theatre.  Anesthesia was induced, and the esophagoscope was passed through the oropharynx down to the stomach.  The scope was retroflexed and the hiatal hernia was clearly evident.  The scope was pulled back and the mucosal surface of the esophagus was visualized.    The scope was then parked at 25 cm from the incisors.  The patient was then prepped and draped in normal sterile fashion.  An appropriate surgical pause was performed, and pre-operative antibiotics were dosed  accordingly.  We began with a 1 cm incision 15 cm caudad from the xiphoid and slightly lateral to the umbilicus.  Using an Optiview we entered the peritoneal space.  The abdomen was then insufflated with CO2.  3 other robotic ports were placed to triangulate the hiatus.  Another 12 mm port was placed in place at the level of the umbilicus laterally for an assistant port and another 5 mm trocar was placed in the right lower quadrant for liver retractor.  The patient was then placed in steep reverse Trendelenburg and the liver was elevated to expose the esophageal hiatus.  And then the robot was docked.  We began by dividing the gastrohepatic ligament to expose the right diaphragmatic crus and then dissected the hernia sac in a clockwise fashion to mobilize there the stomach and esophagus.  We then divided the short gastrics and moved towards the right crus and completed our dissection along the esophageal hiatus.  A Penrose drain was then used to encircle the the esophagus and we continued our dissection up into the mediastinum.  Once we had achieved 3 to 4 cm of intra-abdominal esophagus we then proceeded to reapproximate the crura with 0 Ethibond sutures in an interrupted fashion.  Felt pledgets were used to buttress the crural repair.  The gastroscope was passed down through the lower esophageal sphincter into the stomach and would act as our bougie during this repair.  An air leak test was performed using the gastroscope.  No leak was  evident.  The liver retractor was removed and all ports were removed under direct visualization.  The skin and soft tissue were closed with absorbable suture    The patient tolerated the procedure without any immediate complications, and was transferred to the PACU in stable condition.  [name]

## 2020-04-28 NOTE — Anesthesia Postprocedure Evaluation (Signed)
Anesthesia Post Note  Patient: [name]  Procedure(s) Performed: XI ROBOTIC ASSISTED PARAESOPHAGEAL HERNIA REPAIR WITH GASTROPEXY (N/A Chest) ESOPHAGOGASTRODUODENOSCOPY (EGD) (N/A )     Patient location during evaluation: PACU Anesthesia Type: General Level of consciousness: awake and alert Pain management: pain level controlled Vital Signs Assessment: post-procedure vital signs reviewed and stable Respiratory status: spontaneous breathing, nonlabored ventilation, respiratory function stable and patient connected to nasal cannula oxygen Cardiovascular status: blood pressure returned to baseline and stable Postop Assessment: no apparent nausea or vomiting Anesthetic complications: no   No complications documented.  Last Vitals:  Vitals:   04/28/20 1432 04/28/20 1515  BP: (!) 156/68 (!) 153/71  Pulse: 79 78  Resp: 12 16  Temp:    SpO2: 97% 95%    Last Pain:  Vitals:   04/28/20 1515  TempSrc:   PainSc: Golden

## 2020-04-28 NOTE — Interval H&P Note (Signed)
History and Physical Interval Note:  04/28/2020 7:27 AM  [name]  has presented today for surgery, with the diagnosis of HIATAL HERNIA.  The various methods of treatment have been discussed with the patient and family. After consideration of risks, benefits and other options for treatment, the patient has consented to  Procedure(s) with comments: XI ROBOTIC ASSISTED PARAESOPHAGEAL HERNIA REPAIR WITH POSSIBLE GASTROPEXY (N/A) - possible absorbable mesh  possible fundoplication as a surgical intervention.  The patient's history has been reviewed, patient examined, no change in status, stable for surgery.  I have reviewed the patient's chart and labs.  Questions were answered to the patient's satisfaction.     Viyan Rosamond Bary Leriche

## 2020-04-29 ENCOUNTER — Inpatient Hospital Stay (HOSPITAL_COMMUNITY): Payer: Medicare PPO

## 2020-04-29 ENCOUNTER — Encounter (HOSPITAL_COMMUNITY): Payer: Self-pay | Admitting: Thoracic Surgery (Cardiothoracic Vascular Surgery)

## 2020-04-29 LAB — BASIC METABOLIC PANEL
Anion gap: 10 (ref 5–15)
BUN: 16 mg/dL (ref 8–23)
CO2: 23 mmol/L (ref 22–32)
Calcium: 7.9 mg/dL — ABNORMAL LOW (ref 8.9–10.3)
Chloride: 103 mmol/L (ref 98–111)
Creatinine, Ser: 0.91 mg/dL (ref 0.44–1.00)
GFR, Estimated: >60 mL/min (ref >60)
Glucose, Bld: 127 mg/dL — ABNORMAL HIGH (ref 70–99)
Potassium: 4.7 mmol/L (ref 3.5–5.1)
Sodium: 136 mmol/L (ref 135–145)

## 2020-04-29 LAB — CBC
HCT: 29 % — ABNORMAL LOW (ref 36.0–46.0)
Hemoglobin: 9.4 g/dL — ABNORMAL LOW (ref 12.0–15.0)
MCH: 32.3 pg (ref 26.0–34.0)
MCHC: 32.4 g/dL (ref 30.0–36.0)
MCV: 99.7 fL (ref 80.0–100.0)
Platelets: 258 10*3/uL (ref 150–400)
RBC: 2.91 MIL/uL — ABNORMAL LOW (ref 3.87–5.11)
RDW: 14.6 % (ref 11.5–15.5)
WBC: 5.6 10*3/uL (ref 4.0–10.5)
nRBC: 0 % (ref 0.0–0.2)

## 2020-04-29 LAB — GLUCOSE, CAPILLARY
Glucose-Capillary: 104 mg/dL — ABNORMAL HIGH (ref 70–99)
Glucose-Capillary: 109 mg/dL — ABNORMAL HIGH (ref 70–99)
Glucose-Capillary: 122 mg/dL — ABNORMAL HIGH (ref 70–99)
Glucose-Capillary: 85 mg/dL (ref 70–99)
Glucose-Capillary: 92 mg/dL (ref 70–99)

## 2020-04-29 LAB — SURGICAL PATHOLOGY

## 2020-04-29 MED ORDER — IOHEXOL 300 MG/ML  SOLN
150.0000 mL | Freq: Once | INTRAMUSCULAR | Status: AC | PRN
  Administered 2020-04-29: 75 mL via ORAL

## 2020-04-29 NOTE — Progress Notes (Signed)
Complained of soreness on shoulders and neck.  Hot packs applied x 20 min while waiting for the availability of K Pad. Claimed feels better.

## 2020-04-29 NOTE — Progress Notes (Signed)
Back from radiology by bed awake and alert., weaned off oxygen , destas to 86% placed back on oxygen  2L sat-94%. Continue to monitor.

## 2020-04-29 NOTE — Progress Notes (Signed)
      [address]       [place] [address]             [phone]       1 Day Post-Op Procedure(s) (LRB): XI ROBOTIC ASSISTED PARAESOPHAGEAL HERNIA REPAIR WITH GASTROPEXY (N/A) ESOPHAGOGASTRODUODENOSCOPY (EGD) (N/A)  Subjective: Patient with neck and left shoulder pain this am.  Objective: Vital signs in last 24 hours: Temp:  [97 F (36.1 C)-98.7 F (37.1 C)] 97.3 F (36.3 C) (01/11 0710) Pulse Rate:  [69-87] 73 (01/11 0710) Cardiac Rhythm: Normal sinus rhythm (01/11 0710) Resp:  [10-23] 16 (01/11 0400) BP: (118-159)/(53-86) 118/55 (01/11 0400) SpO2:  [92 %-99 %] 95 % (01/11 0400)     Intake/Output from previous day: 01/10 0701 - 01/11 0700 In: 2300 [I.V.:1900; IV Piggyback:350] Out: 505 [Urine:455; Blood:50]   Physical Exam:  Cardiovascular: RRR Pulmonary: Clear to auscultation bilaterally Abdomen: Soft, non tender, bowel sounds present. Extremities: SCDs in place Wounds: Clean and dry.  No erythema or signs of infection.   Lab Results: KVQ:QVZDGL Labs    04/29/20 0023  WBC 5.6  HGB 9.4*  HCT 29.0*  PLT 258   BMET:  Recent Labs    04/29/20 0023  NA 136  K 4.7  CL 103  CO2 23  GLUCOSE 127*  BUN 16  CREATININE 0.91  CALCIUM 7.9*    PT/INR: No results for input(s): LABPROT, INR in the last 72 hours. ABG:  INR: Will add last result for INR, ABG once components are confirmed Will add last 4 CBG results once components are confirmed  Assessment/Plan:  1. CV - SR. 2.  Pulmonary - On 2 liters of oxygen via [place]. Wean as able. 3. GI-Await esophagram. If results show no leak, will start clear liquids. 4. K pad for neck and left shoulder pain. Likely related to postioning at surgery.   Ailah Barna M ZimmermanPA-C 04/29/2020,7:50 AM [phone]

## 2020-04-29 NOTE — Progress Notes (Signed)
Started on clear liquid diet tolerated well.

## 2020-04-29 NOTE — Progress Notes (Signed)
Transported to  Radiology by bed for esophagram awake and alert and stable.

## 2020-04-30 LAB — CBC
HCT: 28.3 % — ABNORMAL LOW (ref 36.0–46.0)
Hemoglobin: 9.1 g/dL — ABNORMAL LOW (ref 12.0–15.0)
MCH: 32.7 pg (ref 26.0–34.0)
MCHC: 32.2 g/dL (ref 30.0–36.0)
MCV: 101.8 fL — ABNORMAL HIGH (ref 80.0–100.0)
Platelets: 223 10*3/uL (ref 150–400)
RBC: 2.78 MIL/uL — ABNORMAL LOW (ref 3.87–5.11)
RDW: 14.8 % (ref 11.5–15.5)
WBC: 10.2 10*3/uL (ref 4.0–10.5)
nRBC: 0.2 % (ref 0.0–0.2)

## 2020-04-30 LAB — GLUCOSE, CAPILLARY: Glucose-Capillary: 99 mg/dL (ref 70–99)

## 2020-04-30 LAB — COMPREHENSIVE METABOLIC PANEL
ALT: 55 U/L — ABNORMAL HIGH (ref 0–44)
AST: 77 U/L — ABNORMAL HIGH (ref 15–41)
Albumin: 2.8 g/dL — ABNORMAL LOW (ref 3.5–5.0)
Alkaline Phosphatase: 50 U/L (ref 38–126)
Anion gap: 9 (ref 5–15)
BUN: 14 mg/dL (ref 8–23)
CO2: 25 mmol/L (ref 22–32)
Calcium: 7.7 mg/dL — ABNORMAL LOW (ref 8.9–10.3)
Chloride: 102 mmol/L (ref 98–111)
Creatinine, Ser: 0.89 mg/dL (ref 0.44–1.00)
GFR, Estimated: >60 mL/min (ref >60)
Glucose, Bld: 108 mg/dL — ABNORMAL HIGH (ref 70–99)
Potassium: 3.7 mmol/L (ref 3.5–5.1)
Sodium: 136 mmol/L (ref 135–145)
Total Bilirubin: 0.8 mg/dL (ref 0.3–1.2)
Total Protein: 4.9 g/dL — ABNORMAL LOW (ref 6.5–8.1)

## 2020-04-30 MED ORDER — FERROUS SULFATE 300 (60 FE) MG/5ML PO SYRP
300.0000 mg | ORAL_SOLUTION | Freq: Every day | ORAL | Status: DC
  Administered 2020-04-30 – 2020-05-01 (×2): 300 mg via ORAL
  Filled 2020-04-30 (×2): qty 5

## 2020-04-30 MED ORDER — ONDANSETRON HCL 4 MG/2ML IJ SOLN
4.0000 mg | Freq: Four times a day (QID) | INTRAMUSCULAR | Status: DC
  Administered 2020-05-01: 4 mg via INTRAVENOUS
  Filled 2020-04-30: qty 2

## 2020-04-30 MED ORDER — ONDANSETRON 4 MG PO TBDP: 4.0000 mg | ORAL_TABLET | Freq: Three times a day (TID) | ORAL | 0 refills | Status: DC | PRN

## 2020-04-30 MED ORDER — ACETAMINOPHEN 500 MG PO TABS: 500.0000 mg | ORAL_TABLET | Freq: Four times a day (QID) | ORAL | 0 refills | Status: DC | PRN

## 2020-04-30 MED ORDER — METOCLOPRAMIDE HCL 5 MG/ML IJ SOLN
10.0000 mg | Freq: Four times a day (QID) | INTRAMUSCULAR | Status: AC
  Administered 2020-04-30 – 2020-05-01 (×5): 10 mg via INTRAVENOUS
  Filled 2020-04-30 (×5): qty 2

## 2020-04-30 MED ORDER — PANTOPRAZOLE SODIUM 40 MG PO PACK
40.0000 mg | PACK | Freq: Every day | ORAL | Status: DC
  Administered 2020-04-30 – 2020-05-01 (×2): 40 mg via ORAL
  Filled 2020-04-30 (×2): qty 20

## 2020-04-30 MED ORDER — POTASSIUM CHLORIDE ER 10 MEQ PO TBCR: 10.0000 meq | EXTENDED_RELEASE_TABLET | Freq: Every day | ORAL | Status: DC

## 2020-04-30 MED ORDER — SENNOSIDES 8.8 MG/5ML PO SYRP
5.0000 mL | ORAL_SOLUTION | Freq: Every day | ORAL | Status: DC
  Administered 2020-04-30: 5 mL via ORAL
  Filled 2020-04-30: qty 5

## 2020-04-30 MED ORDER — ASPIRIN 81 MG PO CHEW
81.0000 mg | CHEWABLE_TABLET | Freq: Every day | ORAL | Status: DC
  Administered 2020-04-30 – 2020-05-01 (×2): 81 mg via ORAL
  Filled 2020-04-30 (×2): qty 1

## 2020-04-30 MED ORDER — TRAMADOL HCL 50 MG PO TABS: 50.0000 mg | ORAL_TABLET | Freq: Four times a day (QID) | ORAL | 0 refills | Status: DC | PRN

## 2020-04-30 NOTE — Plan of Care (Signed)

## 2020-04-30 NOTE — Progress Notes (Addendum)
      [address]       [place] [address]             [phone]       2 Days Post-Op Procedure(s) (LRB): XI ROBOTIC ASSISTED PARAESOPHAGEAL HERNIA REPAIR WITH GASTROPEXY (N/A) ESOPHAGOGASTRODUODENOSCOPY (EGD) (N/A)  Subjective: Patient drinking broth this am. She states she feels like liquids getting stuck lower sternal area.  Objective: Vital signs in last 24 hours: Temp:  [98.1 F (36.7 C)-98.7 F (37.1 C)] 98.7 F (37.1 C) (01/12 0350) Pulse Rate:  [62-79] 70 (01/12 0350) Cardiac Rhythm: Normal sinus rhythm (01/11 1900) Resp:  [15-21] 19 (01/12 0350) BP: (112-141)/(42-61) 134/59 (01/12 0350) SpO2:  [92 %-98 %] 98 % (01/12 0350)     Intake/Output from previous day: 01/11 0701 - 01/12 0700 In: 377 [P.O.:377] Out: -    Physical Exam:  Cardiovascular: RRR Pulmonary: Clear to auscultation bilaterally Abdomen: Soft, non tender, bowel sounds present. Extremities: No LE edema Wounds: Clean and dry.  No erythema or signs of infection.   Lab Results: CBC: Recent Labs    04/29/20 0023 04/30/20 0030  WBC 5.6 10.2  HGB 9.4* 9.1*  HCT 29.0* 28.3*  PLT 258 223   BMET:  Recent Labs    04/29/20 0023 04/30/20 0030  NA 136 136  K 4.7 3.7  CL 103 102  CO2 23 25  GLUCOSE 127* 108*  BUN 16 14  CREATININE 0.91 0.89  CALCIUM 7.9* 7.7*    PT/INR: No results for input(s): LABPROT, INR in the last 72 hours. ABG:  INR: Will add last result for INR, ABG once components are confirmed Will add last 4 CBG results once components are confirmed  Assessment/Plan:  1. CV - SR. 2.  Pulmonary - On 2 liters of oxygen via [place]. Unable to wean yesterday. Nurse to document if will need short term oxygen at discharge. 3. GI-esophagram showed no leak, no hernia. Patient was placed on a dysphagia 1 diet yesterday. All oral medications are to be crushed prior to administration. 4. CBGs 104/109/99. No prior history of diabetes so will stop accu checks and SS  PRN. 5. As discussed with Dr. Kipp Brood, possible discharge later today vs am  Sharalyn Ink [facility] 04/30/2020,7:16 AM [phone]  Overall doing well Tolerating a diet Has had 1 episode of emesis today Needs much more ambulation, and pulmonary toilet Hopefully home tomorrow.  Donjuan Robison Bary Leriche

## 2020-04-30 NOTE — Discharge Instructions (Signed)
Discharge Instructions:  1. Diet: "Baby soft food". Examples: Grits, mashed potatoes, scrambled eggs. Please crush all medications that are taken by mouth.  2. You may shower, please wash incisions daily with soap and water and keep dry.  If you wish to cover wounds with dressing you may do so but please keep clean and change daily.  No tub baths or swimming until incisions have completely healed.  If your incisions become red or develop any drainage please call our office at [phone]  3. No Driving until cleared by [facility] office and you are no longer using narcotic pain medications  4. Fever of 101.5 for at least 24 hours with no source, please contact our office at [phone]  5. Activity- up as tolerated, please walk at least 3 times per day.  Avoid strenuous activity, no lifting, pushing, or pulling with your arms over 8-10 lbs for a minimum of 2 weeks

## 2020-04-30 NOTE — Progress Notes (Signed)
SATURATION QUALIFICATIONS:   Patient Saturations on Room Air at Rest = 93%  Patient Saturations on Room Air while Ambulating = 83%  Patient Saturations on 2 Liters of oxygen while Ambulating = 91%  Patient need O2 2L for activity at this time. HS [employer]

## 2020-04-30 NOTE — Progress Notes (Signed)
Patient vomited once now with brown color emesis. Patient is wearing O2 at home at night time for 2L. Patient need O2 with activity, Will check O2 level when she feels okay. HS [employer]

## 2020-05-01 ENCOUNTER — Inpatient Hospital Stay (HOSPITAL_COMMUNITY): Payer: Medicare PPO

## 2020-05-01 NOTE — Progress Notes (Signed)
Refused to use AK pad, claimed she doesn't need it.

## 2020-05-01 NOTE — Progress Notes (Addendum)
      [address]       [place] [address]             [phone]       3 Days Post-Op Procedure(s) (LRB): XI ROBOTIC ASSISTED PARAESOPHAGEAL HERNIA REPAIR WITH GASTROPEXY (N/A) ESOPHAGOGASTRODUODENOSCOPY (EGD) (N/A)  Subjective: Patient had nausea and vomited once yesterday. Zofran was changed to scheduled and she was started on Reglan. She is eating jello this am. She denies further nausea or vomiting.  Objective: Vital signs in last 24 hours: Temp:  [97.6 F (36.4 C)-98.3 F (36.8 C)] 97.8 F (36.6 C) (01/13 0326) Pulse Rate:  [67-91] 67 (01/13 0326) Cardiac Rhythm: Normal sinus rhythm (01/12 1922) Resp:  [11-20] 16 (01/13 0326) BP: (98-143)/(54-84) 121/54 (01/13 0326) SpO2:  [91 %-98 %] 98 % (01/13 0326)     Intake/Output from previous day: 01/12 0701 - 01/13 0700 In: 220 [P.O.:220] Out: 600 [Urine:400; Emesis/NG output:200]   Physical Exam:  Cardiovascular: RRR Pulmonary: Clear to auscultation on right and some crackles on left Abdomen: Soft, non tender, bowel sounds present. Extremities: No LE edema Wounds: Clean and dry.  No erythema or signs of infection.   Lab Results: CBC: Recent Labs    04/29/20 0023 04/30/20 0030  WBC 5.6 10.2  HGB 9.4* 9.1*  HCT 29.0* 28.3*  PLT 258 223   BMET:  Recent Labs    04/29/20 0023 04/30/20 0030  NA 136 136  K 4.7 3.7  CL 103 102  CO2 23 25  GLUCOSE 127* 108*  BUN 16 14  CREATININE 0.91 0.89  CALCIUM 7.9* 7.7*    PT/INR: No results for input(s): LABPROT, INR in the last 72 hours. ABG:  INR: Will add last result for INR, ABG once components are confirmed Will add last 4 CBG results once components are confirmed  Assessment/Plan:  1. CV - SR. 2.  Pulmonary - On 2 liters of oxygen via [place]. Nurse documented she does desat with ambulation. Will arrange for short term home oxygen. Will check CXR, but likely related to deconditioning. Encourage incentive spirometer. 3. GI-esophagram  showed no leak, no hernia. Patient was placed on a dysphagia 1 diet yesterday. All oral medications are to be crushed prior to administration. 4. Deconditioned;she walked once yesterday. Increase activity as able 5. Possible discharge later today vs am  Donielle M ZimmermanPA-C 05/01/2020,7:03 AM [phone]

## 2020-05-01 NOTE — Progress Notes (Signed)
Discharged home accompanied by husband and son. Belongings taken home

## 2020-05-01 NOTE — Progress Notes (Addendum)
Patient had some nausea earlier this afternoon but no vomiting. She likely will have intermittent nausea so Zofran ODT has been prescribed at discharge. She has not walked yet today. She states she wears oxygen at night and has a concentrator at home. She was instructed to wear oxygen daily for now.As discussed with Dr. Kipp Brood, will discharge.

## 2020-05-01 NOTE — Progress Notes (Signed)
Still feeling nauseous ,reglan and zofran given. Continue to monitor.

## 2020-05-01 NOTE — Plan of Care (Signed)

## 2020-05-07 ENCOUNTER — Other Ambulatory Visit: Payer: Self-pay

## 2020-05-07 NOTE — Patient Outreach (Signed)
[facility]) Care Management  05/07/2020  [name] 04/09/1935 [id number]      EMMI-General Discharge RED ON EMMI ALERT Day # 4 Date: 05/06/2020 Red Alert Reason: "Other questions/problems? Yes"   Outreach attempt #1 to patient. Spoke with patient who reports she is doing fairly well. She voices that she is still weak. She reports that her reflux has been bothering her some this morning. She took her medicine a short while ago and is waiting for it to kick in.Reviewed and addressed red alert. She denies any questions or problems at present. She reports she goes for MD follow up appt on Friday and confirms she has transportation. She reports that her appetite remains decreased. RN CM provided options for patient to try to increase oral and nutritional intake and she voiced understanding. She is aware to seek medical attention for any worsening and/or unresolved sxs. She denies any RN CM needs or concerns at this time. Patient has completed post discharge automated EMMI calls.    Plan: RN CM will close case at this time.   Enzo Montgomery, RN,BSN,CCM [facility] Telephonic Care Management Coordinator Direct Phone: [phone] Toll Free: [phone] Fax: [phone]

## 2020-05-09 ENCOUNTER — Other Ambulatory Visit: Payer: Self-pay

## 2020-05-09 ENCOUNTER — Ambulatory Visit (HOSPITAL_COMMUNITY)
Admission: RE | Admit: 2020-05-09 | Discharge: 2020-05-09 | Disposition: A | Discharge status: Home / Self Care | Payer: Medicare PPO | Source: Ambulatory Visit | Attending: Thoracic Surgery (Cardiothoracic Vascular Surgery) | Admitting: Thoracic Surgery (Cardiothoracic Vascular Surgery)

## 2020-05-09 ENCOUNTER — Other Ambulatory Visit: Payer: Self-pay | Admitting: Thoracic Surgery (Cardiothoracic Vascular Surgery)

## 2020-05-09 ENCOUNTER — Ambulatory Visit: Payer: Medicare PPO | Admitting: Thoracic Surgery (Cardiothoracic Vascular Surgery)

## 2020-05-09 ENCOUNTER — Ambulatory Visit (INDEPENDENT_AMBULATORY_CARE_PROVIDER_SITE_OTHER): Payer: Self-pay | Admitting: Thoracic Surgery (Cardiothoracic Vascular Surgery)

## 2020-05-09 ENCOUNTER — Encounter: Payer: Self-pay | Admitting: Thoracic Surgery (Cardiothoracic Vascular Surgery)

## 2020-05-09 VITALS — BP 140/80 | HR 77 | Temp 97.6°F | Resp 20 | Ht 62.0 in | Wt 172.0 lb

## 2020-05-09 DIAGNOSIS — Z09 Encounter for follow-up examination after completed treatment for conditions other than malignant neoplasm: Secondary | ICD-10-CM

## 2020-05-09 DIAGNOSIS — K449 Diaphragmatic hernia without obstruction or gangrene: Secondary | ICD-10-CM

## 2020-05-09 DIAGNOSIS — Z48815 Encounter for surgical aftercare following surgery on the digestive system: Secondary | ICD-10-CM | POA: Diagnosis not present

## 2020-05-09 MED ORDER — IOHEXOL 300 MG/ML  SOLN
100.0000 mL | Freq: Once | INTRAMUSCULAR | Status: AC | PRN
  Administered 2020-05-09: 100 mL via ORAL

## 2020-05-09 NOTE — Progress Notes (Signed)
      [address]       [place] [address]             [phone]        Farrie L Herbers [place] Medical Record #[id number] Date of Birth: 03-09-1935  Referring: Leighton Ruff, MD Primary Care: Aretta Nip, MD Primary Cardiologist:No primary care provider on file.  Reason for visit:   follow-up  History of Present Illness:     Ms. Danley complains of reflux, but no dysphagia or diet aphasia.  She remains on supplemental oxygen 24 hours a day as opposed to at night.  She also complains of pain at the gastropexy site.  Physical Exam: BP 140/80   Pulse 77   Temp 97.6 F (36.4 C) (Skin)   Resp 20   Ht 5\' 2"  (1.575 m)   Wt 172 lb (78 kg)   SpO2 96% Comment: 2L O2 per [place]  BMI 31.46 kg/m   Alert NAD Incision clean. Abdomen soft, ND Trace peripheral edema       Assessment / Plan:   86 year old female status post robotic assisted paraesophageal hernia repair.  She states that her reflux has returned.  She remains short of breath on oxygen 24 hours a day.  I reviewed the esophagram, and her stomach remains intra-abdominal.  The narrowing at the GE junction is also decreased which indicates that the swelling is improved.  I am concerned that given her age and frailty she will need more assistance at home especially since her husband is of similar presentation.  Ordered home health to assess her for nursing assistance.  Have instructed her to reintroduce normal food into her diet but to continue to crush her medications for now.  I will see her back in clinic in 2 weeks.   Lajuana Matte 05/09/2020 2:27 PM

## 2020-05-12 ENCOUNTER — Other Ambulatory Visit: Payer: Self-pay

## 2020-05-12 DIAGNOSIS — Z9889 Other specified postprocedural states: Secondary | ICD-10-CM

## 2020-05-13 DIAGNOSIS — J9690 Respiratory failure, unspecified, unspecified whether with hypoxia or hypercapnia: Secondary | ICD-10-CM | POA: Diagnosis not present

## 2020-05-13 DIAGNOSIS — R0789 Other chest pain: Secondary | ICD-10-CM | POA: Diagnosis not present

## 2020-05-13 DIAGNOSIS — I2609 Other pulmonary embolism with acute cor pulmonale: Secondary | ICD-10-CM | POA: Diagnosis not present

## 2020-05-13 DIAGNOSIS — J918 Pleural effusion in other conditions classified elsewhere: Secondary | ICD-10-CM | POA: Diagnosis not present

## 2020-05-15 DIAGNOSIS — M1990 Unspecified osteoarthritis, unspecified site: Secondary | ICD-10-CM | POA: Diagnosis not present

## 2020-05-15 DIAGNOSIS — E041 Nontoxic single thyroid nodule: Secondary | ICD-10-CM | POA: Diagnosis not present

## 2020-05-15 DIAGNOSIS — J9621 Acute and chronic respiratory failure with hypoxia: Secondary | ICD-10-CM | POA: Diagnosis not present

## 2020-05-15 DIAGNOSIS — Z48815 Encounter for surgical aftercare following surgery on the digestive system: Secondary | ICD-10-CM | POA: Diagnosis not present

## 2020-05-15 DIAGNOSIS — Z86711 Personal history of pulmonary embolism: Secondary | ICD-10-CM | POA: Diagnosis not present

## 2020-05-15 DIAGNOSIS — I110 Hypertensive heart disease with heart failure: Secondary | ICD-10-CM | POA: Diagnosis not present

## 2020-05-15 DIAGNOSIS — I5032 Chronic diastolic (congestive) heart failure: Secondary | ICD-10-CM | POA: Diagnosis not present

## 2020-05-15 DIAGNOSIS — K219 Gastro-esophageal reflux disease without esophagitis: Secondary | ICD-10-CM | POA: Diagnosis not present

## 2020-05-22 DIAGNOSIS — J9621 Acute and chronic respiratory failure with hypoxia: Secondary | ICD-10-CM | POA: Diagnosis not present

## 2020-05-22 DIAGNOSIS — I110 Hypertensive heart disease with heart failure: Secondary | ICD-10-CM | POA: Diagnosis not present

## 2020-05-22 DIAGNOSIS — Z86711 Personal history of pulmonary embolism: Secondary | ICD-10-CM | POA: Diagnosis not present

## 2020-05-22 DIAGNOSIS — E041 Nontoxic single thyroid nodule: Secondary | ICD-10-CM | POA: Diagnosis not present

## 2020-05-22 DIAGNOSIS — I5032 Chronic diastolic (congestive) heart failure: Secondary | ICD-10-CM | POA: Diagnosis not present

## 2020-05-22 DIAGNOSIS — M1990 Unspecified osteoarthritis, unspecified site: Secondary | ICD-10-CM | POA: Diagnosis not present

## 2020-05-22 DIAGNOSIS — K219 Gastro-esophageal reflux disease without esophagitis: Secondary | ICD-10-CM | POA: Diagnosis not present

## 2020-05-22 DIAGNOSIS — Z48815 Encounter for surgical aftercare following surgery on the digestive system: Secondary | ICD-10-CM | POA: Diagnosis not present

## 2020-05-23 ENCOUNTER — Ambulatory Visit (INDEPENDENT_AMBULATORY_CARE_PROVIDER_SITE_OTHER): Payer: Self-pay | Admitting: Thoracic Surgery (Cardiothoracic Vascular Surgery)

## 2020-05-23 ENCOUNTER — Other Ambulatory Visit: Payer: Self-pay

## 2020-05-23 ENCOUNTER — Encounter: Payer: Self-pay | Admitting: Thoracic Surgery (Cardiothoracic Vascular Surgery)

## 2020-05-23 DIAGNOSIS — Z8719 Personal history of other diseases of the digestive system: Secondary | ICD-10-CM

## 2020-05-23 DIAGNOSIS — Z9889 Other specified postprocedural states: Secondary | ICD-10-CM

## 2020-05-23 HISTORY — DX: Personal history of other diseases of the digestive system: Z87.19

## 2020-05-23 HISTORY — DX: Other specified postprocedural states: Z98.890

## 2020-05-23 MED ORDER — METOCLOPRAMIDE HCL 5 MG PO TABS: 5.0000 mg | ORAL_TABLET | Freq: Three times a day (TID) | ORAL | 1 refills | Status: DC

## 2020-05-23 NOTE — Progress Notes (Signed)
      [address]       [place] [address]             [phone]        [name] [place] Medical Record #[id number] Date of Birth: 17-Aug-1934  Referring: Leighton Ruff, MD Primary Care: Aretta Nip, MD Primary Cardiologist:No primary care provider on file.  Reason for visit:   follow-up  History of Present Illness:     [name] comes in for her 1 month follow-up appointment.  Overall she is doing well.  She denies any dysphagia or odynophagia.  She does complain of some indigestion and nausea after meals.  She has had some residual reflux as well.  In regards to her bowel movements, she does occasionally have some diarrhea but also has some constipation.  Physical Exam: BP 138/75 (BP Location: Right Arm, Patient Position: Sitting, Cuff Size: Normal)   Pulse 75   Temp 98.7 F (37.1 C) (Skin)   Resp 18   Ht 5\' 2"  (1.575 m)   Wt 163 lb 6.4 oz (74.1 kg)   SpO2 96% Comment: RA  BMI 29.89 kg/m   Alert NAD Incision incisions are well-healed.   Abdomen soft, ND No peripheral edema       Assessment / Plan:   86 year old female status post paraesophageal hernia repair.  She had a very large paraesophageal hernia with significant esophageal dysmotility.  Based on her symptoms I think that she also has some component of gastric dysmotility.  I have given her a prescription for Reglan, and discussed the risks and benefits of using this medication.  She is agreeable to give this a try for the next month.  She is also able to advance her diet, no longer needs to crush her medications.  She will follow-up in a virtual visit for assessment of her nausea.   Lajuana Matte 05/23/2020 3:41 PM

## 2020-05-29 DIAGNOSIS — M1990 Unspecified osteoarthritis, unspecified site: Secondary | ICD-10-CM | POA: Diagnosis not present

## 2020-05-29 DIAGNOSIS — Z86711 Personal history of pulmonary embolism: Secondary | ICD-10-CM | POA: Diagnosis not present

## 2020-05-29 DIAGNOSIS — I5032 Chronic diastolic (congestive) heart failure: Secondary | ICD-10-CM | POA: Diagnosis not present

## 2020-05-29 DIAGNOSIS — E041 Nontoxic single thyroid nodule: Secondary | ICD-10-CM | POA: Diagnosis not present

## 2020-05-29 DIAGNOSIS — J9621 Acute and chronic respiratory failure with hypoxia: Secondary | ICD-10-CM | POA: Diagnosis not present

## 2020-05-29 DIAGNOSIS — K219 Gastro-esophageal reflux disease without esophagitis: Secondary | ICD-10-CM | POA: Diagnosis not present

## 2020-05-29 DIAGNOSIS — I110 Hypertensive heart disease with heart failure: Secondary | ICD-10-CM | POA: Diagnosis not present

## 2020-05-29 DIAGNOSIS — Z48815 Encounter for surgical aftercare following surgery on the digestive system: Secondary | ICD-10-CM | POA: Diagnosis not present

## 2020-06-02 ENCOUNTER — Telehealth: Payer: Self-pay

## 2020-06-02 NOTE — Telephone Encounter (Signed)
Patient contacted the office with concerns about bowel movements.  She stated that she has not had a BM in a week and is concerned.  She is not eating very much as well, she stated. She is s/p robotic paraesophageal hernia repair with Dr. Kipp Brood 04/28/20.  She is requesting to take something different than Miralax as she gets very nauseous, asked if she could take colace.  Advised that she could take it as needed and as directed on bottle.  She acknowledged receipt.  She also stated that she is having trouble being nauseated and dizzy at times.  She believes that this is due to not eating and drinking much.  Also, advised patient that she should see her PCP after discharge from the hospital.  She acknowledged receipt.

## 2020-06-03 DIAGNOSIS — R7303 Prediabetes: Secondary | ICD-10-CM | POA: Diagnosis not present

## 2020-06-03 DIAGNOSIS — I10 Essential (primary) hypertension: Secondary | ICD-10-CM | POA: Diagnosis not present

## 2020-06-03 DIAGNOSIS — E559 Vitamin D deficiency, unspecified: Secondary | ICD-10-CM | POA: Diagnosis not present

## 2020-06-03 DIAGNOSIS — R140 Abdominal distension (gaseous): Secondary | ICD-10-CM | POA: Diagnosis not present

## 2020-06-03 DIAGNOSIS — K440 Diaphragmatic hernia with obstruction, without gangrene: Secondary | ICD-10-CM | POA: Diagnosis not present

## 2020-06-03 DIAGNOSIS — K2100 Gastro-esophageal reflux disease with esophagitis, without bleeding: Secondary | ICD-10-CM | POA: Diagnosis not present

## 2020-06-03 DIAGNOSIS — K5901 Slow transit constipation: Secondary | ICD-10-CM | POA: Diagnosis not present

## 2020-06-03 DIAGNOSIS — R112 Nausea with vomiting, unspecified: Secondary | ICD-10-CM | POA: Diagnosis not present

## 2020-06-04 DIAGNOSIS — M1990 Unspecified osteoarthritis, unspecified site: Secondary | ICD-10-CM | POA: Diagnosis not present

## 2020-06-04 DIAGNOSIS — K219 Gastro-esophageal reflux disease without esophagitis: Secondary | ICD-10-CM | POA: Diagnosis not present

## 2020-06-04 DIAGNOSIS — I5032 Chronic diastolic (congestive) heart failure: Secondary | ICD-10-CM | POA: Diagnosis not present

## 2020-06-04 DIAGNOSIS — J9621 Acute and chronic respiratory failure with hypoxia: Secondary | ICD-10-CM | POA: Diagnosis not present

## 2020-06-04 DIAGNOSIS — E041 Nontoxic single thyroid nodule: Secondary | ICD-10-CM | POA: Diagnosis not present

## 2020-06-04 DIAGNOSIS — Z48815 Encounter for surgical aftercare following surgery on the digestive system: Secondary | ICD-10-CM | POA: Diagnosis not present

## 2020-06-04 DIAGNOSIS — Z86711 Personal history of pulmonary embolism: Secondary | ICD-10-CM | POA: Diagnosis not present

## 2020-06-04 DIAGNOSIS — I110 Hypertensive heart disease with heart failure: Secondary | ICD-10-CM | POA: Diagnosis not present

## 2020-06-06 DIAGNOSIS — R112 Nausea with vomiting, unspecified: Secondary | ICD-10-CM | POA: Diagnosis not present

## 2020-06-06 DIAGNOSIS — I10 Essential (primary) hypertension: Secondary | ICD-10-CM | POA: Diagnosis not present

## 2020-06-06 DIAGNOSIS — K440 Diaphragmatic hernia with obstruction, without gangrene: Secondary | ICD-10-CM | POA: Diagnosis not present

## 2020-06-06 DIAGNOSIS — E559 Vitamin D deficiency, unspecified: Secondary | ICD-10-CM | POA: Diagnosis not present

## 2020-06-06 DIAGNOSIS — R7303 Prediabetes: Secondary | ICD-10-CM | POA: Diagnosis not present

## 2020-06-06 DIAGNOSIS — K5901 Slow transit constipation: Secondary | ICD-10-CM | POA: Diagnosis not present

## 2020-06-06 DIAGNOSIS — D509 Iron deficiency anemia, unspecified: Secondary | ICD-10-CM | POA: Diagnosis not present

## 2020-06-06 DIAGNOSIS — R140 Abdominal distension (gaseous): Secondary | ICD-10-CM | POA: Diagnosis not present

## 2020-06-06 DIAGNOSIS — K2100 Gastro-esophageal reflux disease with esophagitis, without bleeding: Secondary | ICD-10-CM | POA: Diagnosis not present

## 2020-06-09 DIAGNOSIS — Z86711 Personal history of pulmonary embolism: Secondary | ICD-10-CM | POA: Diagnosis not present

## 2020-06-09 DIAGNOSIS — K219 Gastro-esophageal reflux disease without esophagitis: Secondary | ICD-10-CM | POA: Diagnosis not present

## 2020-06-09 DIAGNOSIS — I5032 Chronic diastolic (congestive) heart failure: Secondary | ICD-10-CM | POA: Diagnosis not present

## 2020-06-09 DIAGNOSIS — E041 Nontoxic single thyroid nodule: Secondary | ICD-10-CM | POA: Diagnosis not present

## 2020-06-09 DIAGNOSIS — Z48815 Encounter for surgical aftercare following surgery on the digestive system: Secondary | ICD-10-CM | POA: Diagnosis not present

## 2020-06-09 DIAGNOSIS — M1990 Unspecified osteoarthritis, unspecified site: Secondary | ICD-10-CM | POA: Diagnosis not present

## 2020-06-09 DIAGNOSIS — J9621 Acute and chronic respiratory failure with hypoxia: Secondary | ICD-10-CM | POA: Diagnosis not present

## 2020-06-09 DIAGNOSIS — I110 Hypertensive heart disease with heart failure: Secondary | ICD-10-CM | POA: Diagnosis not present

## 2020-06-10 DIAGNOSIS — I110 Hypertensive heart disease with heart failure: Secondary | ICD-10-CM | POA: Diagnosis not present

## 2020-06-10 DIAGNOSIS — M1990 Unspecified osteoarthritis, unspecified site: Secondary | ICD-10-CM | POA: Diagnosis not present

## 2020-06-10 DIAGNOSIS — K219 Gastro-esophageal reflux disease without esophagitis: Secondary | ICD-10-CM | POA: Diagnosis not present

## 2020-06-10 DIAGNOSIS — Z86711 Personal history of pulmonary embolism: Secondary | ICD-10-CM | POA: Diagnosis not present

## 2020-06-10 DIAGNOSIS — Z48815 Encounter for surgical aftercare following surgery on the digestive system: Secondary | ICD-10-CM | POA: Diagnosis not present

## 2020-06-10 DIAGNOSIS — J9621 Acute and chronic respiratory failure with hypoxia: Secondary | ICD-10-CM | POA: Diagnosis not present

## 2020-06-10 DIAGNOSIS — I5032 Chronic diastolic (congestive) heart failure: Secondary | ICD-10-CM | POA: Diagnosis not present

## 2020-06-10 DIAGNOSIS — E041 Nontoxic single thyroid nodule: Secondary | ICD-10-CM | POA: Diagnosis not present

## 2020-06-13 DIAGNOSIS — E559 Vitamin D deficiency, unspecified: Secondary | ICD-10-CM | POA: Diagnosis not present

## 2020-06-13 DIAGNOSIS — R112 Nausea with vomiting, unspecified: Secondary | ICD-10-CM | POA: Diagnosis not present

## 2020-06-13 DIAGNOSIS — I10 Essential (primary) hypertension: Secondary | ICD-10-CM | POA: Diagnosis not present

## 2020-06-13 DIAGNOSIS — R0789 Other chest pain: Secondary | ICD-10-CM | POA: Diagnosis not present

## 2020-06-13 DIAGNOSIS — I2609 Other pulmonary embolism with acute cor pulmonale: Secondary | ICD-10-CM | POA: Diagnosis not present

## 2020-06-13 DIAGNOSIS — J918 Pleural effusion in other conditions classified elsewhere: Secondary | ICD-10-CM | POA: Diagnosis not present

## 2020-06-13 DIAGNOSIS — K5901 Slow transit constipation: Secondary | ICD-10-CM | POA: Diagnosis not present

## 2020-06-13 DIAGNOSIS — K2100 Gastro-esophageal reflux disease with esophagitis, without bleeding: Secondary | ICD-10-CM | POA: Diagnosis not present

## 2020-06-13 DIAGNOSIS — D509 Iron deficiency anemia, unspecified: Secondary | ICD-10-CM | POA: Diagnosis not present

## 2020-06-13 DIAGNOSIS — K440 Diaphragmatic hernia with obstruction, without gangrene: Secondary | ICD-10-CM | POA: Diagnosis not present

## 2020-06-13 DIAGNOSIS — R140 Abdominal distension (gaseous): Secondary | ICD-10-CM | POA: Diagnosis not present

## 2020-06-13 DIAGNOSIS — J9690 Respiratory failure, unspecified, unspecified whether with hypoxia or hypercapnia: Secondary | ICD-10-CM | POA: Diagnosis not present

## 2020-06-13 DIAGNOSIS — R7303 Prediabetes: Secondary | ICD-10-CM | POA: Diagnosis not present

## 2020-06-14 DIAGNOSIS — J9621 Acute and chronic respiratory failure with hypoxia: Secondary | ICD-10-CM | POA: Diagnosis not present

## 2020-06-14 DIAGNOSIS — M1990 Unspecified osteoarthritis, unspecified site: Secondary | ICD-10-CM | POA: Diagnosis not present

## 2020-06-14 DIAGNOSIS — K219 Gastro-esophageal reflux disease without esophagitis: Secondary | ICD-10-CM | POA: Diagnosis not present

## 2020-06-14 DIAGNOSIS — I5032 Chronic diastolic (congestive) heart failure: Secondary | ICD-10-CM | POA: Diagnosis not present

## 2020-06-14 DIAGNOSIS — Z48815 Encounter for surgical aftercare following surgery on the digestive system: Secondary | ICD-10-CM | POA: Diagnosis not present

## 2020-06-14 DIAGNOSIS — Z86711 Personal history of pulmonary embolism: Secondary | ICD-10-CM | POA: Diagnosis not present

## 2020-06-14 DIAGNOSIS — E041 Nontoxic single thyroid nodule: Secondary | ICD-10-CM | POA: Diagnosis not present

## 2020-06-14 DIAGNOSIS — I110 Hypertensive heart disease with heart failure: Secondary | ICD-10-CM | POA: Diagnosis not present

## 2020-06-17 ENCOUNTER — Telehealth: Payer: Self-pay | Admitting: Gastroenterology

## 2020-06-17 NOTE — Telephone Encounter (Signed)
Patient reports that she is having GERD.  Nothing tastes good to her and she is losing weight.  She is having a lot of problems with excessive GERD since her paraesophageal repair in January.  She was started on Reglan by surgery.  This has not helped.  She is requesting an earlier appointment with Dr. Fuller Plan.  She will come in on Monday at 3:40 3/7

## 2020-06-18 DIAGNOSIS — Z86711 Personal history of pulmonary embolism: Secondary | ICD-10-CM | POA: Diagnosis not present

## 2020-06-18 DIAGNOSIS — M1990 Unspecified osteoarthritis, unspecified site: Secondary | ICD-10-CM | POA: Diagnosis not present

## 2020-06-18 DIAGNOSIS — K219 Gastro-esophageal reflux disease without esophagitis: Secondary | ICD-10-CM | POA: Diagnosis not present

## 2020-06-18 DIAGNOSIS — I110 Hypertensive heart disease with heart failure: Secondary | ICD-10-CM | POA: Diagnosis not present

## 2020-06-18 DIAGNOSIS — Z48815 Encounter for surgical aftercare following surgery on the digestive system: Secondary | ICD-10-CM | POA: Diagnosis not present

## 2020-06-18 DIAGNOSIS — I5032 Chronic diastolic (congestive) heart failure: Secondary | ICD-10-CM | POA: Diagnosis not present

## 2020-06-18 DIAGNOSIS — E041 Nontoxic single thyroid nodule: Secondary | ICD-10-CM | POA: Diagnosis not present

## 2020-06-18 DIAGNOSIS — J9621 Acute and chronic respiratory failure with hypoxia: Secondary | ICD-10-CM | POA: Diagnosis not present

## 2020-06-19 DIAGNOSIS — J9621 Acute and chronic respiratory failure with hypoxia: Secondary | ICD-10-CM | POA: Diagnosis not present

## 2020-06-19 DIAGNOSIS — K219 Gastro-esophageal reflux disease without esophagitis: Secondary | ICD-10-CM | POA: Diagnosis not present

## 2020-06-19 DIAGNOSIS — I110 Hypertensive heart disease with heart failure: Secondary | ICD-10-CM | POA: Diagnosis not present

## 2020-06-19 DIAGNOSIS — Z86711 Personal history of pulmonary embolism: Secondary | ICD-10-CM | POA: Diagnosis not present

## 2020-06-19 DIAGNOSIS — Z48815 Encounter for surgical aftercare following surgery on the digestive system: Secondary | ICD-10-CM | POA: Diagnosis not present

## 2020-06-19 DIAGNOSIS — M1990 Unspecified osteoarthritis, unspecified site: Secondary | ICD-10-CM | POA: Diagnosis not present

## 2020-06-19 DIAGNOSIS — I5032 Chronic diastolic (congestive) heart failure: Secondary | ICD-10-CM | POA: Diagnosis not present

## 2020-06-19 DIAGNOSIS — E041 Nontoxic single thyroid nodule: Secondary | ICD-10-CM | POA: Diagnosis not present

## 2020-06-20 ENCOUNTER — Telehealth (INDEPENDENT_AMBULATORY_CARE_PROVIDER_SITE_OTHER): Payer: Self-pay | Admitting: Thoracic Surgery (Cardiothoracic Vascular Surgery)

## 2020-06-20 ENCOUNTER — Encounter: Payer: Self-pay | Admitting: Thoracic Surgery (Cardiothoracic Vascular Surgery)

## 2020-06-20 ENCOUNTER — Other Ambulatory Visit: Payer: Self-pay

## 2020-06-20 ENCOUNTER — Other Ambulatory Visit: Payer: Self-pay | Admitting: Thoracic Surgery (Cardiothoracic Vascular Surgery)

## 2020-06-20 DIAGNOSIS — Z8719 Personal history of other diseases of the digestive system: Secondary | ICD-10-CM

## 2020-06-20 NOTE — Progress Notes (Signed)
     [address]       [place] [address]             [phone]       Patient: Home Provider: Office Consent for Telemedicine visit obtained.  Today's visit was completed via a real-time telehealth (see specific modality noted below). The patient/authorized person provided oral consent at the time of the visit to engage in a telemedicine encounter with the present provider at [facility]. The patient/authorized person was informed of the potential benefits, limitations, and risks of telemedicine. The patient/authorized person expressed understanding that the laws that protect confidentiality also apply to telemedicine. The patient/authorized person acknowledged understanding that telemedicine does not provide emergency services and that he or she would need to call 911 or proceed to the nearest hospital for help if such a need arose.  . Total time spent in the clinical discussion 5 minutes. . Telehealth Modality: Phone visit (audio only)  I had a telephone visit with Mr. Neuwirth.  She complains of ongoing reflux, and some occasional dysphagia.  She has been taking the Reglan without much improvement in her symptoms.  Her primary care physician has given her an appointment to meet with Dr. Fuller Plan with gastroenterology on Monday.  I have ordered a esophagram for comparison to the postoperative films, and evaluation of early recurrence.  This will be her second esophagram and her symptoms are not much changed.   I will see her in clinic in 1 week.

## 2020-06-23 ENCOUNTER — Ambulatory Visit (INDEPENDENT_AMBULATORY_CARE_PROVIDER_SITE_OTHER): Payer: Medicare PPO | Admitting: Gastroenterology

## 2020-06-23 ENCOUNTER — Other Ambulatory Visit: Payer: Medicare PPO

## 2020-06-23 ENCOUNTER — Encounter: Payer: Self-pay | Admitting: Gastroenterology

## 2020-06-23 VITALS — BP 120/62 | HR 60 | Ht 60.25 in | Wt 157.1 lb

## 2020-06-23 DIAGNOSIS — K5900 Constipation, unspecified: Secondary | ICD-10-CM

## 2020-06-23 DIAGNOSIS — R634 Abnormal weight loss: Secondary | ICD-10-CM

## 2020-06-23 DIAGNOSIS — R112 Nausea with vomiting, unspecified: Secondary | ICD-10-CM | POA: Diagnosis not present

## 2020-06-23 DIAGNOSIS — R1310 Dysphagia, unspecified: Secondary | ICD-10-CM

## 2020-06-23 NOTE — Patient Instructions (Signed)
Keep your appointment with your surgeon.   Eat 5-6 smaller meals a daily, no solid foods. Mostly liquids.  Continue premier protein shakes in between meals .  Thank you for choosing me and [facility].  Pricilla Riffle. Dagoberto Ligas., MD., [facility] Regal

## 2020-06-23 NOTE — Progress Notes (Signed)
    History of Present Illness: [name] S/P paraesophageal hernia repair, robotic assisted laparotomy with chronic organoaxial volvulus on 04/28/2020 by Dr. Kipp Brood.  She is accompanied by her daughter. Since surgery she has had problems with nausea, vomiting, difficulty swallowing, poor appetite, 20 pound weight loss, intermittent dizziness, constipation with intermittent diarrhea.  CBC on February 15 showed a hemoglobin of 10.6 otherwise unremarkable. CMP on February 15 showed t bili 1.6, alk phos 134 glucose 107 otherwise normal.  She has been evaluated by her PCP Dr. Jacelyn Grip.  Promethazine has been effective to control nausea vomiting on some occasions but not others.  She has an appointment with Dr. Kipp Brood on Friday.  Barium esophagram is scheduled on Wednesday.  Current Medications, Allergies, Past Medical History, Past Surgical History, Family History and Social History were reviewed in [employer] record.   Physical Exam: General: Well developed, well nourished, no acute distress Head: Normocephalic and atraumatic Eyes: Sclerae anicteric, EOMI Ears: Normal auditory acuity Mouth: Not examined, mask on during Covid-19 pandemic Lungs: Clear throughout to auscultation Heart: Regular rate and rhythm; no murmurs, rubs or bruits Abdomen: Soft, non tender and non distended. No masses, hepatosplenomegaly or hernias noted. Normal Bowel sounds Rectal: Not done Musculoskeletal: Symmetrical with no gross deformities  Pulses:  Normal pulses noted Extremities: No clubbing, cyanosis, edema or deformities noted Neurological: Alert oriented x 4, grossly nonfocal Psychological:  Alert and cooperative. Normal mood and affect   Assessment and Recommendations:  1. S/P paraesophageal hernia repair and organoaxial volvulus repair, robotic assisted laparotomy on 04/28/2020 with persistent nausea, vomiting, dysphagia, poor appetite, 20 pound weight loss, and  intermittent dizziness.  Maintain a full liquid to soft foods diet.  Lactose-free nutritional supplements between meals.  Continue promethazine 6.25 mg, 12.5 mg or 25 mg p.o. twice daily as needed for nausea, vomiting.  Continue pantoprazole 40 mg daily.  Continue metoclopramide 5 mg p.o. 3 times daily before meals for now however would discontinue this soon.  Follow-up with Dr. Kipp Brood as planned for further evaluation and management. Follow up with PCP as planned. GI follow up prn.  2.  Chronic constipation.  Continue Colace 2/day. Add Dulcolax tablets daily if Colace is not effective. Mg citrate or Mg oxide capsules if not effective.  Fleets enema every 3-5 days if no results with oral laxatives.  Follow up with PCP.   3. CRC screening.  Cologuard negative in 2018.  No plans for future CRC screening due to age and comorbidities.    4. Normocytic anemia. Evaluation per PCP.   5. Chronic respiratory failure on home nocturnal O2.

## 2020-06-24 DIAGNOSIS — I5032 Chronic diastolic (congestive) heart failure: Secondary | ICD-10-CM | POA: Diagnosis not present

## 2020-06-24 DIAGNOSIS — I110 Hypertensive heart disease with heart failure: Secondary | ICD-10-CM | POA: Diagnosis not present

## 2020-06-24 DIAGNOSIS — Z48815 Encounter for surgical aftercare following surgery on the digestive system: Secondary | ICD-10-CM | POA: Diagnosis not present

## 2020-06-24 DIAGNOSIS — J9621 Acute and chronic respiratory failure with hypoxia: Secondary | ICD-10-CM | POA: Diagnosis not present

## 2020-06-24 DIAGNOSIS — Z86711 Personal history of pulmonary embolism: Secondary | ICD-10-CM | POA: Diagnosis not present

## 2020-06-24 DIAGNOSIS — E041 Nontoxic single thyroid nodule: Secondary | ICD-10-CM | POA: Diagnosis not present

## 2020-06-24 DIAGNOSIS — M1990 Unspecified osteoarthritis, unspecified site: Secondary | ICD-10-CM | POA: Diagnosis not present

## 2020-06-24 DIAGNOSIS — K219 Gastro-esophageal reflux disease without esophagitis: Secondary | ICD-10-CM | POA: Diagnosis not present

## 2020-06-25 ENCOUNTER — Other Ambulatory Visit: Payer: Self-pay

## 2020-06-25 ENCOUNTER — Other Ambulatory Visit: Payer: Self-pay | Admitting: Thoracic Surgery (Cardiothoracic Vascular Surgery)

## 2020-06-25 ENCOUNTER — Ambulatory Visit
Admission: RE | Admit: 2020-06-25 | Discharge: 2020-06-25 | Disposition: A | Discharge status: Home / Self Care | Payer: Medicare PPO | Source: Ambulatory Visit | Attending: Thoracic Surgery (Cardiothoracic Vascular Surgery) | Admitting: Thoracic Surgery (Cardiothoracic Vascular Surgery)

## 2020-06-25 DIAGNOSIS — Z9889 Other specified postprocedural states: Secondary | ICD-10-CM

## 2020-06-25 DIAGNOSIS — K219 Gastro-esophageal reflux disease without esophagitis: Secondary | ICD-10-CM | POA: Diagnosis not present

## 2020-06-27 ENCOUNTER — Encounter: Payer: Self-pay | Admitting: Thoracic Surgery (Cardiothoracic Vascular Surgery)

## 2020-06-27 ENCOUNTER — Ambulatory Visit (INDEPENDENT_AMBULATORY_CARE_PROVIDER_SITE_OTHER): Payer: Self-pay | Admitting: Thoracic Surgery (Cardiothoracic Vascular Surgery)

## 2020-06-27 ENCOUNTER — Other Ambulatory Visit: Payer: Self-pay

## 2020-06-27 VITALS — BP 150/73 | HR 64 | Resp 20 | Ht 60.0 in | Wt 160.0 lb

## 2020-06-27 DIAGNOSIS — Z8719 Personal history of other diseases of the digestive system: Secondary | ICD-10-CM

## 2020-06-27 DIAGNOSIS — Z9889 Other specified postprocedural states: Secondary | ICD-10-CM

## 2020-06-27 NOTE — Progress Notes (Signed)
     [address]       ,[place] [address]             [phone]       Mrs. Liebig comes in for another follow-up appointment.  Since her last visit she has had an improvement in her eating.  She continues to have some reflux but her dysphagia is much improved as well as her abdominal pain.  She is able to tolerate.  She did undergo an esophagram which did not demonstrate a recurrence in her hiatal hernia.  It did however show significant reflux.  Unfortunately she did not undergo fundoplication at the time of surgery due to concern for esophageal dysmotility.  She remains on the Reglan and this appears to be helping with feeding.  I will see see her again as a virtual visit in 9month to continue to assess her ongoing recovery.  Mayanna Garlitz Bary Leriche

## 2020-07-02 DIAGNOSIS — Z86711 Personal history of pulmonary embolism: Secondary | ICD-10-CM | POA: Diagnosis not present

## 2020-07-02 DIAGNOSIS — K219 Gastro-esophageal reflux disease without esophagitis: Secondary | ICD-10-CM | POA: Diagnosis not present

## 2020-07-02 DIAGNOSIS — E041 Nontoxic single thyroid nodule: Secondary | ICD-10-CM | POA: Diagnosis not present

## 2020-07-02 DIAGNOSIS — I110 Hypertensive heart disease with heart failure: Secondary | ICD-10-CM | POA: Diagnosis not present

## 2020-07-02 DIAGNOSIS — Z48815 Encounter for surgical aftercare following surgery on the digestive system: Secondary | ICD-10-CM | POA: Diagnosis not present

## 2020-07-02 DIAGNOSIS — I5032 Chronic diastolic (congestive) heart failure: Secondary | ICD-10-CM | POA: Diagnosis not present

## 2020-07-02 DIAGNOSIS — J9621 Acute and chronic respiratory failure with hypoxia: Secondary | ICD-10-CM | POA: Diagnosis not present

## 2020-07-02 DIAGNOSIS — M1990 Unspecified osteoarthritis, unspecified site: Secondary | ICD-10-CM | POA: Diagnosis not present

## 2020-07-03 DIAGNOSIS — M1990 Unspecified osteoarthritis, unspecified site: Secondary | ICD-10-CM | POA: Diagnosis not present

## 2020-07-03 DIAGNOSIS — K219 Gastro-esophageal reflux disease without esophagitis: Secondary | ICD-10-CM | POA: Diagnosis not present

## 2020-07-03 DIAGNOSIS — Z86711 Personal history of pulmonary embolism: Secondary | ICD-10-CM | POA: Diagnosis not present

## 2020-07-03 DIAGNOSIS — J9621 Acute and chronic respiratory failure with hypoxia: Secondary | ICD-10-CM | POA: Diagnosis not present

## 2020-07-03 DIAGNOSIS — I110 Hypertensive heart disease with heart failure: Secondary | ICD-10-CM | POA: Diagnosis not present

## 2020-07-03 DIAGNOSIS — E041 Nontoxic single thyroid nodule: Secondary | ICD-10-CM | POA: Diagnosis not present

## 2020-07-03 DIAGNOSIS — Z48815 Encounter for surgical aftercare following surgery on the digestive system: Secondary | ICD-10-CM | POA: Diagnosis not present

## 2020-07-03 DIAGNOSIS — I5032 Chronic diastolic (congestive) heart failure: Secondary | ICD-10-CM | POA: Diagnosis not present

## 2020-07-04 DIAGNOSIS — D649 Anemia, unspecified: Secondary | ICD-10-CM | POA: Diagnosis not present

## 2020-07-04 DIAGNOSIS — R600 Localized edema: Secondary | ICD-10-CM | POA: Diagnosis not present

## 2020-07-04 DIAGNOSIS — G4734 Idiopathic sleep related nonobstructive alveolar hypoventilation: Secondary | ICD-10-CM | POA: Diagnosis not present

## 2020-07-04 DIAGNOSIS — I872 Venous insufficiency (chronic) (peripheral): Secondary | ICD-10-CM | POA: Diagnosis not present

## 2020-07-04 DIAGNOSIS — Z86711 Personal history of pulmonary embolism: Secondary | ICD-10-CM | POA: Diagnosis not present

## 2020-07-07 ENCOUNTER — Telehealth: Payer: Self-pay | Admitting: Pulmonary Disease

## 2020-07-07 ENCOUNTER — Ambulatory Visit: Payer: Medicare PPO

## 2020-07-07 DIAGNOSIS — J90 Pleural effusion, not elsewhere classified: Secondary | ICD-10-CM
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
## 2020-07-07 NOTE — Telephone Encounter (Signed)
Left message for patient to call back  

## 2020-07-07 NOTE — Telephone Encounter (Signed)
Yes she needs to be seen with CXR.   Cc'd: Dr. Kipp Brood as an FYI   Thanks  Garner Nash, [facility] 07/07/2020 2:28 PM

## 2020-07-07 NOTE — Telephone Encounter (Signed)
Called spoke with patient.  She was seen at her primary care doctors office and they said she had fluid on her lungs prior to the hernia surgery by Dr. Kipp Brood. And recently had imaging that still showed fluid. PCP is wondering if it is more fluid than originally.  Patient wants to know does she need to have an appointment with [place] and if she can it be with a NP as next available for Dr. Valeta Harms is May   Sending to Dr. Valeta Harms for further recommendations.

## 2020-07-07 NOTE — Telephone Encounter (Signed)
Ov scheduled for 07/08/2020 at 11:00. CXR has been ordered. Patient aware to arrive 40min prior to ov for CXR.  Nothing further is needed at this time.

## 2020-07-07 NOTE — Telephone Encounter (Signed)
Patient is returning phone call. Patient phone number is [phone] h or [phone] c.

## 2020-07-08 ENCOUNTER — Encounter: Payer: Self-pay | Admitting: Primary Care

## 2020-07-08 ENCOUNTER — Ambulatory Visit (INDEPENDENT_AMBULATORY_CARE_PROVIDER_SITE_OTHER): Payer: Medicare PPO

## 2020-07-08 ENCOUNTER — Ambulatory Visit: Payer: Medicare PPO | Admitting: Primary Care

## 2020-07-08 ENCOUNTER — Other Ambulatory Visit: Payer: Self-pay

## 2020-07-08 VITALS — BP 136/80 | HR 56 | Temp 97.6°F | Ht 61.0 in | Wt 164.0 lb

## 2020-07-08 DIAGNOSIS — J9811 Atelectasis: Secondary | ICD-10-CM | POA: Diagnosis not present

## 2020-07-08 DIAGNOSIS — J90 Pleural effusion, not elsewhere classified: Secondary | ICD-10-CM | POA: Diagnosis not present

## 2020-07-08 LAB — BASIC METABOLIC PANEL
BUN: 13 mg/dL (ref 6–23)
CO2: 27 mEq/L (ref 19–32)
Calcium: 9.1 mg/dL (ref 8.4–10.5)
Chloride: 103 mEq/L (ref 96–112)
Creatinine, Ser: 0.81 mg/dL (ref 0.40–1.20)
GFR: 66.92 mL/min (ref >60.00)
Glucose, Bld: 122 mg/dL — ABNORMAL HIGH (ref 70–99)
Potassium: 4 mEq/L (ref 3.5–5.1)
Sodium: 139 mEq/L (ref 135–145)

## 2020-07-08 LAB — BRAIN NATRIURETIC PEPTIDE: Pro B Natriuretic peptide (BNP): 350 pg/mL — ABNORMAL HIGH (ref 0.0–100.0)

## 2020-07-08 NOTE — Assessment & Plan Note (Addendum)
-   Shortness of breath is baseline, slightly worsened. No improvement in breathing after paraesophageal hernia repair in January 2022. CXR today showed small bilateral pleural effusions unchanged from the prior exam in January 2021. She has history of grade 1 diastolic dysfunction. Reports increased leg swelling over the last week. We will check BMET and BNP. She may benefit from starting an oral diuretic.

## 2020-07-08 NOTE — Progress Notes (Signed)
Please let patient know her BNP (fluid level was elevated). Reviewed with Dr. Valeta Harms and he agrees with diuretic. Please send in order for lasix 20mg  qd x 5 days; then as needed for weight gain or leg swelling #20 tabs. Needs repeat BMET/BNP in 2-4 weeks.

## 2020-07-08 NOTE — Progress Notes (Signed)
Ok great thanks. BLI

## 2020-07-08 NOTE — Patient Instructions (Signed)
CXR showed small bilateral pleural effusions unchanged from the prior exam in January  Echocardiogram showed normal cardiac output, impaired left ventricle relaxation which can cause fluid back up in lower extremities (legs) and/ or lungs  We may try a diuretic after we get labs back, my nurse will call you with recommendations   Orders: Labs today (BMET, BNP)  Follow-up: 6-12 weeks with Dr. Valeta Harms    Heart Failure, Diagnosis  Heart failure means that your heart is not able to pump blood in the right way. This makes it hard for your body to work well. Heart failure is usually a long-term (chronic) condition. You must take good care of yourself and follow your treatment plan from your doctor. What are the causes?  High blood pressure.  Buildup of cholesterol and fat in the arteries.  Heart attack. This injures the heart muscle.  Heart valves that do not open and close properly.  Damage of the heart muscle. This is also called cardiomyopathy.  Infection of the heart muscle. This is also called myocarditis.  Lung disease. What increases the risk?  Getting older. The risk of heart failure goes up as a person ages.  Being overweight.  Being female.  Use tobacco or nicotine products.  Abusing alcohol or drugs.  Having taken medicines that can damage the heart.  Having any of these conditions: ? Diabetes. ? Abnormal heart rhythms. ? Thyroid problems. ? Low blood counts (anemia).  Having a family history of heart failure. What are the signs or symptoms?  Shortness of breath.  Coughing.  Swelling of the feet, ankles, legs, or belly.  Losing or gaining weight for no reason.  Trouble breathing.  Waking from sleep because of the need to sit up and get more air.  Fast heartbeat.  Being very tired.  Feeling dizzy, or feeling like you may pass out (faint).  Having no desire to eat.  Feeling like you may vomit (nauseous).  Peeing (urinating) more at  night.  Feeling confused. How is this treated? This condition may be treated with:  Medicines. These can be given to treat blood pressure and to make the heart muscles stronger.  Changes in your daily life. These may include: ? Eating a healthy diet. ? Staying at a healthy body weight. ? Quitting tobacco, alcohol, and drug use. ? Doing exercises. ? Participating in a cardiac rehabilitation program. This program helps you improve your health through exercise, education, and counseling.  Surgery. Surgery can be done to open blocked valves, or to put devices in the heart, such as pacemakers.  A donor heart (heart transplant). You will receive a healthy heart from a donor. Follow these instructions at home:  Treat other conditions as told by your doctor. These may include high blood pressure, diabetes, thyroid disease, or abnormal heart rhythms.  Learn as much as you can about heart failure.  Get support as you need it.  Keep all follow-up visits. Summary  Heart failure means that your heart is not able to pump blood in the right way.  This condition is often caused by high blood pressure, heart attack, or damage of the heart muscle.  Symptoms of this condition include shortness of breath and swelling of the feet, ankles, legs, or belly. You may also feel very tired or feel like you may vomit.  You may be treated with medicines, surgery, or changes in your daily life.  Treat other health conditions as told by your doctor. This information is not intended  to replace advice given to you by your health care provider. Make sure you discuss any questions you have with your health care provider. Document Revised: 10/27/2019 Document Reviewed: 10/27/2019 Elsevier Patient Education  [place].

## 2020-07-09 ENCOUNTER — Telehealth: Payer: Self-pay | Admitting: Primary Care

## 2020-07-09 DIAGNOSIS — J90 Pleural effusion, not elsewhere classified: Secondary | ICD-10-CM

## 2020-07-09 MED ORDER — FUROSEMIDE 20 MG PO TABS: ORAL_TABLET | ORAL | 0 refills | Status: DC

## 2020-07-09 NOTE — Telephone Encounter (Signed)
Patient is returning phone call. Patient phone number is [phone].

## 2020-07-09 NOTE — Telephone Encounter (Signed)
Spoke with pt and reviewed blood lab results and Elizabeth's recommendation. Placed orders for lasix, BMET and BNP. Pt stated understanding of medication and need to come into office in 2-4 weeks for lab draw. Nothing further needed at this time.

## 2020-07-09 NOTE — Telephone Encounter (Signed)
ATC patient.  LMTCB. 

## 2020-07-09 NOTE — Telephone Encounter (Signed)
ATC LVMTCB x 1  

## 2020-07-11 DIAGNOSIS — I110 Hypertensive heart disease with heart failure: Secondary | ICD-10-CM | POA: Diagnosis not present

## 2020-07-11 DIAGNOSIS — R0789 Other chest pain: Secondary | ICD-10-CM | POA: Diagnosis not present

## 2020-07-11 DIAGNOSIS — Z48815 Encounter for surgical aftercare following surgery on the digestive system: Secondary | ICD-10-CM | POA: Diagnosis not present

## 2020-07-11 DIAGNOSIS — I2609 Other pulmonary embolism with acute cor pulmonale: Secondary | ICD-10-CM | POA: Diagnosis not present

## 2020-07-11 DIAGNOSIS — J9621 Acute and chronic respiratory failure with hypoxia: Secondary | ICD-10-CM | POA: Diagnosis not present

## 2020-07-11 DIAGNOSIS — M1990 Unspecified osteoarthritis, unspecified site: Secondary | ICD-10-CM | POA: Diagnosis not present

## 2020-07-11 DIAGNOSIS — E041 Nontoxic single thyroid nodule: Secondary | ICD-10-CM | POA: Diagnosis not present

## 2020-07-11 DIAGNOSIS — Z86711 Personal history of pulmonary embolism: Secondary | ICD-10-CM | POA: Diagnosis not present

## 2020-07-11 DIAGNOSIS — J918 Pleural effusion in other conditions classified elsewhere: Secondary | ICD-10-CM | POA: Diagnosis not present

## 2020-07-11 DIAGNOSIS — I5032 Chronic diastolic (congestive) heart failure: Secondary | ICD-10-CM | POA: Diagnosis not present

## 2020-07-11 DIAGNOSIS — K219 Gastro-esophageal reflux disease without esophagitis: Secondary | ICD-10-CM | POA: Diagnosis not present

## 2020-07-11 DIAGNOSIS — J9690 Respiratory failure, unspecified, unspecified whether with hypoxia or hypercapnia: Secondary | ICD-10-CM | POA: Diagnosis not present

## 2020-07-14 DIAGNOSIS — J9621 Acute and chronic respiratory failure with hypoxia: Secondary | ICD-10-CM | POA: Diagnosis not present

## 2020-07-14 DIAGNOSIS — D649 Anemia, unspecified: Secondary | ICD-10-CM | POA: Diagnosis not present

## 2020-07-14 DIAGNOSIS — I110 Hypertensive heart disease with heart failure: Secondary | ICD-10-CM | POA: Diagnosis not present

## 2020-07-14 DIAGNOSIS — E041 Nontoxic single thyroid nodule: Secondary | ICD-10-CM | POA: Diagnosis not present

## 2020-07-14 DIAGNOSIS — G4734 Idiopathic sleep related nonobstructive alveolar hypoventilation: Secondary | ICD-10-CM | POA: Diagnosis not present

## 2020-07-14 DIAGNOSIS — R600 Localized edema: Secondary | ICD-10-CM | POA: Diagnosis not present

## 2020-07-14 DIAGNOSIS — I5032 Chronic diastolic (congestive) heart failure: Secondary | ICD-10-CM | POA: Diagnosis not present

## 2020-07-14 DIAGNOSIS — Z86711 Personal history of pulmonary embolism: Secondary | ICD-10-CM | POA: Diagnosis not present

## 2020-07-14 DIAGNOSIS — I872 Venous insufficiency (chronic) (peripheral): Secondary | ICD-10-CM | POA: Diagnosis not present

## 2020-07-14 DIAGNOSIS — K219 Gastro-esophageal reflux disease without esophagitis: Secondary | ICD-10-CM | POA: Diagnosis not present

## 2020-07-14 DIAGNOSIS — M1990 Unspecified osteoarthritis, unspecified site: Secondary | ICD-10-CM | POA: Diagnosis not present

## 2020-07-14 DIAGNOSIS — Z48815 Encounter for surgical aftercare following surgery on the digestive system: Secondary | ICD-10-CM | POA: Diagnosis not present

## 2020-07-15 DIAGNOSIS — Z85820 Personal history of malignant melanoma of skin: Secondary | ICD-10-CM | POA: Diagnosis not present

## 2020-07-15 DIAGNOSIS — Z85828 Personal history of other malignant neoplasm of skin: Secondary | ICD-10-CM | POA: Diagnosis not present

## 2020-07-15 DIAGNOSIS — L089 Local infection of the skin and subcutaneous tissue, unspecified: Secondary | ICD-10-CM | POA: Diagnosis not present

## 2020-07-15 DIAGNOSIS — L570 Actinic keratosis: Secondary | ICD-10-CM | POA: Diagnosis not present

## 2020-07-15 DIAGNOSIS — D229 Melanocytic nevi, unspecified: Secondary | ICD-10-CM | POA: Diagnosis not present

## 2020-07-15 DIAGNOSIS — L814 Other melanin hyperpigmentation: Secondary | ICD-10-CM | POA: Diagnosis not present

## 2020-07-15 DIAGNOSIS — L821 Other seborrheic keratosis: Secondary | ICD-10-CM | POA: Diagnosis not present

## 2020-07-15 DIAGNOSIS — Z1283 Encounter for screening for malignant neoplasm of skin: Secondary | ICD-10-CM | POA: Diagnosis not present

## 2020-07-17 ENCOUNTER — Ambulatory Visit: Payer: Medicare PPO | Admitting: Gastroenterology

## 2020-07-24 DIAGNOSIS — Z48815 Encounter for surgical aftercare following surgery on the digestive system: Secondary | ICD-10-CM | POA: Diagnosis not present

## 2020-07-24 DIAGNOSIS — I5032 Chronic diastolic (congestive) heart failure: Secondary | ICD-10-CM | POA: Diagnosis not present

## 2020-07-24 DIAGNOSIS — J9621 Acute and chronic respiratory failure with hypoxia: Secondary | ICD-10-CM | POA: Diagnosis not present

## 2020-07-24 DIAGNOSIS — K219 Gastro-esophageal reflux disease without esophagitis: Secondary | ICD-10-CM | POA: Diagnosis not present

## 2020-07-24 DIAGNOSIS — Z86711 Personal history of pulmonary embolism: Secondary | ICD-10-CM | POA: Diagnosis not present

## 2020-07-24 DIAGNOSIS — M1990 Unspecified osteoarthritis, unspecified site: Secondary | ICD-10-CM | POA: Diagnosis not present

## 2020-07-24 DIAGNOSIS — I110 Hypertensive heart disease with heart failure: Secondary | ICD-10-CM | POA: Diagnosis not present

## 2020-07-24 DIAGNOSIS — E041 Nontoxic single thyroid nodule: Secondary | ICD-10-CM | POA: Diagnosis not present

## 2020-07-25 DIAGNOSIS — M1990 Unspecified osteoarthritis, unspecified site: Secondary | ICD-10-CM | POA: Diagnosis not present

## 2020-07-25 DIAGNOSIS — Z48815 Encounter for surgical aftercare following surgery on the digestive system: Secondary | ICD-10-CM | POA: Diagnosis not present

## 2020-07-25 DIAGNOSIS — E041 Nontoxic single thyroid nodule: Secondary | ICD-10-CM | POA: Diagnosis not present

## 2020-07-25 DIAGNOSIS — I110 Hypertensive heart disease with heart failure: Secondary | ICD-10-CM | POA: Diagnosis not present

## 2020-07-25 DIAGNOSIS — Z86711 Personal history of pulmonary embolism: Secondary | ICD-10-CM | POA: Diagnosis not present

## 2020-07-25 DIAGNOSIS — J9621 Acute and chronic respiratory failure with hypoxia: Secondary | ICD-10-CM | POA: Diagnosis not present

## 2020-07-25 DIAGNOSIS — I5032 Chronic diastolic (congestive) heart failure: Secondary | ICD-10-CM | POA: Diagnosis not present

## 2020-07-25 DIAGNOSIS — K219 Gastro-esophageal reflux disease without esophagitis: Secondary | ICD-10-CM | POA: Diagnosis not present

## 2020-07-29 DIAGNOSIS — J9621 Acute and chronic respiratory failure with hypoxia: Secondary | ICD-10-CM | POA: Diagnosis not present

## 2020-07-29 DIAGNOSIS — E041 Nontoxic single thyroid nodule: Secondary | ICD-10-CM | POA: Diagnosis not present

## 2020-07-29 DIAGNOSIS — Z48815 Encounter for surgical aftercare following surgery on the digestive system: Secondary | ICD-10-CM | POA: Diagnosis not present

## 2020-07-29 DIAGNOSIS — I110 Hypertensive heart disease with heart failure: Secondary | ICD-10-CM | POA: Diagnosis not present

## 2020-07-29 DIAGNOSIS — I5032 Chronic diastolic (congestive) heart failure: Secondary | ICD-10-CM | POA: Diagnosis not present

## 2020-07-29 DIAGNOSIS — Z86711 Personal history of pulmonary embolism: Secondary | ICD-10-CM | POA: Diagnosis not present

## 2020-07-29 DIAGNOSIS — M1990 Unspecified osteoarthritis, unspecified site: Secondary | ICD-10-CM | POA: Diagnosis not present

## 2020-07-29 DIAGNOSIS — K219 Gastro-esophageal reflux disease without esophagitis: Secondary | ICD-10-CM | POA: Diagnosis not present

## 2020-07-31 ENCOUNTER — Other Ambulatory Visit: Payer: Self-pay

## 2020-07-31 ENCOUNTER — Telehealth (INDEPENDENT_AMBULATORY_CARE_PROVIDER_SITE_OTHER): Payer: Medicare PPO | Admitting: Thoracic Surgery (Cardiothoracic Vascular Surgery)

## 2020-07-31 ENCOUNTER — Encounter: Payer: Self-pay | Admitting: Thoracic Surgery (Cardiothoracic Vascular Surgery)

## 2020-07-31 DIAGNOSIS — Z86711 Personal history of pulmonary embolism: Secondary | ICD-10-CM | POA: Diagnosis not present

## 2020-07-31 DIAGNOSIS — J9621 Acute and chronic respiratory failure with hypoxia: Secondary | ICD-10-CM | POA: Diagnosis not present

## 2020-07-31 DIAGNOSIS — I110 Hypertensive heart disease with heart failure: Secondary | ICD-10-CM | POA: Diagnosis not present

## 2020-07-31 DIAGNOSIS — E041 Nontoxic single thyroid nodule: Secondary | ICD-10-CM | POA: Diagnosis not present

## 2020-07-31 DIAGNOSIS — I5032 Chronic diastolic (congestive) heart failure: Secondary | ICD-10-CM | POA: Diagnosis not present

## 2020-07-31 DIAGNOSIS — Z9889 Other specified postprocedural states: Secondary | ICD-10-CM | POA: Diagnosis not present

## 2020-07-31 DIAGNOSIS — M1990 Unspecified osteoarthritis, unspecified site: Secondary | ICD-10-CM | POA: Diagnosis not present

## 2020-07-31 DIAGNOSIS — Z48815 Encounter for surgical aftercare following surgery on the digestive system: Secondary | ICD-10-CM | POA: Diagnosis not present

## 2020-07-31 DIAGNOSIS — K219 Gastro-esophageal reflux disease without esophagitis: Secondary | ICD-10-CM | POA: Diagnosis not present

## 2020-07-31 MED ORDER — METOCLOPRAMIDE HCL 5 MG PO TABS: 5.0000 mg | ORAL_TABLET | Freq: Three times a day (TID) | ORAL | 1 refills | Status: DC

## 2020-07-31 NOTE — Progress Notes (Signed)
     [address]       [place] [address]             [phone]       Patient: Home Provider: Office Consent for Telemedicine visit obtained.  Today's visit was completed via a real-time telehealth (see specific modality noted below). The patient/authorized person provided oral consent at the time of the visit to engage in a telemedicine encounter with the present provider at [facility]. The patient/authorized person was informed of the potential benefits, limitations, and risks of telemedicine. The patient/authorized person expressed understanding that the laws that protect confidentiality also apply to telemedicine. The patient/authorized person acknowledged understanding that telemedicine does not provide emergency services and that he or she would need to call 911 or proceed to the nearest hospital for help if such a need arose.  . Total time spent in the clinical discussion 10 minutes. . Telehealth Modality: Phone visit (audio only)  I had a telephone visit with [name].  She states that she feels 100% better.  She occasionally has some reflux, but denies any dysphagia.  She is able to eat steak without any problems.  Her weight has been stable as well.  I have given her another refill for the Reglan and I will touch base with her in 1 month to start transitioning her off of the Reglan.  Tannis Burstein Bary Leriche

## 2020-08-01 ENCOUNTER — Telehealth: Payer: Medicare PPO | Admitting: Thoracic Surgery (Cardiothoracic Vascular Surgery)

## 2020-08-05 ENCOUNTER — Telehealth: Payer: Self-pay

## 2020-08-05 DIAGNOSIS — J9621 Acute and chronic respiratory failure with hypoxia: Secondary | ICD-10-CM | POA: Diagnosis not present

## 2020-08-05 DIAGNOSIS — E041 Nontoxic single thyroid nodule: Secondary | ICD-10-CM | POA: Diagnosis not present

## 2020-08-05 DIAGNOSIS — Z48815 Encounter for surgical aftercare following surgery on the digestive system: Secondary | ICD-10-CM | POA: Diagnosis not present

## 2020-08-05 DIAGNOSIS — I5032 Chronic diastolic (congestive) heart failure: Secondary | ICD-10-CM | POA: Diagnosis not present

## 2020-08-05 DIAGNOSIS — M1990 Unspecified osteoarthritis, unspecified site: Secondary | ICD-10-CM | POA: Diagnosis not present

## 2020-08-05 DIAGNOSIS — I110 Hypertensive heart disease with heart failure: Secondary | ICD-10-CM | POA: Diagnosis not present

## 2020-08-05 DIAGNOSIS — K219 Gastro-esophageal reflux disease without esophagitis: Secondary | ICD-10-CM | POA: Diagnosis not present

## 2020-08-05 DIAGNOSIS — Z86711 Personal history of pulmonary embolism: Secondary | ICD-10-CM | POA: Diagnosis not present

## 2020-08-05 NOTE — Telephone Encounter (Signed)
Anderson Malta, PT with [facility] contacted the office [phone] stating that patient was having severe left lower abdominal pain and diarrhea.  Advised that this is not surgery related and she should contact her PCP for further instructions.  She acknowledged receipt.

## 2020-08-11 DIAGNOSIS — R0789 Other chest pain: Secondary | ICD-10-CM | POA: Diagnosis not present

## 2020-08-11 DIAGNOSIS — J918 Pleural effusion in other conditions classified elsewhere: Secondary | ICD-10-CM | POA: Diagnosis not present

## 2020-08-11 DIAGNOSIS — I2609 Other pulmonary embolism with acute cor pulmonale: Secondary | ICD-10-CM | POA: Diagnosis not present

## 2020-08-11 DIAGNOSIS — J9690 Respiratory failure, unspecified, unspecified whether with hypoxia or hypercapnia: Secondary | ICD-10-CM | POA: Diagnosis not present

## 2020-08-13 DIAGNOSIS — H26493 Other secondary cataract, bilateral: Secondary | ICD-10-CM | POA: Diagnosis not present

## 2020-08-13 DIAGNOSIS — Z86711 Personal history of pulmonary embolism: Secondary | ICD-10-CM | POA: Diagnosis not present

## 2020-08-13 DIAGNOSIS — I110 Hypertensive heart disease with heart failure: Secondary | ICD-10-CM | POA: Diagnosis not present

## 2020-08-13 DIAGNOSIS — H524 Presbyopia: Secondary | ICD-10-CM | POA: Diagnosis not present

## 2020-08-13 DIAGNOSIS — K219 Gastro-esophageal reflux disease without esophagitis: Secondary | ICD-10-CM | POA: Diagnosis not present

## 2020-08-13 DIAGNOSIS — E041 Nontoxic single thyroid nodule: Secondary | ICD-10-CM | POA: Diagnosis not present

## 2020-08-13 DIAGNOSIS — J9621 Acute and chronic respiratory failure with hypoxia: Secondary | ICD-10-CM | POA: Diagnosis not present

## 2020-08-13 DIAGNOSIS — H35033 Hypertensive retinopathy, bilateral: Secondary | ICD-10-CM | POA: Diagnosis not present

## 2020-08-13 DIAGNOSIS — Z48815 Encounter for surgical aftercare following surgery on the digestive system: Secondary | ICD-10-CM | POA: Diagnosis not present

## 2020-08-13 DIAGNOSIS — M1990 Unspecified osteoarthritis, unspecified site: Secondary | ICD-10-CM | POA: Diagnosis not present

## 2020-08-13 DIAGNOSIS — I5032 Chronic diastolic (congestive) heart failure: Secondary | ICD-10-CM | POA: Diagnosis not present

## 2020-08-14 DIAGNOSIS — M1990 Unspecified osteoarthritis, unspecified site: Secondary | ICD-10-CM | POA: Diagnosis not present

## 2020-08-14 DIAGNOSIS — E041 Nontoxic single thyroid nodule: Secondary | ICD-10-CM | POA: Diagnosis not present

## 2020-08-14 DIAGNOSIS — Z48815 Encounter for surgical aftercare following surgery on the digestive system: Secondary | ICD-10-CM | POA: Diagnosis not present

## 2020-08-14 DIAGNOSIS — K219 Gastro-esophageal reflux disease without esophagitis: Secondary | ICD-10-CM | POA: Diagnosis not present

## 2020-08-14 DIAGNOSIS — G4734 Idiopathic sleep related nonobstructive alveolar hypoventilation: Secondary | ICD-10-CM | POA: Diagnosis not present

## 2020-08-14 DIAGNOSIS — I5032 Chronic diastolic (congestive) heart failure: Secondary | ICD-10-CM | POA: Diagnosis not present

## 2020-08-14 DIAGNOSIS — I872 Venous insufficiency (chronic) (peripheral): Secondary | ICD-10-CM | POA: Diagnosis not present

## 2020-08-14 DIAGNOSIS — R600 Localized edema: Secondary | ICD-10-CM | POA: Diagnosis not present

## 2020-08-14 DIAGNOSIS — Z86711 Personal history of pulmonary embolism: Secondary | ICD-10-CM | POA: Diagnosis not present

## 2020-08-14 DIAGNOSIS — J9621 Acute and chronic respiratory failure with hypoxia: Secondary | ICD-10-CM | POA: Diagnosis not present

## 2020-08-14 DIAGNOSIS — D649 Anemia, unspecified: Secondary | ICD-10-CM | POA: Diagnosis not present

## 2020-08-14 DIAGNOSIS — I110 Hypertensive heart disease with heart failure: Secondary | ICD-10-CM | POA: Diagnosis not present

## 2020-08-20 DIAGNOSIS — L570 Actinic keratosis: Secondary | ICD-10-CM | POA: Diagnosis not present

## 2020-08-21 DIAGNOSIS — Z86711 Personal history of pulmonary embolism: Secondary | ICD-10-CM | POA: Diagnosis not present

## 2020-08-21 DIAGNOSIS — K219 Gastro-esophageal reflux disease without esophagitis: Secondary | ICD-10-CM | POA: Diagnosis not present

## 2020-08-21 DIAGNOSIS — M1990 Unspecified osteoarthritis, unspecified site: Secondary | ICD-10-CM | POA: Diagnosis not present

## 2020-08-21 DIAGNOSIS — I110 Hypertensive heart disease with heart failure: Secondary | ICD-10-CM | POA: Diagnosis not present

## 2020-08-21 DIAGNOSIS — E041 Nontoxic single thyroid nodule: Secondary | ICD-10-CM | POA: Diagnosis not present

## 2020-08-21 DIAGNOSIS — I5032 Chronic diastolic (congestive) heart failure: Secondary | ICD-10-CM | POA: Diagnosis not present

## 2020-08-21 DIAGNOSIS — J9621 Acute and chronic respiratory failure with hypoxia: Secondary | ICD-10-CM | POA: Diagnosis not present

## 2020-08-21 DIAGNOSIS — Z48815 Encounter for surgical aftercare following surgery on the digestive system: Secondary | ICD-10-CM | POA: Diagnosis not present

## 2020-08-28 ENCOUNTER — Other Ambulatory Visit: Payer: Self-pay

## 2020-08-28 DIAGNOSIS — Z48815 Encounter for surgical aftercare following surgery on the digestive system: Secondary | ICD-10-CM | POA: Diagnosis not present

## 2020-08-28 DIAGNOSIS — I5189 Other ill-defined heart diseases: Secondary | ICD-10-CM | POA: Insufficient documentation

## 2020-08-28 DIAGNOSIS — I110 Hypertensive heart disease with heart failure: Secondary | ICD-10-CM | POA: Diagnosis not present

## 2020-08-28 DIAGNOSIS — I872 Venous insufficiency (chronic) (peripheral): Secondary | ICD-10-CM | POA: Insufficient documentation

## 2020-08-28 DIAGNOSIS — I5032 Chronic diastolic (congestive) heart failure: Secondary | ICD-10-CM | POA: Diagnosis not present

## 2020-08-28 DIAGNOSIS — Z86711 Personal history of pulmonary embolism: Secondary | ICD-10-CM | POA: Diagnosis not present

## 2020-08-28 DIAGNOSIS — J9621 Acute and chronic respiratory failure with hypoxia: Secondary | ICD-10-CM | POA: Diagnosis not present

## 2020-08-28 DIAGNOSIS — R7303 Prediabetes: Secondary | ICD-10-CM

## 2020-08-28 DIAGNOSIS — R438 Other disturbances of smell and taste: Secondary | ICD-10-CM

## 2020-08-28 DIAGNOSIS — R609 Edema, unspecified: Secondary | ICD-10-CM | POA: Insufficient documentation

## 2020-08-28 DIAGNOSIS — K2100 Gastro-esophageal reflux disease with esophagitis, without bleeding: Secondary | ICD-10-CM

## 2020-08-28 DIAGNOSIS — K219 Gastro-esophageal reflux disease without esophagitis: Secondary | ICD-10-CM | POA: Diagnosis not present

## 2020-08-28 DIAGNOSIS — R413 Other amnesia: Secondary | ICD-10-CM

## 2020-08-28 DIAGNOSIS — E041 Nontoxic single thyroid nodule: Secondary | ICD-10-CM | POA: Diagnosis not present

## 2020-08-28 DIAGNOSIS — N390 Urinary tract infection, site not specified: Secondary | ICD-10-CM | POA: Insufficient documentation

## 2020-08-28 DIAGNOSIS — M810 Age-related osteoporosis without current pathological fracture: Secondary | ICD-10-CM | POA: Insufficient documentation

## 2020-08-28 DIAGNOSIS — M1990 Unspecified osteoarthritis, unspecified site: Secondary | ICD-10-CM | POA: Diagnosis not present

## 2020-08-28 DIAGNOSIS — G4734 Idiopathic sleep related nonobstructive alveolar hypoventilation: Secondary | ICD-10-CM

## 2020-08-28 DIAGNOSIS — R0989 Other specified symptoms and signs involving the circulatory and respiratory systems: Secondary | ICD-10-CM

## 2020-08-28 DIAGNOSIS — R269 Unspecified abnormalities of gait and mobility: Secondary | ICD-10-CM

## 2020-08-28 DIAGNOSIS — K5901 Slow transit constipation: Secondary | ICD-10-CM | POA: Insufficient documentation

## 2020-08-28 HISTORY — DX: Venous insufficiency (chronic) (peripheral): I87.2

## 2020-08-28 HISTORY — DX: Slow transit constipation: K59.01

## 2020-08-28 HISTORY — DX: Other disturbances of smell and taste: R43.8

## 2020-08-28 HISTORY — DX: Unspecified abnormalities of gait and mobility: R26.9

## 2020-08-28 HISTORY — DX: Other specified symptoms and signs involving the circulatory and respiratory systems: R09.89

## 2020-08-28 HISTORY — DX: Prediabetes: R73.03

## 2020-08-28 HISTORY — DX: Idiopathic sleep related nonobstructive alveolar hypoventilation: G47.34

## 2020-08-28 HISTORY — DX: Gastro-esophageal reflux disease with esophagitis, without bleeding: K21.00

## 2020-08-28 HISTORY — DX: Edema, unspecified: R60.9

## 2020-08-28 HISTORY — DX: Other amnesia: R41.3

## 2020-08-28 HISTORY — DX: Other ill-defined heart diseases: I51.89

## 2020-08-29 ENCOUNTER — Encounter: Payer: Self-pay | Admitting: Thoracic Surgery (Cardiothoracic Vascular Surgery)

## 2020-08-29 ENCOUNTER — Telehealth (INDEPENDENT_AMBULATORY_CARE_PROVIDER_SITE_OTHER): Payer: Medicare PPO | Admitting: Thoracic Surgery (Cardiothoracic Vascular Surgery)

## 2020-08-29 ENCOUNTER — Other Ambulatory Visit: Payer: Self-pay

## 2020-08-29 DIAGNOSIS — Z9889 Other specified postprocedural states: Secondary | ICD-10-CM | POA: Diagnosis not present

## 2020-08-29 NOTE — Progress Notes (Signed)
     [address]       [place] [address]             [phone]       Patient: Home Provider: Office Consent for Telemedicine visit obtained.  Today's visit was completed via a real-time telehealth (see specific modality noted below). The patient/authorized person provided oral consent at the time of the visit to engage in a telemedicine encounter with the present provider at [facility]. The patient/authorized person was informed of the potential benefits, limitations, and risks of telemedicine. The patient/authorized person expressed understanding that the laws that protect confidentiality also apply to telemedicine. The patient/authorized person acknowledged understanding that telemedicine does not provide emergency services and that he or she would need to call 911 or proceed to the nearest hospital for help if such a need arose.  . Total time spent in the clinical discussion 10 minutes. . Telehealth Modality: Phone visit (audio only)  I had a telephone visit with [name].  Overall she is doing much better.  She is able to eat without any difficulty.  She has lost a little bit of weight.  She has switched back to omeprazole because the Protonix was giving her abdominal pain and diarrhea.  We have started to transition her off Reglan, and she should be completely off within 2 weeks.  I will check back up with her as a virtual visit in 1 month.

## 2020-09-01 ENCOUNTER — Ambulatory Visit: Payer: Medicare PPO | Admitting: Pulmonary Disease

## 2020-09-01 ENCOUNTER — Other Ambulatory Visit: Payer: Self-pay

## 2020-09-01 ENCOUNTER — Encounter: Payer: Self-pay | Admitting: Pulmonary Disease

## 2020-09-01 VITALS — BP 144/70 | HR 74 | Ht 61.0 in | Wt 153.0 lb

## 2020-09-01 DIAGNOSIS — R0600 Dyspnea, unspecified: Secondary | ICD-10-CM | POA: Diagnosis not present

## 2020-09-01 DIAGNOSIS — R058 Other specified cough: Secondary | ICD-10-CM | POA: Diagnosis not present

## 2020-09-01 DIAGNOSIS — J449 Chronic obstructive pulmonary disease, unspecified: Secondary | ICD-10-CM

## 2020-09-01 DIAGNOSIS — Z9889 Other specified postprocedural states: Secondary | ICD-10-CM | POA: Diagnosis not present

## 2020-09-01 DIAGNOSIS — Z8719 Personal history of other diseases of the digestive system: Secondary | ICD-10-CM | POA: Diagnosis not present

## 2020-09-01 DIAGNOSIS — R0609 Other forms of dyspnea: Secondary | ICD-10-CM

## 2020-09-01 MED ORDER — BREO ELLIPTA 200-25 MCG/INH IN AEPB: 1.0000 | INHALATION_SPRAY | Freq: Every day | RESPIRATORY_TRACT | 0 refills | Status: DC

## 2020-09-01 MED ORDER — ALBUTEROL SULFATE HFA 108 (90 BASE) MCG/ACT IN AERS: 2.0000 | INHALATION_SPRAY | Freq: Four times a day (QID) | RESPIRATORY_TRACT | 3 refills | Status: DC | PRN

## 2020-09-01 MED ORDER — ALBUTEROL SULFATE HFA 108 (90 BASE) MCG/ACT IN AERS: 2.0000 | INHALATION_SPRAY | Freq: Four times a day (QID) | RESPIRATORY_TRACT | 6 refills | Status: DC | PRN

## 2020-09-01 MED ORDER — BREO ELLIPTA 200-25 MCG/INH IN AEPB: 1.0000 | INHALATION_SPRAY | Freq: Every day | RESPIRATORY_TRACT | 6 refills | Status: AC

## 2020-09-01 NOTE — Patient Instructions (Addendum)
Thank you for visiting Dr. Valeta Harms at [facility]. Today we recommend the following:  Samples of Breo 200 New prescription of Breo 200  New prescription for albuterol inhaler   Return in about 6 months (around 03/04/2021), or if symptoms worsen or fail to improve, for with APP or Dr. Valeta Harms.    Please do your part to reduce the spread of COVID-19.
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
## 2020-09-01 NOTE — Progress Notes (Signed)
PCCM:  Patient was instructed on appropriate inhaler technique for new device the Ellipta device as well as the albuterol HFA.  Patient was able to demonstrate appropriate technique.  Garner Nash, DO [facility] 09/01/2020 12:34 PM

## 2020-09-02 ENCOUNTER — Encounter: Payer: Self-pay | Admitting: Cardiology

## 2020-09-02 ENCOUNTER — Ambulatory Visit: Payer: Medicare PPO | Admitting: Cardiology

## 2020-09-02 VITALS — BP 172/78 | HR 74 | Ht 62.0 in | Wt 153.0 lb

## 2020-09-02 DIAGNOSIS — R0600 Dyspnea, unspecified: Secondary | ICD-10-CM | POA: Diagnosis not present

## 2020-09-02 DIAGNOSIS — I5189 Other ill-defined heart diseases: Secondary | ICD-10-CM | POA: Diagnosis not present

## 2020-09-02 DIAGNOSIS — I5032 Chronic diastolic (congestive) heart failure: Secondary | ICD-10-CM

## 2020-09-02 DIAGNOSIS — R0609 Other forms of dyspnea: Secondary | ICD-10-CM

## 2020-09-02 DIAGNOSIS — R0602 Shortness of breath: Secondary | ICD-10-CM

## 2020-09-02 DIAGNOSIS — Z8719 Personal history of other diseases of the digestive system: Secondary | ICD-10-CM

## 2020-09-02 DIAGNOSIS — Z9889 Other specified postprocedural states: Secondary | ICD-10-CM | POA: Diagnosis not present
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
## 2020-09-02 NOTE — Patient Instructions (Signed)
Medication Instructions:  Your physician recommends that you continue on your current medications as directed. Please refer to the Current Medication list given to you today.  *If you need a refill on your cardiac medications before your next appointment, please call your pharmacy*   Lab Work: None If you have labs (blood work) drawn today and your tests are completely normal, you will receive your results only by: Marland Kitchen MyChart Message (if you have MyChart) OR . A paper copy in the mail If you have any lab test that is abnormal or we need to change your treatment, we will call you to review the results.   Testing/Procedures: Your physician has requested that you have an echocardiogram. Echocardiography is a painless test that uses sound waves to create images of your heart. It provides your doctor with information about the size and shape of your heart and how well your heart's chambers and valves are working. This procedure takes approximately one hour. There are no restrictions for this procedure.    Follow-Up: At [facility], you and your health needs are our priority.  As part of our continuing mission to provide you with exceptional heart care, we have created designated Provider Care Teams.  These Care Teams include your primary Cardiologist (physician) and Advanced Practice Providers (APPs -  Physician Assistants and Nurse Practitioners) who all work together to provide you with the care you need, when you need it.  We recommend signing up for the patient portal called "MyChart".  Sign up information is provided on this After Visit Summary.  MyChart is used to connect with patients for Virtual Visits (Telemedicine).  Patients are able to view lab/test results, encounter notes, upcoming appointments, etc.  Non-urgent messages can be sent to your provider as well.   To learn more about what you can do with MyChart, go to [email or web address].    Your next appointment:   5  month(s)  The format for your next appointment:   In Person  Provider:   Jenne Campus, MD   Other Instructions

## 2020-09-09 DIAGNOSIS — K449 Diaphragmatic hernia without obstruction or gangrene: Secondary | ICD-10-CM | POA: Diagnosis not present

## 2020-09-09 DIAGNOSIS — D509 Iron deficiency anemia, unspecified: Secondary | ICD-10-CM | POA: Diagnosis not present

## 2020-09-09 DIAGNOSIS — I10 Essential (primary) hypertension: Secondary | ICD-10-CM | POA: Diagnosis not present

## 2020-09-09 DIAGNOSIS — M810 Age-related osteoporosis without current pathological fracture: Secondary | ICD-10-CM | POA: Diagnosis not present

## 2020-09-09 DIAGNOSIS — I509 Heart failure, unspecified: Secondary | ICD-10-CM | POA: Diagnosis not present

## 2020-09-09 DIAGNOSIS — I872 Venous insufficiency (chronic) (peripheral): Secondary | ICD-10-CM | POA: Diagnosis not present

## 2020-09-09 DIAGNOSIS — R634 Abnormal weight loss: Secondary | ICD-10-CM | POA: Diagnosis not present

## 2020-09-09 DIAGNOSIS — J449 Chronic obstructive pulmonary disease, unspecified: Secondary | ICD-10-CM | POA: Diagnosis not present

## 2020-09-10 DIAGNOSIS — I2609 Other pulmonary embolism with acute cor pulmonale: Secondary | ICD-10-CM | POA: Diagnosis not present

## 2020-09-10 DIAGNOSIS — J918 Pleural effusion in other conditions classified elsewhere: Secondary | ICD-10-CM | POA: Diagnosis not present

## 2020-09-10 DIAGNOSIS — R0789 Other chest pain: Secondary | ICD-10-CM | POA: Diagnosis not present

## 2020-09-10 DIAGNOSIS — J9690 Respiratory failure, unspecified, unspecified whether with hypoxia or hypercapnia: Secondary | ICD-10-CM | POA: Diagnosis not present

## 2020-10-02 ENCOUNTER — Other Ambulatory Visit (HOSPITAL_COMMUNITY): Payer: Medicare PPO

## 2020-10-06 ENCOUNTER — Telehealth (INDEPENDENT_AMBULATORY_CARE_PROVIDER_SITE_OTHER): Payer: Medicare PPO | Admitting: Thoracic Surgery (Cardiothoracic Vascular Surgery)

## 2020-10-06 ENCOUNTER — Other Ambulatory Visit: Payer: Self-pay

## 2020-10-06 DIAGNOSIS — Z9889 Other specified postprocedural states: Secondary | ICD-10-CM | POA: Diagnosis not present

## 2020-10-06 NOTE — Progress Notes (Signed)
     [address]       [place] [address]             [phone]       Patient: Home Provider: Office Consent for Telemedicine visit obtained.  Today's visit was completed via a real-time telehealth (see specific modality noted below). The patient/authorized person provided oral consent at the time of the visit to engage in a telemedicine encounter with the present provider at [facility]. The patient/authorized person was informed of the potential benefits, limitations, and risks of telemedicine. The patient/authorized person expressed understanding that the laws that protect confidentiality also apply to telemedicine. The patient/authorized person acknowledged understanding that telemedicine does not provide emergency services and that he or she would need to call 911 or proceed to the nearest hospital for help if such a need arose.   Total time spent in the clinical discussion 10 minutes.  Telehealth Modality: Phone visit (audio only)  I had a telephone visit with [name].  She has been off the reglan for 2 weeks now and has not had any issues eating.  She is eating a regular diet.  She denies any reflux.    She continues to do well.   Follow-up prn  Lajuana Matte

## 2020-10-10 ENCOUNTER — Telehealth: Payer: Medicare PPO | Admitting: Thoracic Surgery (Cardiothoracic Vascular Surgery)

## 2020-10-11 DIAGNOSIS — J918 Pleural effusion in other conditions classified elsewhere: Secondary | ICD-10-CM | POA: Diagnosis not present

## 2020-10-11 DIAGNOSIS — J9690 Respiratory failure, unspecified, unspecified whether with hypoxia or hypercapnia: Secondary | ICD-10-CM | POA: Diagnosis not present

## 2020-10-11 DIAGNOSIS — R0789 Other chest pain: Secondary | ICD-10-CM | POA: Diagnosis not present

## 2020-10-11 DIAGNOSIS — I2609 Other pulmonary embolism with acute cor pulmonale: Secondary | ICD-10-CM | POA: Diagnosis not present

## 2020-10-22 ENCOUNTER — Ambulatory Visit (HOSPITAL_COMMUNITY): Payer: Medicare PPO | Attending: Cardiovascular Disease

## 2020-10-22 ENCOUNTER — Other Ambulatory Visit: Payer: Self-pay

## 2020-10-22 DIAGNOSIS — R0602 Shortness of breath: Secondary | ICD-10-CM | POA: Insufficient documentation

## 2020-10-22 LAB — ECHOCARDIOGRAM COMPLETE
Area-P 1/2: 2.87 cm2
S' Lateral: 2.5 cm

## 2020-10-24 ENCOUNTER — Telehealth: Payer: Self-pay

## 2020-10-24 NOTE — Telephone Encounter (Signed)
Spoke with patient regarding results and recommendation.  Patient verbalizes understanding and is agreeable to plan of care. Advised patient to call back with any issues or concerns.  

## 2020-10-24 NOTE — Telephone Encounter (Signed)
-----   Message from Park Liter, MD sent at 10/24/2020  9:49 AM EDT ----- Echocardiogram shows normal left ventricle ejection fraction, overall looks good

## 2020-11-10 DIAGNOSIS — R0789 Other chest pain: Secondary | ICD-10-CM | POA: Diagnosis not present

## 2020-11-10 DIAGNOSIS — J9690 Respiratory failure, unspecified, unspecified whether with hypoxia or hypercapnia: Secondary | ICD-10-CM | POA: Diagnosis not present

## 2020-11-10 DIAGNOSIS — J918 Pleural effusion in other conditions classified elsewhere: Secondary | ICD-10-CM | POA: Diagnosis not present

## 2020-11-10 DIAGNOSIS — I2609 Other pulmonary embolism with acute cor pulmonale: Secondary | ICD-10-CM | POA: Diagnosis not present

## 2020-11-19 DIAGNOSIS — L570 Actinic keratosis: Secondary | ICD-10-CM | POA: Diagnosis not present

## 2020-11-19 DIAGNOSIS — L719 Rosacea, unspecified: Secondary | ICD-10-CM | POA: Diagnosis not present

## 2020-12-11 DIAGNOSIS — I2609 Other pulmonary embolism with acute cor pulmonale: Secondary | ICD-10-CM | POA: Diagnosis not present

## 2020-12-11 DIAGNOSIS — R0789 Other chest pain: Secondary | ICD-10-CM | POA: Diagnosis not present

## 2020-12-11 DIAGNOSIS — J918 Pleural effusion in other conditions classified elsewhere: Secondary | ICD-10-CM | POA: Diagnosis not present

## 2020-12-11 DIAGNOSIS — J9690 Respiratory failure, unspecified, unspecified whether with hypoxia or hypercapnia: Secondary | ICD-10-CM | POA: Diagnosis not present

## 2020-12-12 DIAGNOSIS — K219 Gastro-esophageal reflux disease without esophagitis: Secondary | ICD-10-CM | POA: Diagnosis not present

## 2020-12-12 DIAGNOSIS — R32 Unspecified urinary incontinence: Secondary | ICD-10-CM | POA: Diagnosis not present

## 2020-12-12 DIAGNOSIS — R600 Localized edema: Secondary | ICD-10-CM | POA: Diagnosis not present

## 2020-12-12 DIAGNOSIS — G629 Polyneuropathy, unspecified: Secondary | ICD-10-CM | POA: Diagnosis not present

## 2020-12-12 DIAGNOSIS — L089 Local infection of the skin and subcutaneous tissue, unspecified: Secondary | ICD-10-CM | POA: Diagnosis not present

## 2020-12-12 DIAGNOSIS — I10 Essential (primary) hypertension: Secondary | ICD-10-CM | POA: Diagnosis not present

## 2020-12-12 DIAGNOSIS — J40 Bronchitis, not specified as acute or chronic: Secondary | ICD-10-CM | POA: Diagnosis not present

## 2020-12-12 DIAGNOSIS — M1990 Unspecified osteoarthritis, unspecified site: Secondary | ICD-10-CM | POA: Diagnosis not present

## 2020-12-12 DIAGNOSIS — J9610 Chronic respiratory failure, unspecified whether with hypoxia or hypercapnia: Secondary | ICD-10-CM | POA: Diagnosis not present

## 2021-01-11 DIAGNOSIS — J918 Pleural effusion in other conditions classified elsewhere: Secondary | ICD-10-CM | POA: Diagnosis not present

## 2021-01-11 DIAGNOSIS — J9690 Respiratory failure, unspecified, unspecified whether with hypoxia or hypercapnia: Secondary | ICD-10-CM | POA: Diagnosis not present

## 2021-01-11 DIAGNOSIS — I2609 Other pulmonary embolism with acute cor pulmonale: Secondary | ICD-10-CM | POA: Diagnosis not present

## 2021-01-11 DIAGNOSIS — R0789 Other chest pain: Secondary | ICD-10-CM | POA: Diagnosis not present

## 2021-01-26 DIAGNOSIS — K222 Esophageal obstruction: Secondary | ICD-10-CM | POA: Insufficient documentation

## 2021-01-27 DIAGNOSIS — R7303 Prediabetes: Secondary | ICD-10-CM | POA: Diagnosis not present

## 2021-01-27 DIAGNOSIS — M810 Age-related osteoporosis without current pathological fracture: Secondary | ICD-10-CM | POA: Diagnosis not present

## 2021-01-27 DIAGNOSIS — Z0000 Encounter for general adult medical examination without abnormal findings: Secondary | ICD-10-CM | POA: Diagnosis not present

## 2021-01-27 DIAGNOSIS — J449 Chronic obstructive pulmonary disease, unspecified: Secondary | ICD-10-CM | POA: Diagnosis not present

## 2021-01-27 DIAGNOSIS — D509 Iron deficiency anemia, unspecified: Secondary | ICD-10-CM | POA: Diagnosis not present

## 2021-01-27 DIAGNOSIS — I10 Essential (primary) hypertension: Secondary | ICD-10-CM | POA: Diagnosis not present

## 2021-01-27 DIAGNOSIS — I509 Heart failure, unspecified: Secondary | ICD-10-CM | POA: Diagnosis not present

## 2021-01-28 ENCOUNTER — Telehealth: Payer: Self-pay | Admitting: Pulmonary Disease

## 2021-01-28 NOTE — Telephone Encounter (Signed)
Left voicemail on home phone for patient to call back to schedule 6 month follow up (due in November). Unable to complete call on mobile number. Recall letter mailed.

## 2021-02-02 ENCOUNTER — Ambulatory Visit: Payer: Medicare PPO | Admitting: Cardiology

## 2021-02-03 ENCOUNTER — Ambulatory Visit: Payer: Medicare PPO | Admitting: Cardiology

## 2021-02-03 ENCOUNTER — Other Ambulatory Visit: Payer: Self-pay

## 2021-02-03 VITALS — BP 122/76 | HR 62 | Ht 62.0 in | Wt 152.0 lb

## 2021-02-03 DIAGNOSIS — I5032 Chronic diastolic (congestive) heart failure: Secondary | ICD-10-CM

## 2021-02-03 DIAGNOSIS — Z9889 Other specified postprocedural states: Secondary | ICD-10-CM

## 2021-02-03 DIAGNOSIS — Z8719 Personal history of other diseases of the digestive system: Secondary | ICD-10-CM

## 2021-02-03 DIAGNOSIS — R0609 Other forms of dyspnea: Secondary | ICD-10-CM | POA: Diagnosis not present

## 2021-02-03 NOTE — Patient Instructions (Signed)
Medication Instructions:  Your physician recommends that you continue on your current medications as directed. Please refer to the Current Medication list given to you today.  *If you need a refill on your cardiac medications before your next appointment, please call your pharmacy*   Lab Work: Your physician recommends that you return for lab work today: PRO BNP  If you have labs (blood work) drawn today and your tests are completely normal, you will receive your results only by: MyChart Message (if you have MyChart) OR A paper copy in the mail If you have any lab test that is abnormal or we need to change your treatment, we will call you to review the results.   Testing/Procedures: None   Follow-Up: At [facility], you and your health needs are our priority.  As part of our continuing mission to provide you with exceptional heart care, we have created designated Provider Care Teams.  These Care Teams include your primary Cardiologist (physician) and Advanced Practice Providers (APPs -  Physician Assistants and Nurse Practitioners) who all work together to provide you with the care you need, when you need it.  We recommend signing up for the patient portal called "MyChart".  Sign up information is provided on this After Visit Summary.  MyChart is used to connect with patients for Virtual Visits (Telemedicine).  Patients are able to view lab/test results, encounter notes, upcoming appointments, etc.  Non-urgent messages can be sent to your provider as well.   To learn more about what you can do with MyChart, go to [email or web address].    Your next appointment:   6 month(s)  The format for your next appointment:   In Person  Provider:   Jenne Campus, MD   Other Instructions

## 2021-02-03 NOTE — Addendum Note (Signed)
Addended by: Senaida Ores on: 02/03/2021 03:07 PM   Modules accepted: Orders
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
## 2021-02-04 LAB — PRO B NATRIURETIC PEPTIDE: NT-Pro BNP: 647 pg/mL (ref 0–738)

## 2021-02-10 DIAGNOSIS — J9690 Respiratory failure, unspecified, unspecified whether with hypoxia or hypercapnia: Secondary | ICD-10-CM | POA: Diagnosis not present

## 2021-02-10 DIAGNOSIS — I2609 Other pulmonary embolism with acute cor pulmonale: Secondary | ICD-10-CM | POA: Diagnosis not present

## 2021-02-10 DIAGNOSIS — J918 Pleural effusion in other conditions classified elsewhere: Secondary | ICD-10-CM | POA: Diagnosis not present

## 2021-02-10 DIAGNOSIS — R0789 Other chest pain: Secondary | ICD-10-CM | POA: Diagnosis not present

## 2021-02-25 DIAGNOSIS — L821 Other seborrheic keratosis: Secondary | ICD-10-CM | POA: Diagnosis not present

## 2021-02-25 DIAGNOSIS — L570 Actinic keratosis: Secondary | ICD-10-CM | POA: Diagnosis not present

## 2021-02-25 DIAGNOSIS — L309 Dermatitis, unspecified: Secondary | ICD-10-CM | POA: Diagnosis not present

## 2021-02-25 DIAGNOSIS — L578 Other skin changes due to chronic exposure to nonionizing radiation: Secondary | ICD-10-CM | POA: Diagnosis not present

## 2021-02-25 DIAGNOSIS — D0439 Carcinoma in situ of skin of other parts of face: Secondary | ICD-10-CM | POA: Diagnosis not present

## 2021-02-25 DIAGNOSIS — L820 Inflamed seborrheic keratosis: Secondary | ICD-10-CM | POA: Diagnosis not present

## 2021-03-05 ENCOUNTER — Encounter: Payer: Self-pay | Admitting: Pulmonary Disease

## 2021-03-05 ENCOUNTER — Ambulatory Visit: Payer: Medicare PPO | Admitting: Pulmonary Disease

## 2021-03-05 ENCOUNTER — Other Ambulatory Visit: Payer: Self-pay

## 2021-03-05 VITALS — BP 136/84 | HR 68 | Temp 97.6°F | Ht 61.0 in | Wt 152.8 lb

## 2021-03-05 DIAGNOSIS — R058 Other specified cough: Secondary | ICD-10-CM | POA: Diagnosis not present

## 2021-03-05 DIAGNOSIS — Z9889 Other specified postprocedural states: Secondary | ICD-10-CM

## 2021-03-05 DIAGNOSIS — Z8719 Personal history of other diseases of the digestive system: Secondary | ICD-10-CM | POA: Diagnosis not present

## 2021-03-05 DIAGNOSIS — J449 Chronic obstructive pulmonary disease, unspecified: Secondary | ICD-10-CM

## 2021-03-05 DIAGNOSIS — K449 Diaphragmatic hernia without obstruction or gangrene: Secondary | ICD-10-CM
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
## 2021-03-05 NOTE — Patient Instructions (Addendum)
Thank you for visiting Dr. Valeta Harms at [facility]. Today we recommend the following:  Continue albuterol as needed.   Return in about 1 year (around 03/05/2022) for with APP or Dr. Valeta Harms.    Please do your part to reduce the spread of COVID-19.

## 2021-03-13 DIAGNOSIS — R0789 Other chest pain: Secondary | ICD-10-CM | POA: Diagnosis not present

## 2021-03-13 DIAGNOSIS — J9690 Respiratory failure, unspecified, unspecified whether with hypoxia or hypercapnia: Secondary | ICD-10-CM | POA: Diagnosis not present

## 2021-03-13 DIAGNOSIS — I2609 Other pulmonary embolism with acute cor pulmonale: Secondary | ICD-10-CM | POA: Diagnosis not present

## 2021-03-13 DIAGNOSIS — J918 Pleural effusion in other conditions classified elsewhere: Secondary | ICD-10-CM | POA: Diagnosis not present

## 2021-04-12 DIAGNOSIS — R0789 Other chest pain: Secondary | ICD-10-CM | POA: Diagnosis not present

## 2021-04-12 DIAGNOSIS — J918 Pleural effusion in other conditions classified elsewhere: Secondary | ICD-10-CM | POA: Diagnosis not present

## 2021-04-12 DIAGNOSIS — I2609 Other pulmonary embolism with acute cor pulmonale: Secondary | ICD-10-CM | POA: Diagnosis not present

## 2021-04-12 DIAGNOSIS — J9690 Respiratory failure, unspecified, unspecified whether with hypoxia or hypercapnia: Secondary | ICD-10-CM | POA: Diagnosis not present

## 2021-04-17 ENCOUNTER — Other Ambulatory Visit: Payer: Self-pay

## 2021-04-17 ENCOUNTER — Encounter (HOSPITAL_BASED_OUTPATIENT_CLINIC_OR_DEPARTMENT_OTHER): Payer: Self-pay | Admitting: Emergency Medicine

## 2021-04-17 ENCOUNTER — Emergency Department (HOSPITAL_BASED_OUTPATIENT_CLINIC_OR_DEPARTMENT_OTHER): Payer: Medicare PPO

## 2021-04-17 ENCOUNTER — Emergency Department (HOSPITAL_BASED_OUTPATIENT_CLINIC_OR_DEPARTMENT_OTHER)
Admission: EM | Admit: 2021-04-17 | Discharge: 2021-04-18 | Disposition: A | Discharge status: Home / Self Care | Payer: Medicare PPO | Attending: Emergency Medicine | Admitting: Emergency Medicine

## 2021-04-17 DIAGNOSIS — R079 Chest pain, unspecified: Secondary | ICD-10-CM | POA: Diagnosis not present

## 2021-04-17 DIAGNOSIS — R932 Abnormal findings on diagnostic imaging of liver and biliary tract: Secondary | ICD-10-CM

## 2021-04-17 DIAGNOSIS — I5032 Chronic diastolic (congestive) heart failure: Secondary | ICD-10-CM | POA: Insufficient documentation

## 2021-04-17 DIAGNOSIS — Z85828 Personal history of other malignant neoplasm of skin: Secondary | ICD-10-CM | POA: Diagnosis not present

## 2021-04-17 DIAGNOSIS — Z20822 Contact with and (suspected) exposure to covid-19: Secondary | ICD-10-CM | POA: Diagnosis not present

## 2021-04-17 DIAGNOSIS — R7401 Elevation of levels of liver transaminase levels: Secondary | ICD-10-CM | POA: Insufficient documentation

## 2021-04-17 DIAGNOSIS — B349 Viral infection, unspecified: Secondary | ICD-10-CM | POA: Diagnosis not present

## 2021-04-17 DIAGNOSIS — R0789 Other chest pain: Secondary | ICD-10-CM | POA: Diagnosis not present

## 2021-04-17 DIAGNOSIS — R748 Abnormal levels of other serum enzymes: Secondary | ICD-10-CM

## 2021-04-17 DIAGNOSIS — K449 Diaphragmatic hernia without obstruction or gangrene: Secondary | ICD-10-CM | POA: Diagnosis not present

## 2021-04-17 DIAGNOSIS — I110 Hypertensive heart disease with heart failure: Secondary | ICD-10-CM | POA: Diagnosis not present

## 2021-04-17 DIAGNOSIS — R0600 Dyspnea, unspecified: Secondary | ICD-10-CM

## 2021-04-17 DIAGNOSIS — Z79899 Other long term (current) drug therapy: Secondary | ICD-10-CM | POA: Insufficient documentation

## 2021-04-17 DIAGNOSIS — R059 Cough, unspecified: Secondary | ICD-10-CM | POA: Diagnosis not present

## 2021-04-17 DIAGNOSIS — Z7982 Long term (current) use of aspirin: Secondary | ICD-10-CM | POA: Insufficient documentation

## 2021-04-17 DIAGNOSIS — R945 Abnormal results of liver function studies: Secondary | ICD-10-CM | POA: Diagnosis not present

## 2021-04-17 DIAGNOSIS — R0602 Shortness of breath: Secondary | ICD-10-CM | POA: Diagnosis not present

## 2021-04-17 DIAGNOSIS — J9811 Atelectasis: Secondary | ICD-10-CM | POA: Diagnosis not present

## 2021-04-17 DIAGNOSIS — R109 Unspecified abdominal pain: Secondary | ICD-10-CM | POA: Diagnosis not present

## 2021-04-17 LAB — HEPATIC FUNCTION PANEL
ALT: 92 U/L — ABNORMAL HIGH (ref 0–44)
AST: 266 U/L — ABNORMAL HIGH (ref 15–41)
Albumin: 3.7 g/dL (ref 3.5–5.0)
Alkaline Phosphatase: 299 U/L — ABNORMAL HIGH (ref 38–126)
Bilirubin, Direct: 1.3 mg/dL — ABNORMAL HIGH (ref 0.0–0.2)
Indirect Bilirubin: 1.2 mg/dL — ABNORMAL HIGH (ref 0.3–0.9)
Total Bilirubin: 2.5 mg/dL — ABNORMAL HIGH (ref 0.3–1.2)
Total Protein: 6.6 g/dL (ref 6.5–8.1)

## 2021-04-17 LAB — BRAIN NATRIURETIC PEPTIDE: B Natriuretic Peptide: 132 pg/mL — ABNORMAL HIGH (ref 0.0–100.0)

## 2021-04-17 LAB — D-DIMER, QUANTITATIVE: D-Dimer, Quant: 1.38 ug/mL-FEU — ABNORMAL HIGH (ref 0.00–0.50)

## 2021-04-17 LAB — TROPONIN I (HIGH SENSITIVITY)
Troponin I (High Sensitivity): 11 ng/L (ref <18)
Troponin I (High Sensitivity): 11 ng/L (ref <18)

## 2021-04-17 LAB — DIFFERENTIAL
Abs Immature Granulocytes: 0.05 10*3/uL (ref 0.00–0.07)
Basophils Absolute: 0 10*3/uL (ref 0.0–0.1)
Basophils Relative: 0 %
Eosinophils Absolute: 0 10*3/uL (ref 0.0–0.5)
Eosinophils Relative: 0 %
Immature Granulocytes: 0 %
Lymphocytes Relative: 6 %
Lymphs Abs: 0.6 10*3/uL — ABNORMAL LOW (ref 0.7–4.0)
Monocytes Absolute: 0.3 10*3/uL (ref 0.1–1.0)
Monocytes Relative: 3 %
Neutro Abs: 10.3 10*3/uL — ABNORMAL HIGH (ref 1.7–7.7)
Neutrophils Relative %: 91 %

## 2021-04-17 LAB — BASIC METABOLIC PANEL
Anion gap: 10 (ref 5–15)
BUN: 17 mg/dL (ref 8–23)
CO2: 25 mmol/L (ref 22–32)
Calcium: 9 mg/dL (ref 8.9–10.3)
Chloride: 100 mmol/L (ref 98–111)
Creatinine, Ser: 0.83 mg/dL (ref 0.44–1.00)
GFR, Estimated: >60 mL/min (ref >60)
Glucose, Bld: 165 mg/dL — ABNORMAL HIGH (ref 70–99)
Potassium: 3.3 mmol/L — ABNORMAL LOW (ref 3.5–5.1)
Sodium: 135 mmol/L (ref 135–145)

## 2021-04-17 LAB — CBC
HCT: 29.5 % — ABNORMAL LOW (ref 36.0–46.0)
Hemoglobin: 10 g/dL — ABNORMAL LOW (ref 12.0–15.0)
MCH: 33.7 pg (ref 26.0–34.0)
MCHC: 33.9 g/dL (ref 30.0–36.0)
MCV: 99.3 fL (ref 80.0–100.0)
Platelets: 332 10*3/uL (ref 150–400)
RBC: 2.97 MIL/uL — ABNORMAL LOW (ref 3.87–5.11)
RDW: 14.4 % (ref 11.5–15.5)
WBC: 11.3 10*3/uL — ABNORMAL HIGH (ref 4.0–10.5)
nRBC: 0.3 % — ABNORMAL HIGH (ref 0.0–0.2)

## 2021-04-17 LAB — RESP PANEL BY RT-PCR (FLU A&B, COVID) ARPGX2
Influenza A by PCR: NEGATIVE
Influenza B by PCR: NEGATIVE
SARS Coronavirus 2 by RT PCR: NEGATIVE

## 2021-04-17 LAB — LIPASE, BLOOD: Lipase: 44 U/L (ref 11–51)

## 2021-04-17 MED ORDER — SODIUM CHLORIDE 0.9 % IV BOLUS
1000.0000 mL | Freq: Once | INTRAVENOUS | Status: AC
  Administered 2021-04-17: 20:00:00 1000 mL via INTRAVENOUS

## 2021-04-17 MED ORDER — ONDANSETRON HCL 4 MG/2ML IJ SOLN
4.0000 mg | Freq: Once | INTRAMUSCULAR | Status: AC
  Administered 2021-04-17: 20:00:00 4 mg via INTRAVENOUS
  Filled 2021-04-17: qty 2

## 2021-04-17 MED ORDER — IOHEXOL 350 MG/ML SOLN
100.0000 mL | Freq: Once | INTRAVENOUS | Status: AC | PRN
  Administered 2021-04-17: 21:00:00 100 mL via INTRAVENOUS

## 2021-04-17 MED ORDER — IPRATROPIUM-ALBUTEROL 0.5-2.5 (3) MG/3ML IN SOLN
3.0000 mL | Freq: Once | RESPIRATORY_TRACT | Status: AC
  Administered 2021-04-17: 20:00:00 3 mL via RESPIRATORY_TRACT
  Filled 2021-04-17: qty 3

## 2021-04-17 NOTE — Discharge Instructions (Addendum)
Please follow primary care doctor for ultrasound of your liver
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
## 2021-04-17 NOTE — ED Notes (Signed)
Pt ambulated well with cane. Pt and daughter states she is much weaker than normal.

## 2021-04-17 NOTE — ED Triage Notes (Signed)
Lower chest pain, n/v, shob since this afternoon. Denies pain radiation, fevers.

## 2021-04-21 DIAGNOSIS — R634 Abnormal weight loss: Secondary | ICD-10-CM | POA: Diagnosis not present

## 2021-04-21 DIAGNOSIS — Z9889 Other specified postprocedural states: Secondary | ICD-10-CM | POA: Diagnosis not present

## 2021-04-21 DIAGNOSIS — R1310 Dysphagia, unspecified: Secondary | ICD-10-CM | POA: Diagnosis not present

## 2021-04-21 DIAGNOSIS — R112 Nausea with vomiting, unspecified: Secondary | ICD-10-CM | POA: Diagnosis not present

## 2021-04-21 DIAGNOSIS — I10 Essential (primary) hypertension: Secondary | ICD-10-CM | POA: Diagnosis not present

## 2021-04-21 DIAGNOSIS — Z8719 Personal history of other diseases of the digestive system: Secondary | ICD-10-CM | POA: Diagnosis not present

## 2021-04-28 ENCOUNTER — Ambulatory Visit: Payer: Medicare PPO | Admitting: Gastroenterology

## 2021-04-28 ENCOUNTER — Encounter: Payer: Self-pay | Admitting: Gastroenterology

## 2021-04-28 VITALS — BP 124/60 | HR 68 | Ht 61.0 in | Wt 140.4 lb

## 2021-04-28 DIAGNOSIS — R059 Cough, unspecified: Secondary | ICD-10-CM | POA: Diagnosis not present

## 2021-04-28 DIAGNOSIS — R1310 Dysphagia, unspecified: Secondary | ICD-10-CM

## 2021-04-28 DIAGNOSIS — K219 Gastro-esophageal reflux disease without esophagitis: Secondary | ICD-10-CM

## 2021-04-28 MED ORDER — OMEPRAZOLE 40 MG PO CPDR: 40.0000 mg | DELAYED_RELEASE_CAPSULE | Freq: Two times a day (BID) | ORAL | 11 refills | Status: AC

## 2021-04-28 NOTE — Patient Instructions (Signed)
Please make an appointment with your pulmonologist.   Please remain on a full liquid diet including Ensure and Boost as supplements.   We have sent the following medications to your pharmacy for you to pick up at your convenience: Omeprazole 40 mg twice daily.   You have been scheduled for a Barium Esophogram at [facility] admitting on 05/07/21 at 10:30am. Please arrive 30 minutes prior to your appointment for registration. Make certain not to have anything to eat or drink 3 hours prior to your test. If you need to reschedule for any reason, please contact radiology at [phone] to do so. __________________________________________________________ A barium swallow is an examination that concentrates on views of the esophagus. This tends to be a double contrast exam (barium and two liquids which, when combined, create a gas to distend the wall of the oesophagus) or single contrast (non-ionic iodine based). The study is usually tailored to your symptoms so a good history is essential. Attention is paid during the study to the form, structure and configuration of the esophagus, looking for functional disorders (such as aspiration, dysphagia, achalasia, motility and reflux) EXAMINATION You may be asked to change into a gown, depending on the type of swallow being performed. A radiologist and radiographer will perform the procedure. The radiologist will advise you of the type of contrast selected for your procedure and direct you during the exam. You will be asked to stand, sit or lie in several different positions and to hold a small amount of fluid in your mouth before being asked to swallow while the imaging is performed .In some instances you may be asked to swallow barium coated marshmallows to assess the motility of a solid food bolus. The exam can be recorded as a digital or video fluoroscopy procedure. POST PROCEDURE It will take 1-2 days for the barium to pass through your system. To facilitate  this, it is important, unless otherwise directed, to increase your fluids for the next 24-48hrs and to resume your normal diet.  This test typically takes about 30 minutes to perform. __________________________________________________________You have been scheduled for an endoscopy. Please follow written instructions given to you at your visit today. If you use inhalers (even only as needed), please bring them with you on the day of your procedure.  The [place] [facility] providers would like to encourage you to use [facility] to communicate with providers for non-urgent requests or questions.  Due to long hold times on the telephone, sending your provider a message by [facility] may be a faster and more efficient way to get a response.  Please allow 48 business hours for a response.  Please remember that this is for non-urgent requests.   Due to recent changes in healthcare laws, you may see the results of your imaging and laboratory studies on MyChart before your provider has had a chance to review them.  We understand that in some cases there may be results that are confusing or concerning to you. Not all laboratory results come back in the same time frame and the provider may be waiting for multiple results in order to interpret others.  Please give [place] 48 hours in order for your provider to thoroughly review all the results before contacting the office for clarification of your results.   Thank you for choosing me and [facility].  Pricilla Riffle. Dagoberto Ligas., MD., [facility]  Full Liquid Diet A full liquid diet refers to fluids and foods that are liquid, or will become liquid, at room temperature.  This diet should only be used for a short period of time to help you recover from illness or surgery. Your health care provider or dietitian will help determine when it is safe to eat regular foods again. What are tips for following this plan? Reading food labels Check food labels of nutrition shakes for the  amount of protein. Look for nutrition shakes that have at least 8-10 grams of protein in each serving. Choose drinks, such as milks and juices, that are "fortified" or "enriched." This means that vitamins and minerals have been added. Shopping Buy pre-made nutrition shakes to keep on hand. To vary your choices, buy different flavors of milks and shakes. Meal planning Choose flavors and foods that you enjoy. To make sure you get enough energy and calories from food: Have three full liquid meals each day. Have a liquid snack between each meal. Drink 6-8 oz (177-237 mL) of a nutritional supplement shake with meals or as snacks. Add protein powder, powdered milk, milk, or yogurt to shakes to increase the amount of protein. Drink at least one serving a day of citrus fruit juice or fruit juice that has vitamin C added. General guidelines Before starting the full liquid diet, check with your health care provider to know what foods you should avoid. These may include full-fat or high-fiber liquids. You may have any liquid or food that becomes a liquid at room temperature. The food is considered a liquid if it can be poured off a spoon at room temperature. Do not drink alcohol unless approved by your health care provider. This diet gives you most of the nutrients that you need for energy, but you may not get enough of certain vitamins, minerals, and fiber. Make sure to talk to your health care provider or dietitian about: How many calories you need to eat each day. How much fluid you should have each day. Taking a multivitamin or a nutritional supplement. What foods should I eat? Fruits Fruit juice without pulp. Strained fruit pures (seeds and skins removed). Vegetables Pulp-free tomato or vegetable juice. Vegetables pured in soup. Grains Thin, hot cereal, such as farina. Soft-cooked pasta or rice pured in soup. Meats and other proteins Beef, chicken, and fish broths. Powdered protein  supplements. Dairy Milk and milk-based beverages, including milk shakes and instant breakfast mixes. Smooth yogurt. Pured cottage cheese. Fats and oils Melted margarine and butter. Cream. Canola, almond, avocado, corn, grapeseed, sunflower, and sesame oils. Gravy. Beverages Water. Coffee and tea (caffeinated or decaffeinated). Cocoa. Liquid nutritional supplements. Soft drinks. Nondairy milks, such as almond, coconut, rice, or soy milk. Sweets and desserts Custard. Pudding. Flavored gelatin. Smooth ice cream (without nuts or candy pieces). Sherbet. Frozen ice pops. [place] ice. Pudding pops. Seasonings and condiments Salt and pepper. Spices. Vinegar. Ketchup. Yellow mustard. Smooth sauces, such as Hollandaise, cheese sauce, or white sauce. Soy sauce. Syrup. Honey. Jelly (without fruit pieces). Other foods Cocoa powder. Cream soups. Strained soups. The items listed above may not be a complete list of foods and beverages you can eat. Contact a dietitian for more information. What foods should I avoid? Fruits All whole fresh, frozen, or canned fruits. Vegetables All whole fresh, frozen, or canned vegetables. Grains Whole grains. Pasta. Rice. Cold cereal. Bread. Crackers. Meats and other proteins All cuts of meat, poultry, and fish. Eggs. Tofu and soy protein. Nuts and nut butters. Precooked or cured meat, such as sausages or meat loaves. Dairy Hard cheese. Yogurt with fruit chunks. Fats and oils Coconut oil. [employer]  oil. Lard. Cold butter. Sweets and desserts Ice cream or other frozen desserts that contain solids, such as nuts, chocolate chips, and pieces of cookies. Cakes. Cookies. Candy. Seasonings and condiments Stone-ground mustard. Other foods Soups with chunks or pieces. The items listed above may not be a complete list of foods and beverages you should avoid. Contact a dietitian for more information. Summary A full liquid diet refers to fluids and foods that are liquid or will  become liquid at room temperature. This diet should only be used for a short period of time to help you recover from illness or surgery. Ask your health care provider or dietitian when it is safe for you to eat regular foods. To make sure you get enough calories and nutrients, eat three meals each day with snacks in between. Drink pre-made nutritional supplement shakes or add protein powder to homemade shakes. Talk to your health care provider about taking a vitamin and mineral supplement. This information is not intended to replace advice given to you by your health care provider. Make sure you discuss any questions you have with your health care provider. Document Revised: 01/22/2020 Document Reviewed: 01/22/2020 Elsevier Patient Education  2022 [employer].

## 2021-04-28 NOTE — Progress Notes (Signed)
° ° °  History of Present Illness: This is an 87 year old female complaining of nausea, vomiting, difficulty swallowing, weakness, fatigue, poor appetite, cough.  She is accompanied by her daughter and husband.  She is status post paraesophageal hernia repair, robotic assisted laparotomy with a chronic organoaxial volvulus in January 2022.  Since her surgery she has had intermittent problems with nausea vomiting difficulty swallowing and poor appetite.  She was evaluated in March 2022 for these symptoms.  Barium esophagram on June 25, 2020 showed no evidence of a recurrent hiatal hernia, moderate to marked gastroesophageal reflux and findings suggestive of a peptic stricture in the lower esophagus. Her symptoms gradually improved over a few months and other than being careful with taking very small pieces of solid food her symptoms had been under better control.  Her symptoms worsened in December and have now include a worsening cough with phlegm, weakness and fatigue.  She was evaluated in the ED on 12/30. CT AP showed ectomy, stable dilation of the extrahepatic bile duct, pneumobilia, enlarging 19 mm cystic lesion in the right hepatic lobe, sigmoid diverticulosis, right hip arthritis with a small right hip effusion. CT angio chest showed mild diffuse interstitial thickening throughout both lungs, evidence of surgical repair of a large hiatal hernia noted on prior study, small hepatic hemangioma, prior cholecystectomy and aortic atherosclerosis.  Current Medications, Allergies, Past Medical History, Past Surgical History, Family History and Social History were reviewed in [employer] record.   Physical Exam: General: Well developed, well nourished, elderly, fatigued appearing, in a wheelchair  Head: Normocephalic and atraumatic Eyes: Sclerae anicteric, EOMI Ears: Normal auditory acuity Mouth: Not examined, mask on during Covid-19 pandemic Lungs: Clear throughout to  auscultation Heart: Regular rate and rhythm; no murmurs, rubs or bruits Abdomen: Soft, non tender and non distended. No masses, hepatosplenomegaly or hernias noted. Normal Bowel sounds Rectal: Not done Musculoskeletal: Symmetrical with no gross deformities  Pulses:  Normal pulses noted Extremities: No clubbing, cyanosis, edema or deformities noted Neurological: Alert oriented x 4, grossly nonfocal Psychological:  Alert and cooperative. Normal mood and affect   Assessment and Recommendations:  GERD, dysphagia, nausea, vomiting.  Suspected GERD with esophageal stricture as noted on prior barium esophagram.  Increase omeprazole to 40 mg p.o. twice daily.  Follow antireflux measures.  Continue metoclopramide 5 mg p.o. 3 times daily 30 to 60 minutes before meals. Continue promethazine 12.5 mg to 25 mg po bid prn N/V.  Change to a full liquid diet with Ensure supplements.  Schedule barium esophagram.  If an esophageal stricture is again noted we discussed EGD with dilation.  They are appropriately concerned about procedures and sedation given her age and frail condition but are agreeable to proceed if a stricture is found in the process. The risks (including bleeding, perforation, infection, missed lesions, medication reactions and possible hospitalization or surgery if complications occur), benefits, and alternatives to endoscopy with possible biopsy and possible dilation were discussed with the patient and they consent to proceed.   S/P paraesophageal hernia repair, robotic assisted laparotomy with a chronic organoaxial volvulus in January 2022. Chronic asthmatic bronchitis, worsening cough with phlegm, fatigue, weakness.  Follow-up with pulmonary for further evaluation prior to EGD if needed.

## 2021-04-30 ENCOUNTER — Inpatient Hospital Stay (HOSPITAL_COMMUNITY)
Admission: EM | Admit: 2021-04-30 | Discharge: 2021-05-16 | DRG: 308 | Disposition: A | Discharge status: Hospice | Payer: Medicare PPO | Attending: Internal Medicine | Admitting: Internal Medicine

## 2021-04-30 ENCOUNTER — Emergency Department (HOSPITAL_COMMUNITY): Payer: Medicare PPO

## 2021-04-30 ENCOUNTER — Other Ambulatory Visit: Payer: Self-pay

## 2021-04-30 ENCOUNTER — Encounter (HOSPITAL_COMMUNITY): Payer: Self-pay | Admitting: Emergency Medicine

## 2021-04-30 DIAGNOSIS — I472 Ventricular tachycardia, unspecified: Secondary | ICD-10-CM

## 2021-04-30 DIAGNOSIS — J90 Pleural effusion, not elsewhere classified: Secondary | ICD-10-CM | POA: Diagnosis not present

## 2021-04-30 DIAGNOSIS — I5189 Other ill-defined heart diseases: Secondary | ICD-10-CM | POA: Diagnosis not present

## 2021-04-30 DIAGNOSIS — J189 Pneumonia, unspecified organism: Secondary | ICD-10-CM

## 2021-04-30 DIAGNOSIS — E1165 Type 2 diabetes mellitus with hyperglycemia: Secondary | ICD-10-CM | POA: Diagnosis not present

## 2021-04-30 DIAGNOSIS — R112 Nausea with vomiting, unspecified: Secondary | ICD-10-CM | POA: Diagnosis not present

## 2021-04-30 DIAGNOSIS — I4721 Torsades de pointes: Secondary | ICD-10-CM | POA: Diagnosis not present

## 2021-04-30 DIAGNOSIS — Z20822 Contact with and (suspected) exposure to covid-19: Secondary | ICD-10-CM | POA: Diagnosis not present

## 2021-04-30 DIAGNOSIS — B370 Candidal stomatitis: Secondary | ICD-10-CM | POA: Diagnosis not present

## 2021-04-30 DIAGNOSIS — I130 Hypertensive heart and chronic kidney disease with heart failure and stage 1 through stage 4 chronic kidney disease, or unspecified chronic kidney disease: Secondary | ICD-10-CM | POA: Diagnosis not present

## 2021-04-30 DIAGNOSIS — K2100 Gastro-esophageal reflux disease with esophagitis, without bleeding: Secondary | ICD-10-CM | POA: Diagnosis present

## 2021-04-30 DIAGNOSIS — R0689 Other abnormalities of breathing: Secondary | ICD-10-CM | POA: Diagnosis not present

## 2021-04-30 DIAGNOSIS — I10 Essential (primary) hypertension: Secondary | ICD-10-CM | POA: Diagnosis present

## 2021-04-30 DIAGNOSIS — J9610 Chronic respiratory failure, unspecified whether with hypoxia or hypercapnia: Secondary | ICD-10-CM | POA: Diagnosis not present

## 2021-04-30 DIAGNOSIS — I469 Cardiac arrest, cause unspecified: Secondary | ICD-10-CM | POA: Diagnosis present

## 2021-04-30 DIAGNOSIS — J45909 Unspecified asthma, uncomplicated: Secondary | ICD-10-CM | POA: Diagnosis not present

## 2021-04-30 DIAGNOSIS — E46 Unspecified protein-calorie malnutrition: Secondary | ICD-10-CM | POA: Diagnosis present

## 2021-04-30 DIAGNOSIS — N390 Urinary tract infection, site not specified: Secondary | ICD-10-CM | POA: Diagnosis not present

## 2021-04-30 DIAGNOSIS — R634 Abnormal weight loss: Secondary | ICD-10-CM | POA: Diagnosis not present

## 2021-04-30 DIAGNOSIS — Z431 Encounter for attention to gastrostomy: Secondary | ICD-10-CM | POA: Diagnosis not present

## 2021-04-30 DIAGNOSIS — Z9981 Dependence on supplemental oxygen: Secondary | ICD-10-CM

## 2021-04-30 DIAGNOSIS — R0600 Dyspnea, unspecified: Secondary | ICD-10-CM | POA: Diagnosis not present

## 2021-04-30 DIAGNOSIS — Z8719 Personal history of other diseases of the digestive system: Secondary | ICD-10-CM | POA: Diagnosis not present

## 2021-04-30 DIAGNOSIS — Z66 Do not resuscitate: Secondary | ICD-10-CM | POA: Diagnosis present

## 2021-04-30 DIAGNOSIS — J9690 Respiratory failure, unspecified, unspecified whether with hypoxia or hypercapnia: Secondary | ICD-10-CM | POA: Diagnosis not present

## 2021-04-30 DIAGNOSIS — M7989 Other specified soft tissue disorders: Secondary | ICD-10-CM | POA: Diagnosis not present

## 2021-04-30 DIAGNOSIS — K224 Dyskinesia of esophagus: Secondary | ICD-10-CM | POA: Diagnosis present

## 2021-04-30 DIAGNOSIS — J9611 Chronic respiratory failure with hypoxia: Secondary | ICD-10-CM | POA: Diagnosis present

## 2021-04-30 DIAGNOSIS — I517 Cardiomegaly: Secondary | ICD-10-CM | POA: Diagnosis not present

## 2021-04-30 DIAGNOSIS — R42 Dizziness and giddiness: Secondary | ICD-10-CM | POA: Diagnosis not present

## 2021-04-30 DIAGNOSIS — Z7982 Long term (current) use of aspirin: Secondary | ICD-10-CM

## 2021-04-30 DIAGNOSIS — R54 Age-related physical debility: Secondary | ICD-10-CM | POA: Diagnosis present

## 2021-04-30 DIAGNOSIS — I4510 Unspecified right bundle-branch block: Secondary | ICD-10-CM | POA: Diagnosis present

## 2021-04-30 DIAGNOSIS — N182 Chronic kidney disease, stage 2 (mild): Secondary | ICD-10-CM | POA: Diagnosis present

## 2021-04-30 DIAGNOSIS — E875 Hyperkalemia: Secondary | ICD-10-CM | POA: Diagnosis not present

## 2021-04-30 DIAGNOSIS — I4729 Other ventricular tachycardia: Secondary | ICD-10-CM

## 2021-04-30 DIAGNOSIS — G928 Other toxic encephalopathy: Secondary | ICD-10-CM | POA: Diagnosis not present

## 2021-04-30 DIAGNOSIS — R0989 Other specified symptoms and signs involving the circulatory and respiratory systems: Secondary | ICD-10-CM

## 2021-04-30 DIAGNOSIS — Z85820 Personal history of malignant melanoma of skin: Secondary | ICD-10-CM

## 2021-04-30 DIAGNOSIS — Z9889 Other specified postprocedural states: Secondary | ICD-10-CM

## 2021-04-30 DIAGNOSIS — Z882 Allergy status to sulfonamides status: Secondary | ICD-10-CM

## 2021-04-30 DIAGNOSIS — R1310 Dysphagia, unspecified: Secondary | ICD-10-CM

## 2021-04-30 DIAGNOSIS — R3129 Other microscopic hematuria: Secondary | ICD-10-CM | POA: Diagnosis not present

## 2021-04-30 DIAGNOSIS — R55 Syncope and collapse: Secondary | ICD-10-CM | POA: Diagnosis not present

## 2021-04-30 DIAGNOSIS — I4891 Unspecified atrial fibrillation: Secondary | ICD-10-CM | POA: Insufficient documentation

## 2021-04-30 DIAGNOSIS — K222 Esophageal obstruction: Secondary | ICD-10-CM | POA: Diagnosis present

## 2021-04-30 DIAGNOSIS — L8990 Pressure ulcer of unspecified site, unspecified stage: Secondary | ICD-10-CM | POA: Diagnosis present

## 2021-04-30 DIAGNOSIS — Z888 Allergy status to other drugs, medicaments and biological substances status: Secondary | ICD-10-CM

## 2021-04-30 DIAGNOSIS — J811 Chronic pulmonary edema: Secondary | ICD-10-CM | POA: Diagnosis not present

## 2021-04-30 DIAGNOSIS — R627 Adult failure to thrive: Secondary | ICD-10-CM | POA: Diagnosis present

## 2021-04-30 DIAGNOSIS — R8271 Bacteriuria: Secondary | ICD-10-CM | POA: Diagnosis not present

## 2021-04-30 DIAGNOSIS — J9811 Atelectasis: Secondary | ICD-10-CM | POA: Diagnosis present

## 2021-04-30 DIAGNOSIS — R918 Other nonspecific abnormal finding of lung field: Secondary | ICD-10-CM | POA: Diagnosis not present

## 2021-04-30 DIAGNOSIS — B9620 Unspecified Escherichia coli [E. coli] as the cause of diseases classified elsewhere: Secondary | ICD-10-CM | POA: Diagnosis present

## 2021-04-30 DIAGNOSIS — J918 Pleural effusion in other conditions classified elsewhere: Secondary | ICD-10-CM | POA: Diagnosis not present

## 2021-04-30 DIAGNOSIS — J449 Chronic obstructive pulmonary disease, unspecified: Secondary | ICD-10-CM | POA: Diagnosis present

## 2021-04-30 DIAGNOSIS — K449 Diaphragmatic hernia without obstruction or gangrene: Secondary | ICD-10-CM | POA: Diagnosis present

## 2021-04-30 DIAGNOSIS — Z7189 Other specified counseling: Secondary | ICD-10-CM | POA: Diagnosis not present

## 2021-04-30 DIAGNOSIS — E871 Hypo-osmolality and hyponatremia: Secondary | ICD-10-CM | POA: Diagnosis not present

## 2021-04-30 DIAGNOSIS — E876 Hypokalemia: Secondary | ICD-10-CM | POA: Diagnosis present

## 2021-04-30 DIAGNOSIS — D539 Nutritional anemia, unspecified: Secondary | ICD-10-CM | POA: Diagnosis present

## 2021-04-30 DIAGNOSIS — D509 Iron deficiency anemia, unspecified: Secondary | ICD-10-CM | POA: Diagnosis not present

## 2021-04-30 DIAGNOSIS — I809 Phlebitis and thrombophlebitis of unspecified site: Secondary | ICD-10-CM | POA: Diagnosis present

## 2021-04-30 DIAGNOSIS — I480 Paroxysmal atrial fibrillation: Secondary | ICD-10-CM | POA: Diagnosis present

## 2021-04-30 DIAGNOSIS — N179 Acute kidney failure, unspecified: Secondary | ICD-10-CM | POA: Diagnosis not present

## 2021-04-30 DIAGNOSIS — K219 Gastro-esophageal reflux disease without esophagitis: Secondary | ICD-10-CM | POA: Diagnosis not present

## 2021-04-30 DIAGNOSIS — E1122 Type 2 diabetes mellitus with diabetic chronic kidney disease: Secondary | ICD-10-CM | POA: Diagnosis present

## 2021-04-30 DIAGNOSIS — Z8249 Family history of ischemic heart disease and other diseases of the circulatory system: Secondary | ICD-10-CM

## 2021-04-30 DIAGNOSIS — I5032 Chronic diastolic (congestive) heart failure: Secondary | ICD-10-CM | POA: Diagnosis not present

## 2021-04-30 DIAGNOSIS — Z931 Gastrostomy status: Secondary | ICD-10-CM

## 2021-04-30 DIAGNOSIS — R1110 Vomiting, unspecified: Secondary | ICD-10-CM | POA: Diagnosis not present

## 2021-04-30 DIAGNOSIS — R0789 Other chest pain: Secondary | ICD-10-CM | POA: Diagnosis not present

## 2021-04-30 DIAGNOSIS — R079 Chest pain, unspecified: Secondary | ICD-10-CM | POA: Diagnosis not present

## 2021-04-30 DIAGNOSIS — Z6827 Body mass index (BMI) 27.0-27.9, adult: Secondary | ICD-10-CM

## 2021-04-30 DIAGNOSIS — E538 Deficiency of other specified B group vitamins: Secondary | ICD-10-CM | POA: Diagnosis present

## 2021-04-30 DIAGNOSIS — I4901 Ventricular fibrillation: Principal | ICD-10-CM

## 2021-04-30 DIAGNOSIS — Z79899 Other long term (current) drug therapy: Secondary | ICD-10-CM

## 2021-04-30 DIAGNOSIS — E860 Dehydration: Secondary | ICD-10-CM | POA: Diagnosis present

## 2021-04-30 DIAGNOSIS — R1314 Dysphagia, pharyngoesophageal phase: Secondary | ICD-10-CM | POA: Diagnosis present

## 2021-04-30 DIAGNOSIS — Z515 Encounter for palliative care: Secondary | ICD-10-CM | POA: Diagnosis not present

## 2021-04-30 DIAGNOSIS — R000 Tachycardia, unspecified: Secondary | ICD-10-CM | POA: Diagnosis not present

## 2021-04-30 DIAGNOSIS — I2609 Other pulmonary embolism with acute cor pulmonale: Secondary | ICD-10-CM | POA: Diagnosis not present

## 2021-04-30 DIAGNOSIS — E559 Vitamin D deficiency, unspecified: Secondary | ICD-10-CM | POA: Diagnosis present

## 2021-04-30 DIAGNOSIS — R110 Nausea: Secondary | ICD-10-CM

## 2021-04-30 DIAGNOSIS — R531 Weakness: Secondary | ICD-10-CM | POA: Diagnosis not present

## 2021-04-30 LAB — TROPONIN I (HIGH SENSITIVITY)
Troponin I (High Sensitivity): 22 ng/L — ABNORMAL HIGH (ref <18)
Troponin I (High Sensitivity): 29 ng/L — ABNORMAL HIGH (ref <18)

## 2021-04-30 LAB — COMPREHENSIVE METABOLIC PANEL
ALT: 15 U/L (ref 0–44)
AST: 30 U/L (ref 15–41)
Albumin: 3.2 g/dL — ABNORMAL LOW (ref 3.5–5.0)
Alkaline Phosphatase: 202 U/L — ABNORMAL HIGH (ref 38–126)
Anion gap: 17 — ABNORMAL HIGH (ref 5–15)
BUN: 35 mg/dL — ABNORMAL HIGH (ref 8–23)
CO2: 21 mmol/L — ABNORMAL LOW (ref 22–32)
Calcium: 8.7 mg/dL — ABNORMAL LOW (ref 8.9–10.3)
Chloride: 99 mmol/L (ref 98–111)
Creatinine, Ser: 0.94 mg/dL (ref 0.44–1.00)
GFR, Estimated: 60 mL/min — ABNORMAL LOW (ref >60)
Glucose, Bld: 153 mg/dL — ABNORMAL HIGH (ref 70–99)
Potassium: 4.2 mmol/L (ref 3.5–5.1)
Sodium: 137 mmol/L (ref 135–145)
Total Bilirubin: 2.8 mg/dL — ABNORMAL HIGH (ref 0.3–1.2)
Total Protein: 7.3 g/dL (ref 6.5–8.1)

## 2021-04-30 LAB — CBC WITH DIFFERENTIAL/PLATELET
Abs Immature Granulocytes: 0.12 10*3/uL — ABNORMAL HIGH (ref 0.00–0.07)
Basophils Absolute: 0 10*3/uL (ref 0.0–0.1)
Basophils Relative: 1 %
Eosinophils Absolute: 0 10*3/uL (ref 0.0–0.5)
Eosinophils Relative: 0 %
HCT: 29.5 % — ABNORMAL LOW (ref 36.0–46.0)
Hemoglobin: 9.8 g/dL — ABNORMAL LOW (ref 12.0–15.0)
Immature Granulocytes: 2 %
Lymphocytes Relative: 8 %
Lymphs Abs: 0.5 10*3/uL — ABNORMAL LOW (ref 0.7–4.0)
MCH: 32.6 pg (ref 26.0–34.0)
MCHC: 33.2 g/dL (ref 30.0–36.0)
MCV: 98 fL (ref 80.0–100.0)
Monocytes Absolute: 0.3 10*3/uL (ref 0.1–1.0)
Monocytes Relative: 5 %
Neutro Abs: 5.5 10*3/uL (ref 1.7–7.7)
Neutrophils Relative %: 84 %
Platelets: 443 10*3/uL — ABNORMAL HIGH (ref 150–400)
RBC: 3.01 MIL/uL — ABNORMAL LOW (ref 3.87–5.11)
RDW: 14.5 % (ref 11.5–15.5)
WBC: 6.5 10*3/uL (ref 4.0–10.5)
nRBC: 0 % (ref 0.0–0.2)

## 2021-04-30 LAB — URINALYSIS, ROUTINE W REFLEX MICROSCOPIC
Bilirubin Urine: NEGATIVE
Glucose, UA: NEGATIVE mg/dL
Ketones, ur: 20 mg/dL — AB
Leukocytes,Ua: NEGATIVE
Nitrite: NEGATIVE
Protein, ur: >=300 mg/dL — AB
RBC / HPF: >50 RBC/hpf — ABNORMAL HIGH (ref 0–5)
Specific Gravity, Urine: 1.015 (ref 1.005–1.030)
pH: 5 (ref 5.0–8.0)

## 2021-04-30 LAB — LIPASE, BLOOD: Lipase: 28 U/L (ref 11–51)

## 2021-04-30 LAB — CBG MONITORING, ED: Glucose-Capillary: 157 mg/dL — ABNORMAL HIGH (ref 70–99)

## 2021-04-30 LAB — RESP PANEL BY RT-PCR (FLU A&B, COVID) ARPGX2
Influenza A by PCR: NEGATIVE
Influenza B by PCR: NEGATIVE
SARS Coronavirus 2 by RT PCR: NEGATIVE

## 2021-04-30 MED ORDER — AMIODARONE LOAD VIA INFUSION
150.0000 mg | Freq: Once | INTRAVENOUS | Status: AC
  Administered 2021-04-30: 150 mg via INTRAVENOUS
  Filled 2021-04-30: qty 83.34

## 2021-04-30 MED ORDER — ENOXAPARIN SODIUM 40 MG/0.4ML IJ SOSY
40.0000 mg | PREFILLED_SYRINGE | INTRAMUSCULAR | Status: DC
  Administered 2021-05-01 – 2021-05-02 (×2): 40 mg via SUBCUTANEOUS
  Filled 2021-04-30 (×2): qty 0.4

## 2021-04-30 MED ORDER — ASPIRIN EC 81 MG PO TBEC
81.0000 mg | DELAYED_RELEASE_TABLET | Freq: Every day | ORAL | Status: DC
  Administered 2021-05-01 – 2021-05-07 (×7): 81 mg via ORAL
  Filled 2021-04-30 (×7): qty 1

## 2021-04-30 MED ORDER — POLYETHYLENE GLYCOL 3350 17 G PO PACK: 17.0000 g | PACK | Freq: Every day | ORAL | Status: DC | PRN

## 2021-04-30 MED ORDER — LABETALOL HCL 5 MG/ML IV SOLN
10.0000 mg | INTRAVENOUS | Status: AC | PRN
  Administered 2021-04-30 (×2): 10 mg via INTRAVENOUS
  Filled 2021-04-30 (×2): qty 4

## 2021-04-30 MED ORDER — SODIUM CHLORIDE 0.9% FLUSH
3.0000 mL | Freq: Two times a day (BID) | INTRAVENOUS | Status: DC
  Administered 2021-05-01 – 2021-05-06 (×13): 3 mL via INTRAVENOUS

## 2021-04-30 MED ORDER — AMIODARONE HCL IN DEXTROSE 360-4.14 MG/200ML-% IV SOLN
30.0000 mg/h | INTRAVENOUS | Status: DC
  Administered 2021-05-01 (×2): 30 mg/h via INTRAVENOUS
  Filled 2021-04-30 (×3): qty 200

## 2021-04-30 MED ORDER — AMIODARONE HCL IN DEXTROSE 360-4.14 MG/200ML-% IV SOLN
60.0000 mg/h | INTRAVENOUS | Status: AC
  Administered 2021-04-30 – 2021-05-01 (×2): 60 mg/h via INTRAVENOUS
  Filled 2021-04-30: qty 200

## 2021-04-30 MED ORDER — FLUTICASONE FUROATE-VILANTEROL 200-25 MCG/ACT IN AEPB
1.0000 | INHALATION_SPRAY | Freq: Every day | RESPIRATORY_TRACT | Status: DC
  Filled 2021-04-30: qty 28

## 2021-04-30 MED ORDER — OMEPRAZOLE 20 MG PO CPDR
40.0000 mg | DELAYED_RELEASE_CAPSULE | Freq: Two times a day (BID) | ORAL | Status: DC
  Administered 2021-05-01: 40 mg via ORAL
  Filled 2021-04-30: qty 2

## 2021-04-30 MED ORDER — IRBESARTAN 75 MG PO TABS
37.5000 mg | ORAL_TABLET | Freq: Every day | ORAL | Status: DC
  Administered 2021-05-01: 37.5 mg via ORAL
  Filled 2021-04-30: qty 0.5

## 2021-04-30 MED ORDER — ONDANSETRON HCL 4 MG/2ML IJ SOLN
4.0000 mg | Freq: Once | INTRAMUSCULAR | Status: AC
  Administered 2021-04-30: 4 mg via INTRAVENOUS
  Filled 2021-04-30: qty 2

## 2021-04-30 MED ORDER — CEFTRIAXONE SODIUM 1 G IJ SOLR
1.0000 g | Freq: Once | INTRAMUSCULAR | Status: AC
  Administered 2021-04-30: 1 g via INTRAVENOUS
  Filled 2021-04-30: qty 10

## 2021-04-30 MED ORDER — SODIUM CHLORIDE 0.9 % IV BOLUS
500.0000 mL | Freq: Once | INTRAVENOUS | Status: AC
Start: 2021-04-30 — End: 2021-04-30
  Administered 2021-04-30: 500 mL via INTRAVENOUS

## 2021-04-30 MED ORDER — ACETAMINOPHEN 650 MG RE SUPP: 650.0000 mg | Freq: Four times a day (QID) | RECTAL | Status: DC | PRN

## 2021-04-30 MED ORDER — ASPIRIN 81 MG PO CHEW
CHEWABLE_TABLET | ORAL | Status: AC
  Filled 2021-04-30: qty 1

## 2021-04-30 MED ORDER — VALSARTAN-HYDROCHLOROTHIAZIDE 80-12.5 MG PO TABS: 0.5000 | ORAL_TABLET | Freq: Every day | ORAL | Status: DC

## 2021-04-30 MED ORDER — ASPIRIN 81 MG PO CHEW
CHEWABLE_TABLET | ORAL | Status: AC
  Filled 2021-04-30: qty 4

## 2021-04-30 MED ORDER — PANTOPRAZOLE SODIUM 40 MG PO TBEC: 40.0000 mg | DELAYED_RELEASE_TABLET | Freq: Every day | ORAL | Status: DC

## 2021-04-30 MED ORDER — SODIUM CHLORIDE 0.9 % IV SOLN: 1.0000 g | INTRAVENOUS | Status: DC

## 2021-04-30 MED ORDER — DIPHENHYDRAMINE HCL 25 MG PO CAPS: 25.0000 mg | ORAL_CAPSULE | Freq: Once | ORAL | Status: DC | PRN

## 2021-04-30 MED ORDER — ASPIRIN 325 MG PO TABS
325.0000 mg | ORAL_TABLET | Freq: Once | ORAL | Status: AC
  Administered 2021-04-30: 325 mg via ORAL

## 2021-04-30 MED ORDER — ACETAMINOPHEN 325 MG PO TABS
650.0000 mg | ORAL_TABLET | Freq: Four times a day (QID) | ORAL | Status: DC | PRN
  Administered 2021-05-01 – 2021-05-13 (×6): 650 mg via ORAL
  Filled 2021-04-30 (×6): qty 2

## 2021-04-30 MED ORDER — ALBUTEROL SULFATE (2.5 MG/3ML) 0.083% IN NEBU: 3.0000 mL | INHALATION_SOLUTION | Freq: Four times a day (QID) | RESPIRATORY_TRACT | Status: DC | PRN

## 2021-04-30 MED ORDER — HYDROCHLOROTHIAZIDE 12.5 MG PO TABS: 6.2500 mg | ORAL_TABLET | Freq: Every day | ORAL | Status: DC

## 2021-04-30 NOTE — ED Notes (Signed)
Paged Eulis Foster MD

## 2021-04-30 NOTE — ED Triage Notes (Signed)
Pt here from home with c/o n/v and weakness , family witnessed syncopal episode , pt alert and oriented with ems , cbg 180

## 2021-04-30 NOTE — ED Provider Triage Note (Signed)
Emergency Medicine Provider Triage Evaluation Note  [name] , a 87 y.o. female  was evaluated in triage.  Pt complains of nausea and nonbloody nonbilious emesis for the past several days.  She also complains of diarrhea.  She states that she was having abdominal pain however this is dissipated.  Currently she is complaining of pain underneath her bilateral breasts.  She states that she also feels more short of breath than normal.  She states that she typically wears oxygen at nighttime.  She denies any recent sick contacts.  She does mention she was sitting in her chair today and the next thing she remembers is her daughter asking if she was okay.  She was told by her daughter that she passed out.  She states that she passed out in the chair and did not fall or hit her head.   Review of Systems  Positive: + abd pain, nausea, vomiting, diarrhea, chest pain, SOB, syncope Negative: - fevers, chills, cough, urinary symptom  Physical Exam  BP (!) 190/82 (BP Location: Left Arm)    Pulse 83    Temp 98 F (36.7 C) (Oral)    Resp 18    SpO2 100%  Gen:   Awake, no distress   Resp:  Normal effort  MSK:   Moves extremities without difficulty  Other:    Medical Decision Making  Medically screening exam initiated at 3:06 PM.  Appropriate orders placed.  [name] was informed that the remainder of the evaluation will be completed by another provider, this initial triage assessment does not replace that evaluation, and the importance of remaining in the ED until their evaluation is complete.     Eustaquio Maize, PA-C 04/30/21 [phone]

## 2021-04-30 NOTE — ED Notes (Signed)
I attempted to collect labs twice and was not successful

## 2021-04-30 NOTE — ED Provider Notes (Signed)
[facility] DEPT Provider Note   CSN: [id number] Arrival date & time: 04/30/21  1346     History  Chief Complaint  Patient presents with   Syncope    [name] is a 87 y.o. female.  HPI She presents for evaluation of an episode of syncope while she was with her daughter today.  Patient had been coughing before that, and had a 3-second episode of loss of consciousness.  She was sitting at the time.  After she awoke from that episode, she was "weak."  Because of that, her daughter called EMS who transferred her here for evaluation.  She took her usual morning medicines.  She has ongoing problems with "mucus," which she states comes out of her nose and when she coughs.  She has had periods of choking, and vomiting after eating and taking medications.  Last time she vomited was last night, at which time she vomited her evening omeprazole.  She saw GI, Dr. Fuller Plan recently who has increased her omeprazole to twice a day, and scheduled her for an outpatient barium swallow.  He wants to do this before doing endoscopy, due to her comorbidities.    Home Medications Prior to Admission medications   Medication Sig Start Date End Date Taking? Authorizing Provider  albuterol (VENTOLIN HFA) 108 (90 Base) MCG/ACT inhaler Inhale 2 puffs into the lungs every 6 (six) hours as needed for wheezing or shortness of breath. 09/01/20   Garner Nash, DO  aspirin EC 81 MG tablet Take 81 mg by mouth daily.    [provider]  Calcium Carbonate-Vit D-Min (CALCIUM 1200 PO) Take 1,200 mg by mouth daily. Patient not taking: Reported on 04/28/2021    [provider]  Carboxymethylcellul-Glycerin (CLEAR EYES FOR DRY EYES OP) Place 1 drop into both eyes 2 (two) times daily as needed (dry eyes). Unknown strenght    [provider]  Cholecalciferol (VITAMIN D) 50 MCG (2000 UT) tablet Take 2,000 Units by mouth daily. Patient not taking: Reported on 04/28/2021     [provider]  denosumab (PROLIA) 60 MG/ML SOSY injection Inject 60 mg into the skin every 6 (six) months.    [provider]  ferrous sulfate 325 (65 FE) MG tablet Take 325 mg by mouth daily with breakfast. Patient not taking: Reported on 04/28/2021    [provider]  fluticasone furoate-vilanterol (BREO ELLIPTA) 200-25 MCG/INH AEPB Inhale 1 puff into the lungs daily. 09/01/20   Icard, Octavio Graves, DO  metoCLOPramide (REGLAN) 5 MG tablet Take 5 mg by mouth 3 (three) times daily before meals.    [provider]  omeprazole (PRILOSEC) 40 MG capsule Take 1 capsule (40 mg total) by mouth in the morning and at bedtime. 04/28/21   Ladene Artist, MD  potassium chloride (KLOR-CON) 10 MEQ tablet Take 5 mEq by mouth 2 (two) times daily.    [provider]  promethazine (PHENERGAN) 25 MG tablet Take 1 tablet by mouth every 12 (twelve) hours as needed for refractory nausea / vomiting.    [provider]  silver sulfADIAZINE (SILVADENE) 1 % cream Apply 1 application topically in the morning and at bedtime. 07/15/20   [provider]  valsartan-hydrochlorothiazide (DIOVAN-HCT) 80-12.5 MG tablet Take 0.5 tablets by mouth daily.    [provider]      Allergies    Ciprofibrate, Codeine, Erythromycin, Metronidazole, Other, Pantoprazole, Sulfamethoxazole, Tramadol hcl, and Amlodipine    Review of Systems   Review  of Systems  All other systems reviewed and are negative.  Physical Exam Updated Vital Signs BP (!) 184/80    Pulse 70    Temp 98 F (36.7 C) (Oral)    Resp 20    SpO2 100%  Physical Exam Vitals and nursing note reviewed.  Constitutional:      General: She is not in acute distress.    Appearance: She is well-developed. She is not ill-appearing, toxic-appearing or diaphoretic.  HENT:     Head: Normocephalic and atraumatic.     Right Ear: External ear normal.     Left Ear: External ear normal.     Mouth/Throat:     Mouth:  Mucous membranes are moist.     Pharynx: No oropharyngeal exudate or posterior oropharyngeal erythema.  Eyes:     Conjunctiva/sclera: Conjunctivae normal.     Pupils: Pupils are equal, round, and reactive to light.  Neck:     Trachea: Phonation normal.  Cardiovascular:     Rate and Rhythm: Normal rate and regular rhythm.     Heart sounds: Normal heart sounds.    No friction rub.  Pulmonary:     Effort: Pulmonary effort is normal. No respiratory distress.     Breath sounds: Normal breath sounds. No stridor. No wheezing or rhonchi.  Abdominal:     General: There is no distension.     Palpations: Abdomen is soft.     Tenderness: There is no abdominal tenderness.  Musculoskeletal:        General: No swelling or tenderness. Normal range of motion.     Cervical back: Normal range of motion and neck supple.  Skin:    General: Skin is warm and dry.     Coloration: Skin is not jaundiced or pale.  Neurological:     Mental Status: She is alert and oriented to person, place, and time.     Cranial Nerves: No cranial nerve deficit.     Sensory: No sensory deficit.     Motor: No abnormal muscle tone.     Coordination: Coordination normal.  Psychiatric:        Mood and Affect: Mood normal.        Behavior: Behavior normal.        Thought Content: Thought content normal.        Judgment: Judgment normal.    ED Results / Procedures / Treatments   Labs (all labs ordered are listed, but only abnormal results are displayed) Labs Reviewed  COMPREHENSIVE METABOLIC PANEL - Abnormal; Notable for the following components:      Result Value   CO2 21 (*)    Glucose, Bld 153 (*)    BUN 35 (*)    Calcium 8.7 (*)    Albumin 3.2 (*)    Alkaline Phosphatase 202 (*)    Total Bilirubin 2.8 (*)    GFR, Estimated 60 (*)    Anion gap 17 (*)    All other components within normal limits  CBC WITH DIFFERENTIAL/PLATELET - Abnormal; Notable for the following components:   RBC 3.01 (*)    Hemoglobin 9.8  (*)    HCT 29.5 (*)    Platelets 443 (*)    Lymphs Abs 0.5 (*)    Abs Immature Granulocytes 0.12 (*)    All other components within normal limits  URINALYSIS, ROUTINE W REFLEX MICROSCOPIC - Abnormal; Notable for the following components:   APPearance HAZY (*)    Hgb urine dipstick LARGE (*)  Ketones, ur 20 (*)    Protein, ur >=300 (*)    RBC / HPF >50 (*)    Bacteria, UA MANY (*)    All other components within normal limits  CBG MONITORING, ED - Abnormal; Notable for the following components:   Glucose-Capillary 157 (*)    All other components within normal limits  TROPONIN I (HIGH SENSITIVITY) - Abnormal; Notable for the following components:   Troponin I (High Sensitivity) 22 (*)    All other components within normal limits  TROPONIN I (HIGH SENSITIVITY) - Abnormal; Notable for the following components:   Troponin I (High Sensitivity) 29 (*)    All other components within normal limits  RESP PANEL BY RT-PCR (FLU A&B, COVID) [id number]  URINE CULTURE  LIPASE, BLOOD    EKG EKG Interpretation  Date/Time:  Thursday April 30 2021 14:19:38 EST Ventricular Rate:  84 PR Interval:  134 QRS Duration: 153 QT Interval:  445 QTC Calculation: 527 R Axis:   -19 Text Interpretation: Sinus rhythm Probable left atrial enlargement Right bundle branch block since last tracing no significant change Confirmed by Daleen Bo [phone]) on 04/30/2021 6:57:04 PM   EKG Interpretation  Date/Time:  Thursday April 30 2021 21:37:36 EST Ventricular Rate:  135 PR Interval:  134 QRS Duration: 149 QT Interval:  363 QTC Calculation: 545 R Axis:   -41 Text Interpretation: Atrial fibrillation Multiple ventricular premature complexes RBBB and LAFB ST depression, consider ischemia, diffuse lds Since last tracing of earlier today rate is irregular and faster. Confirmed by Daleen Bo [id number]) on 04/30/2021 9:47:49 PM          Radiology DG Chest 2 View  Result Date: 04/30/2021 CLINICAL DATA:   Nausea and vomiting for several days EXAM: CHEST - 2 VIEW COMPARISON:  None. FINDINGS: Normal mediastinum and cardiac silhouette. Lungs are hyperinflated. Normal pulmonary vasculature. No evidence of effusion, infiltrate, or pneumothorax. No acute bony abnormality. IMPRESSION: Hyperinflated lungs.  No acute findings Electronically Signed   By: Suzy Bouchard M.D.   On: 04/30/2021 16:23   DG Chest Port 1 View  Result Date: 04/30/2021 CLINICAL DATA:  Chest pain.  No sub breath. EXAM: PORTABLE CHEST 1 VIEW COMPARISON:  Radiograph earlier today. FINDINGS: Lower lung volumes from prior exam. Stable heart size and mediastinal contours. Tortuous thoracic aorta with atherosclerosis. Left pleural effusion is increasing left basilar opacity, likely atelectasis. Subsegmental atelectasis in the left mid lung. No pneumothorax. No pulmonary edema. IMPRESSION: 1. Increasing left pleural effusion and left basilar opacity, likely atelectasis. 2. Lower lung volumes from exam earlier this day. Electronically Signed   By: Keith Rake M.D.   On: 04/30/2021 22:08    Procedures .Critical Care Performed by: Daleen Bo, MD Authorized by: Daleen Bo, MD   Critical care provider statement:    Critical care time (minutes):  120   Critical care start time:  04/30/2021 6:52 PM   Critical care end time:  04/30/2021 10:25 PM   Critical care time was exclusive of:  Separately billable procedures and treating other patients   Critical care was time spent personally by me on the following activities:  Blood draw for specimens, development of treatment plan with patient or surrogate, discussions with consultants, evaluation of patient's response to treatment, examination of patient, ordering and performing treatments and interventions, ordering and review of laboratory studies, ordering and review of radiographic studies, pulse oximetry, re-evaluation of patient's condition and review of old charts    Medications  Ordered in ED Medications  cefTRIAXone (ROCEPHIN) 1 g in sodium chloride 0.9 % 100 mL IVPB (1 g Intravenous New Bag/Given 04/30/21 2154)  aspirin 81 MG chewable tablet (  Not Given 04/30/21 2208)  labetalol (NORMODYNE) injection 10 mg (10 mg Intravenous Given 04/30/21 2130)  ondansetron (ZOFRAN) injection 4 mg (4 mg Intravenous Given 04/30/21 2152)  sodium chloride 0.9 % bolus 500 mL (0 mLs Intravenous Stopped 04/30/21 2208)  aspirin tablet 325 mg (325 mg Oral Given 04/30/21 2207)    ED Course/ Medical Decision Making/ A&P Clinical Course as of 04/30/21 2216  Thu Apr 30, 2021  1914 Comprehensive metabolic panel [EW]  5284 Attempt at Orthostatics. She was too weak to stand. Not orthostatic. She states the effort made her naseated.  [EW]  2142 I was called to the patient's room for CODE BLUE.  On my arrival patient was alert, verbalizing, and noted to have rapid heart rate, approximately 150 with multiple PVCs on the cardiac monitor.  Patient was breathing rapidly and communicative.  Nurse reported to me that he was trying to start an IV, when she suddenly turned pale and slumped backwards.  He placed her bed flat, and gave cardiac compressions and called the code.  Compressions were brief.  She did not receive respiratory assistance.  Patient gradually improved, became calmer, slowed breathing, and was communicative.  Patient's daughter left the room on my initial assessment, but came back and I was able to talk to her at this time.  I reviewed the cardiac monitor tracings, there were no episodes of V. tach but there were frequent episodes of PVC with coupling.  Patient's blood pressure after this episode was improved from prior, not hypotensive. [EW]  2154 Cardiac monitor strips from a time.  2132 2131 reviewed.  Tachyarrhythmia noted.  At one point there were multiple PVCs, of ventricular type ectopy, as many as 21 in a row.  This is consistent with the time of decreased responsiveness. [EW]  2211 I  discussed the case with cardiology, Dr. Delfina Redwood.  She will have  the patient seen as a Optometrist, tomorrow.  She states no additional treatment required at this time. [EW]    Clinical Course User Index [EW] Daleen Bo, MD                           Medical Decision Making   Patient Vitals for the past 24 hrs:  BP Temp Temp src Pulse Resp SpO2  04/30/21 2200 (!) 184/80 -- -- 70 20 100 %  04/30/21 2000 (!) 145/65 -- -- 64 19 99 %  04/30/21 1900 (!) 228/79 -- -- (!) 36 16 99 %  04/30/21 1819 (!) 206/89 -- -- 75 15 100 %  04/30/21 1420 -- 98 F (36.7 C) Oral -- -- --  04/30/21 1400 (!) 190/82 -- -- 83 18 100 %    10:12 PM Reevaluation with update and discussion with patient and son at the bedside. After initial assessment and treatment, an updated evaluation reveals she is alert, heart rate normal sinus rhythm rate 72.. Illness risk, further decompensation, discussed. Daleen Bo    Medical Decision Making: Summary of Illness/Injury: She presents for evaluation of an episode of syncope associated with difficulty swallowing, choking and gagging with medicines and food.  She is due to have an outpatient barium swallow done to evaluate this problem.  Critical Interventions-clinical evaluation, laboratory testing, radiography, EKG, cardiac monitor; to evaluate  Chief Complaint  Patient presents with  Syncope    and assess for illness characterized as Acute, Previously Undiagnosed, Uncertain Prognosis, and Complicated   The Differential Diagnoses include metabolic disorders, reflux disease, aspiration pneumonia, cardiac arrhythmia.  I obtained  Additional Historical Information from Joffre daughter , as the patient is a poor historian.  I decided to review pertinent External Data, and in summary seen by gastroenterology on 04/28/2021, evaluated for reflux disease, planned outpatient barium swallow to follow-up on a prior abnormal swallow study indicating  stricture and mucosal abnormality, likely contributing to swallowing disorder.   Clinical Laboratory Tests Ordered, included CBC, Metabolic panel, and troponin I viral panel, urinalysis, urine culture . Review indicates normal except CO2 low, glucose high, BUN high, calcium low, albumin low, total bilirubin high, urinalysis abnormal consistent with infection, hemoglobin low, low level elevation of troponin I, marginally increased on delta checking. Emergent testing abnormality management required for stabilization-abnormal urine required treatment with Rocephin for suspected UTI  Radiologic Tests Ordered, included chest x-ray x2.  I independently Visualized: Radiographic images, which show no infiltrate or edema  Cardiac Monitor Tracing which shows varying rates, with PVCs, nonsustained V. tach, and a period of atrial fibrillation, indicating unstable cardiac rhythm  I obtained medicine Test.  Orthostatic blood pressure and pulse  This patient is Presenting for Evaluation of an episode of syncope, which does require a range of treatment options, and is a complaint that involves a high risk of morbidity and mortality.  Pharmaceutical Risk Management Rocephin and IV fluids Monitoring Required    Treatment Complication Risk Evaluation indicates appropriate disposition is hospitalization for management.  After These Interventions, the Patient was reevaluated and was found with ongoing GI disturbance, difficulty swallowing, weight loss, and cardiac arrhythmia resulting in patient getting weak and receiving a very brief period of chest compressions.  Consultation with cardiology recommend admission for monitoring and they will see as a Optometrist.  Patient with weakness, due to not eating chronically.  Has normal urinalysis with symptoms of urinary frequency, has been started on Rocephin.  No acute metabolic disorders contributing to cardiac arrhythmia.  No history of cardiac disease.    10:16  PM-Consult complete with admitting hospitalist. Patient case explained and discussed. Requirement for he agreed on. Possible Risk of worsening cardiac arrhythmia.  He agrees to admit patient for further evaluation and treatment. Call ended at 10:25 PM  CRITICAL CARE-yes Performed by: Daleen Bo               Final Clinical Impression(s) / ED Diagnoses Final diagnoses:  Ventricular tachycardia  Weight loss  Gastroesophageal reflux disease, unspecified whether esophagitis present  Urinary tract infection without hematuria, site unspecified    Rx / DC Orders ED Discharge Orders     None         Daleen Bo, MD 05/01/21 1323

## 2021-04-30 NOTE — Progress Notes (Signed)
°  Amiodarone Drug - Drug Interaction Consult Note  Recommendations:  Amiodarone is metabolized by the cytochrome P450 system and therefore has the potential to cause many drug interactions. Amiodarone has an average plasma half-life of 50 days (range 20 to 100 days).   There is potential for drug interactions to occur several weeks or months after stopping treatment and the onset of drug interactions may be slow after initiating amiodarone.   []  Statins: Increased risk of myopathy. Simvastatin- restrict dose to 20mg  daily. Other statins: counsel patients to report any muscle pain or weakness immediately.  []  Anticoagulants: Amiodarone can increase anticoagulant effect. Consider warfarin dose reduction. Patients should be monitored closely and the dose of anticoagulant altered accordingly, remembering that amiodarone levels take several weeks to stabilize.  []  Antiepileptics: Amiodarone can increase plasma concentration of phenytoin, the dose should be reduced. Note that small changes in phenytoin dose can result in large changes in levels. Monitor patient and counsel on signs of toxicity.  []  Beta blockers: increased risk of bradycardia, AV block and myocardial depression. Sotalol - avoid concomitant use.  []   Calcium channel blockers (diltiazem and verapamil): increased risk of bradycardia, AV block and myocardial depression.  []   Cyclosporine: Amiodarone increases levels of cyclosporine. Reduced dose of cyclosporine is recommended.  []  Digoxin dose should be halved when amiodarone is started.  [x]  Diuretics: increased risk of cardiotoxicity if hypokalemia occurs.  []  Oral hypoglycemic agents (glyburide, glipizide, glimepiride): increased risk of hypoglycemia. Patient's glucose levels should be monitored closely when initiating amiodarone therapy.   []  Drugs that prolong the QT interval:  Torsades de pointes risk may be increased with concurrent use - avoid if possible.  Monitor QTc, also  keep magnesium/potassium WNL if concurrent therapy can't be avoided.  Antibiotics: e.g. fluoroquinolones, erythromycin.  Antiarrhythmics: e.g. quinidine, procainamide, disopyramide, sotalol.  Antipsychotics: e.g. phenothiazines, haloperidol.   Lithium, tricyclic antidepressants, and methadone. Thank You,   Dolly Rias RPh 04/30/2021, 10:43 PM
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
## 2021-04-30 NOTE — ED Notes (Addendum)
Family at bedside. 

## 2021-04-30 NOTE — ED Notes (Signed)
While RN  starting a new IV. Pt became nauseous then unresponsive. RN called the code and start Chest compressions. After 20-30 sec pt became responsive with pulse. EDP at bed side. Agricultural consultant at bedside. Will continue to monitor.

## 2021-05-01 ENCOUNTER — Encounter (HOSPITAL_COMMUNITY): Payer: Self-pay | Admitting: Internal Medicine

## 2021-05-01 ENCOUNTER — Inpatient Hospital Stay (HOSPITAL_COMMUNITY): Payer: Medicare PPO

## 2021-05-01 DIAGNOSIS — I4721 Torsades de pointes: Secondary | ICD-10-CM

## 2021-05-01 DIAGNOSIS — L8990 Pressure ulcer of unspecified site, unspecified stage: Secondary | ICD-10-CM | POA: Diagnosis present

## 2021-05-01 DIAGNOSIS — R3129 Other microscopic hematuria: Secondary | ICD-10-CM

## 2021-05-01 DIAGNOSIS — R55 Syncope and collapse: Secondary | ICD-10-CM

## 2021-05-01 DIAGNOSIS — R809 Proteinuria, unspecified: Secondary | ICD-10-CM

## 2021-05-01 DIAGNOSIS — R8271 Bacteriuria: Secondary | ICD-10-CM

## 2021-05-01 DIAGNOSIS — R5381 Other malaise: Secondary | ICD-10-CM

## 2021-05-01 DIAGNOSIS — E876 Hypokalemia: Secondary | ICD-10-CM | POA: Diagnosis not present

## 2021-05-01 DIAGNOSIS — Z7189 Other specified counseling: Secondary | ICD-10-CM

## 2021-05-01 DIAGNOSIS — I4891 Unspecified atrial fibrillation: Secondary | ICD-10-CM | POA: Diagnosis not present

## 2021-05-01 DIAGNOSIS — R110 Nausea: Secondary | ICD-10-CM

## 2021-05-01 DIAGNOSIS — R1319 Other dysphagia: Secondary | ICD-10-CM

## 2021-05-01 LAB — COMPREHENSIVE METABOLIC PANEL
ALT: 35 U/L (ref 0–44)
AST: 45 U/L — ABNORMAL HIGH (ref 15–41)
Albumin: 2.8 g/dL — ABNORMAL LOW (ref 3.5–5.0)
Alkaline Phosphatase: 148 U/L — ABNORMAL HIGH (ref 38–126)
Anion gap: 18 — ABNORMAL HIGH (ref 5–15)
BUN: 36 mg/dL — ABNORMAL HIGH (ref 8–23)
CO2: 24 mmol/L (ref 22–32)
Calcium: 8.4 mg/dL — ABNORMAL LOW (ref 8.9–10.3)
Chloride: 97 mmol/L — ABNORMAL LOW (ref 98–111)
Creatinine, Ser: 1.04 mg/dL — ABNORMAL HIGH (ref 0.44–1.00)
GFR, Estimated: 53 mL/min — ABNORMAL LOW (ref >60)
Glucose, Bld: 166 mg/dL — ABNORMAL HIGH (ref 70–99)
Potassium: 2.4 mmol/L — CL (ref 3.5–5.1)
Sodium: 139 mmol/L (ref 135–145)
Total Bilirubin: 1.2 mg/dL (ref 0.3–1.2)
Total Protein: 6 g/dL — ABNORMAL LOW (ref 6.5–8.1)

## 2021-05-01 LAB — ECHOCARDIOGRAM COMPLETE
AV Mean grad: 3 mmHg
AV Peak grad: 6.2 mmHg
Ao pk vel: 1.24 m/s
Area-P 1/2: 4.21 cm2
Height: 61 in
S' Lateral: 2.7 cm
Weight: 2246.01 oz

## 2021-05-01 LAB — RENAL FUNCTION PANEL
Albumin: 3 g/dL — ABNORMAL LOW (ref 3.5–5.0)
Anion gap: 13 (ref 5–15)
BUN: 35 mg/dL — ABNORMAL HIGH (ref 8–23)
CO2: 18 mmol/L — ABNORMAL LOW (ref 22–32)
Calcium: 8.4 mg/dL — ABNORMAL LOW (ref 8.9–10.3)
Chloride: 106 mmol/L (ref 98–111)
Creatinine, Ser: 1.01 mg/dL — ABNORMAL HIGH (ref 0.44–1.00)
GFR, Estimated: 55 mL/min — ABNORMAL LOW (ref >60)
Glucose, Bld: 172 mg/dL — ABNORMAL HIGH (ref 70–99)
Phosphorus: 2.9 mg/dL (ref 2.5–4.6)
Potassium: 5.6 mmol/L — ABNORMAL HIGH (ref 3.5–5.1)
Sodium: 137 mmol/L (ref 135–145)

## 2021-05-01 LAB — CBC
HCT: 25.3 % — ABNORMAL LOW (ref 36.0–46.0)
HCT: 31.6 % — ABNORMAL LOW (ref 36.0–46.0)
Hemoglobin: 8.3 g/dL — ABNORMAL LOW (ref 12.0–15.0)
Hemoglobin: 10 g/dL — ABNORMAL LOW (ref 12.0–15.0)
MCH: 32.4 pg (ref 26.0–34.0)
MCH: 32.7 pg (ref 26.0–34.0)
MCHC: 32.8 g/dL (ref 30.0–36.0)
MCHC: 31.6 g/dL (ref 30.0–36.0)
MCV: 98.8 fL (ref 80.0–100.0)
MCV: 103.3 fL — ABNORMAL HIGH (ref 80.0–100.0)
Platelets: 464 10*3/uL — ABNORMAL HIGH (ref 150–400)
Platelets: 456 10*3/uL — ABNORMAL HIGH (ref 150–400)
RBC: 2.56 MIL/uL — ABNORMAL LOW (ref 3.87–5.11)
RBC: 3.06 MIL/uL — ABNORMAL LOW (ref 3.87–5.11)
RDW: 14.2 % (ref 11.5–15.5)
RDW: 14.5 % (ref 11.5–15.5)
WBC: 9.4 10*3/uL (ref 4.0–10.5)
WBC: 9.6 10*3/uL (ref 4.0–10.5)
nRBC: 0 % (ref 0.0–0.2)
nRBC: 0.2 % (ref 0.0–0.2)

## 2021-05-01 LAB — MRSA NEXT GEN BY PCR, NASAL: MRSA by PCR Next Gen: NOT DETECTED

## 2021-05-01 LAB — MAGNESIUM
Magnesium: 2 mg/dL (ref 1.7–2.4)
Magnesium: 2.1 mg/dL (ref 1.7–2.4)

## 2021-05-01 LAB — CBG MONITORING, ED: Glucose-Capillary: 149 mg/dL — ABNORMAL HIGH (ref 70–99)

## 2021-05-01 LAB — POTASSIUM: Potassium: 4.3 mmol/L (ref 3.5–5.1)

## 2021-05-01 MED ORDER — STERILE WATER FOR INJECTION IV SOLN
INTRAVENOUS | Status: DC
  Filled 2021-05-01: qty 150

## 2021-05-01 MED ORDER — HYDROCODONE-ACETAMINOPHEN 5-325 MG PO TABS
1.0000 | ORAL_TABLET | Freq: Once | ORAL | Status: AC
  Administered 2021-05-01: 1 via ORAL
  Filled 2021-05-01: qty 1

## 2021-05-01 MED ORDER — HYDRALAZINE HCL 20 MG/ML IJ SOLN
10.0000 mg | Freq: Four times a day (QID) | INTRAMUSCULAR | Status: DC
  Administered 2021-05-01 (×3): 10 mg via INTRAVENOUS
  Filled 2021-05-01 (×4): qty 1

## 2021-05-01 MED ORDER — PROCHLORPERAZINE EDISYLATE 10 MG/2ML IJ SOLN
5.0000 mg | Freq: Once | INTRAMUSCULAR | Status: AC
Start: 2021-05-01 — End: 2021-05-01
  Administered 2021-05-01: 5 mg via INTRAVENOUS
  Filled 2021-05-01: qty 2

## 2021-05-01 MED ORDER — CHLORHEXIDINE GLUCONATE CLOTH 2 % EX PADS
6.0000 | MEDICATED_PAD | Freq: Every day | CUTANEOUS | Status: DC
  Administered 2021-05-01 – 2021-05-03 (×2): 6 via TOPICAL

## 2021-05-01 MED ORDER — POTASSIUM CHLORIDE 20 MEQ PO PACK
40.0000 meq | PACK | ORAL | Status: AC
  Administered 2021-05-01 (×2): 40 meq via ORAL
  Filled 2021-05-01 (×2): qty 2

## 2021-05-01 MED ORDER — IRBESARTAN 150 MG PO TABS
150.0000 mg | ORAL_TABLET | Freq: Every day | ORAL | Status: DC
  Administered 2021-05-01 – 2021-05-04 (×4): 150 mg via ORAL
  Filled 2021-05-01 (×4): qty 1

## 2021-05-01 MED ORDER — ONDANSETRON HCL 4 MG/2ML IJ SOLN
4.0000 mg | Freq: Once | INTRAMUSCULAR | Status: AC
  Administered 2021-05-01: 4 mg via INTRAVENOUS
  Filled 2021-05-01: qty 2

## 2021-05-01 MED ORDER — IRBESARTAN 150 MG PO TABS: 150.0000 mg | ORAL_TABLET | Freq: Every day | ORAL | Status: DC

## 2021-05-01 MED ORDER — LABETALOL HCL 5 MG/ML IV SOLN: 5.0000 mg | INTRAVENOUS | Status: DC | PRN

## 2021-05-01 MED ORDER — POTASSIUM CHLORIDE 10 MEQ/100ML IV SOLN
10.0000 meq | INTRAVENOUS | Status: AC
  Administered 2021-05-01 (×6): 10 meq via INTRAVENOUS
  Filled 2021-05-01 (×5): qty 100

## 2021-05-01 MED ORDER — ORAL CARE MOUTH RINSE
15.0000 mL | Freq: Two times a day (BID) | OROMUCOSAL | Status: DC
  Administered 2021-05-01 – 2021-05-13 (×22): 15 mL via OROMUCOSAL

## 2021-05-01 MED ORDER — PANTOPRAZOLE SODIUM 40 MG IV SOLR
40.0000 mg | Freq: Two times a day (BID) | INTRAVENOUS | Status: DC
  Administered 2021-05-01 – 2021-05-04 (×7): 40 mg via INTRAVENOUS
  Filled 2021-05-01 (×7): qty 40

## 2021-05-01 MED ORDER — LORAZEPAM 2 MG/ML IJ SOLN
0.2500 mg | Freq: Three times a day (TID) | INTRAMUSCULAR | Status: DC | PRN
  Administered 2021-05-01 – 2021-05-10 (×5): 0.25 mg via INTRAVENOUS
  Filled 2021-05-01 (×5): qty 1

## 2021-05-01 NOTE — Progress Notes (Signed)
OT Cancellation Note  Patient Details Name: [name] MRN: [id number] DOB: [birth year]   Cancelled Treatment:    Reason Eval/Treat Not Completed: Patient not medically ready  Lenward Chancellor 05/01/2021, 7:40 AM

## 2021-05-01 NOTE — ED Notes (Signed)
Pt resting, alert and oriented.

## 2021-05-01 NOTE — Progress Notes (Signed)
PT Cancellation Note  Patient Details Name: [name] MRN: [id number] DOB: 1935/02/11   Cancelled Treatment:    Reason Eval/Treat Not Completed: Patient not medically ready   Claretha Cooper 05/01/2021, 7:39 AM [place] Pager [phone] Office [phone]

## 2021-05-01 NOTE — ED Notes (Addendum)
Handoff was given to [place]. RN verbalized she was ready to receive pt.

## 2021-05-01 NOTE — ED Notes (Signed)
Re-called (2nd time) Cardiology @[phone] for call back to [facility] MD
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
## 2021-05-01 NOTE — Progress Notes (Signed)
PROGRESS NOTE  [name] QAS:[id number] DOB: 01-Sep-1934   PCP: Aretta Nip, MD  Patient is from: Home.  Lives with family.  DOA: 04/30/2021 LOS: 1  Chief complaints:  Chief Complaint  Patient presents with   Syncope     Brief Narrative / Interim history: 87 year old F with PMH of hiatal hernia status postrepair, dysphagia, GERD, CHF, PE not on AC, chronic hypoxic RF on 2 L at bedtime, cognitive impairment and venous insufficiency brought to ED by EMS after an episode of 3-second LOC after coughing spell followed by generalized weakness.   In ED, SBP in the range of 140s to 220.  Hgb 9.8 (baseline).  No leukocytosis.  Bicarb 21.  AG 17.  Total bili 2.8 (baseline).  Trop 22>> 29.  Initial EKG with sinus rhythm and QTC to 530s but wide QRS.  UA with large Hgb,> 50 RBC,> 300 protein and many bacteria.  CXR with hyperinflated lungs and increased left pleural effusion with adjacent atelectasis.  Urine culture obtained.  Received aspirin, CTX, Zofran, labetalol and 500 cc bolus.    While in ED, CODE BLUE called when patient became pale and slumped over but no LOC.  She had brief CPR.  No VT, VF or asystole on monitor.  EKG A. fib with RVR to 130s and prolonged QT to 540s but wide QRS.  She later developed A. fib with RVR and had 20 beats of nonsustained VT. Cardiology consulted.  She was started on amiodarone and admitted.   Subjective: Seen and examined earlier this morning.  Patient's daughter at bedside.  She continues to endorse some epigastric distress that feels like hiccups.  She is nauseous and spitting up.  Some chest pain from CPR and coughing.  No shortness of breath at rest.  She denies UTI symptoms.  She says she does not want CPR, DCCV or intubation but likes to have ACLS meds and NIPPV if needed.  Objective: Vitals:   05/01/21 0530 05/01/21 0645 05/01/21 0919 05/01/21 1000  BP: (!) 191/62 (!) 200/70 (!) 183/96 (!) 173/65  Pulse: 63 60  73  Resp: (!) 23 (!) 23  20   Temp:  98.2 F (36.8 C)    TempSrc:      SpO2: (!) 88% 94%  98%  Weight:      Height:        Examination:  GENERAL: Frail looking elderly female.  Uncomfortable due to nausea and reflux  HEENT: MMM.  Vision and hearing grossly intact.  NECK: Supple.  No apparent JVD.  RESP: Percent on 2 L no IWOB.  Fair aeration bilaterally. CVS:  RRR. Heart sounds normal.  ABD/GI/GU: BS+. Abd soft, NTND.  MSK/EXT:  Moves extremities. No apparent deformity.  Trace BLE edema. SKIN: no apparent skin lesion or wound NEURO: Awake, alert and oriented appropriately.  No apparent focal neuro deficit. PSYCH: Calm. Normal affect.   Procedures:  None  Microbiology summarized: [id number] and influenza PCR nonreactive. Urine culture pending  Assessment & Plan: Possible syncope/brief LOC-initial episode at home looks like vasovagal after cough fit Paroxysmal A. fib with RVR-new onset. NSVT-about 20 beats of NSVT while in ED Prolonged QT/torsade de pointes-QTC ranges from 530s to 550s but exaggerated by wide QRS with RBBB -Cardiology managing -On amiodarone drip -Continue telemetry monitoring -Cardiology does not recommend anticoagulation given brief A. fib. -Follow echocardiogram and TSH -Optimize Mg and potassium -Minimize or avoid QT prolonging drugs  Dysphagia/GERD/nausea/history of hiatal hernia status postrepair:  Concern about possible  stricture. Followed by [name] outpatient with plan for barium swallow -Follow SLP eval -Change PPI to IV -Low-dose Ativan as needed for nausea  Bacteriuria/hematuria/possible proteinuria-patient denies UTI symptoms.  No fever.  Mild leukocytosis likely demargination. -Doubt need for further antibiotics   Chronic diastolic CHF: TTE in 08/6385 with LVEF of 60 to 65%, G1 DD and normal RVSP.  Not on diuretics at home.  Does not look fluid overloaded.  Very dark urine with ketonuria likely from dehydration -Follow repeat echocardiogram -Continue gentle IV  fluid -Monitor fluid and respiratory status  Uncontrolled hypertension: SBP remains elevated in 170s to 200s -Increase Avapro to 150 mg daily -Continue IV hydralazine 10 mg every 6 hours per cardiology  Chronic COPD/chronic hypoxic RF: Wears 2 L at night.  Stable. -Continue breathing treatments and supplemental oxygen  Normocytic anemia: Drop in Hgb likely dilutional.  Patient and daughter denies melena or hematochezia. Recent Labs    04/17/21 1813 04/30/21 1507 05/01/21 0520  HGB 10.0* 9.8* 8.3*  -Continue monitoring -Check anemia panel in the morning  Renal insufficiency/azotemia: Creatinine slightly higher than baseline. Recent Labs    07/08/20 1116 04/17/21 1813 04/30/21 1507 05/01/21 0520  BUN 13 17 35* 36*  CREATININE 0.81 0.83 0.94 1.04*  -Recheck  Elevated anion gap: Due to azotemia? -Continue monitoring  Elevated troponin: Likely demand ischemia from #1.  No acute ischemic finding on EKG. -Follow TTE -Continue monitoring  Severe hypokalemia: K2.4.  Mg 2.0.  Patient is on HCTZ at home -Discontinue HCTZ -Agree with IV KCl 10x6 -Added p.o. KCl 40x2 -Recheck in the afternoon   GERD - Continue home PPI  Generalized weakness/physical deconditioning -PT/OT eval  Goal of care counseling: Discussed CODE STATUS with patient and patient's daughter at bedside.  Patient is not interested in CPR, DCCV or intubation but okay with ACLS meds and NIPPV. -Updated CODE STATUS in the computer.  Body mass index is 26.52 kg/m.         DVT prophylaxis:  enoxaparin (LOVENOX) injection 40 mg Start: 05/01/21 1000  Code Status: Partial-no CPR, DCCV or intubation but ACLS meds and NIPPV Family Communication: Updated patient's daughter at bedside. Level of care: Stepdown Status is: Inpatient  Remains inpatient appropriate because: Due to syncope/arrhythmia requiring IV amiodarone and further evaluation   Final disposition: TBD    Consultants:  Cardiology   Sch  Meds:  Scheduled Meds:  aspirin EC  81 mg Oral Daily   enoxaparin (LOVENOX) injection  40 mg Subcutaneous Q24H   fluticasone furoate-vilanterol  1 puff Inhalation Daily   hydrALAZINE  10 mg Intravenous Q6H   irbesartan  150 mg Oral Daily   pantoprazole (PROTONIX) IV  40 mg Intravenous Q12H   potassium chloride  40 mEq Oral Q4H   sodium chloride flush  3 mL Intravenous Q12H   Continuous Infusions:  amiodarone 30 mg/hr (05/01/21 1232)   potassium chloride 10 mEq (05/01/21 1226)   PRN Meds:.acetaminophen **OR** acetaminophen, albuterol, labetalol, LORazepam, polyethylene glycol  Antimicrobials: Anti-infectives (From admission, onward)    Start     Dose/Rate Route Frequency Ordered Stop   05/01/21 2200  cefTRIAXone (ROCEPHIN) 1 g in sodium chloride 0.9 % 100 mL IVPB  Status:  Discontinued        1 g 200 mL/hr over 30 Minutes Intravenous Every 24 hours 04/30/21 2247 05/01/21 1231   04/30/21 2130  cefTRIAXone (ROCEPHIN) 1 g in sodium chloride 0.9 % 100 mL IVPB        1 g 200  mL/hr over 30 Minutes Intravenous  Once 04/30/21 2119 04/30/21 2308        I have personally reviewed the following labs and images: CBC: Recent Labs  Lab 04/30/21 1507 05/01/21 0520  WBC 6.5 9.4  NEUTROABS 5.5  --   HGB 9.8* 8.3*  HCT 29.5* 25.3*  MCV 98.0 98.8  PLT 443* 464*   BMP &GFR Recent Labs  Lab 04/30/21 1507 05/01/21 0520  NA 137 139  K 4.2 2.4*  CL 99 97*  CO2 21* 24  GLUCOSE 153* 166*  BUN 35* 36*  CREATININE 0.94 1.04*  CALCIUM 8.7* 8.4*  MG  --  2.0   Estimated Creatinine Clearance: 34.5 mL/min (A) (by C-G formula based on SCr of 1.04 mg/dL (H)). Liver & Pancreas: Recent Labs  Lab 04/30/21 1507 05/01/21 0520  AST 30 45*  ALT 15 35  ALKPHOS 202* 148*  BILITOT 2.8* 1.2  PROT 7.3 6.0*  ALBUMIN 3.2* 2.8*   Recent Labs  Lab 04/30/21 1507  LIPASE 28   No results for input(s): AMMONIA in the last 168 hours. Diabetic: No results for input(s): HGBA1C in the last 72  hours. Recent Labs  Lab 04/30/21 2136 05/01/21 0518  GLUCAP 157* 149*   Cardiac Enzymes: No results for input(s): CKTOTAL, CKMB, CKMBINDEX, TROPONINI in the last 168 hours. Recent Labs    07/08/20 1116 02/03/21 1510  PROBNP 350.0* 647   Coagulation Profile: No results for input(s): INR, PROTIME in the last 168 hours. Thyroid Function Tests: No results for input(s): TSH, T4TOTAL, FREET4, T3FREE, THYROIDAB in the last 72 hours. Lipid Profile: No results for input(s): CHOL, HDL, LDLCALC, TRIG, CHOLHDL, LDLDIRECT in the last 72 hours. Anemia Panel: No results for input(s): VITAMINB12, FOLATE, FERRITIN, TIBC, IRON, RETICCTPCT in the last 72 hours. Urine analysis:    Component Value Date/Time   COLORURINE YELLOW 04/30/2021 1520   APPEARANCEUR HAZY (A) 04/30/2021 1520   LABSPEC 1.015 04/30/2021 1520   PHURINE 5.0 04/30/2021 1520   GLUCOSEU NEGATIVE 04/30/2021 1520   HGBUR LARGE (A) 04/30/2021 1520   BILIRUBINUR NEGATIVE 04/30/2021 1520   KETONESUR 20 (A) 04/30/2021 1520   PROTEINUR >=300 (A) 04/30/2021 1520   UROBILINOGEN 0.2 06/28/2011 1358   NITRITE NEGATIVE 04/30/2021 1520   LEUKOCYTESUR NEGATIVE 04/30/2021 1520   Sepsis Labs: Invalid input(s): PROCALCITONIN, [place]  Microbiology: Recent Results (from the past 240 hour(s))  Resp Panel by RT-PCR (Flu A&B, Covid) Nasopharyngeal Swab     Status: None   Collection Time: 04/30/21  3:40 PM   Specimen: Nasopharyngeal Swab; Nasopharyngeal(NP) swabs in vial transport medium  Result Value Ref Range Status   SARS Coronavirus 2 by RT PCR NEGATIVE NEGATIVE Final    Comment: (NOTE) SARS-CoV-2 target nucleic acids are NOT DETECTED.  The SARS-CoV-2 RNA is generally detectable in upper respiratory specimens during the acute phase of infection. The lowest concentration of SARS-CoV-2 viral copies this assay can detect is 138 copies/mL. A negative result does not preclude SARS-Cov-2 infection and should not be used as the sole  basis for treatment or other patient management decisions. A negative result may occur with  improper specimen collection/handling, submission of specimen other than nasopharyngeal swab, presence of viral mutation(s) within the areas targeted by this assay, and inadequate number of viral copies(<138 copies/mL). A negative result must be combined with clinical observations, patient history, and epidemiological information. The expected result is Negative.  Fact Sheet for Patients:  EntrepreneurPulse.com.au  Fact Sheet for Healthcare Providers:  IncredibleEmployment.be  This  test is no t yet approved or cleared by the [place] and  has been authorized for detection and/or diagnosis of SARS-CoV-2 by FDA under an Emergency Use Authorization (EUA). This EUA will remain  in effect (meaning this test can be used) for the duration of the COVID-19 declaration under Section 564(b)(1) of the Act, 21 U.S.C.section 360bbb-3(b)(1), unless the authorization is terminated  or revoked sooner.       Influenza A by PCR NEGATIVE NEGATIVE Final   Influenza B by PCR NEGATIVE NEGATIVE Final    Comment: (NOTE) The Xpert Xpress SARS-CoV-2/FLU/RSV plus assay is intended as an aid in the diagnosis of influenza from Nasopharyngeal swab specimens and should not be used as a sole basis for treatment. Nasal washings and aspirates are unacceptable for Xpert Xpress SARS-CoV-2/FLU/RSV testing.  Fact Sheet for Patients: EntrepreneurPulse.com.au  Fact Sheet for Healthcare Providers: IncredibleEmployment.be  This test is not yet approved or cleared by the [place] FDA and has been authorized for detection and/or diagnosis of SARS-CoV-2 by FDA under an Emergency Use Authorization (EUA). This EUA will remain in effect (meaning this test can be used) for the duration of the COVID-19 declaration under Section 564(b)(1) of the Act,  21 U.S.C. section 360bbb-3(b)(1), unless the authorization is terminated or revoked.  Performed at [facility], [place] [address], [place], [place] [address]     Radiology Studies: DG Chest 2 View  Result Date: 04/30/2021 CLINICAL DATA:  Nausea and vomiting for several days EXAM: CHEST - 2 VIEW COMPARISON:  None. FINDINGS: Normal mediastinum and cardiac silhouette. Lungs are hyperinflated. Normal pulmonary vasculature. No evidence of effusion, infiltrate, or pneumothorax. No acute bony abnormality. IMPRESSION: Hyperinflated lungs.  No acute findings Electronically Signed   By: Suzy Bouchard M.D.   On: 04/30/2021 16:23   DG Chest Port 1 View  Result Date: 04/30/2021 CLINICAL DATA:  Chest pain.  No sub breath. EXAM: PORTABLE CHEST 1 VIEW COMPARISON:  Radiograph earlier today. FINDINGS: Lower lung volumes from prior exam. Stable heart size and mediastinal contours. Tortuous thoracic aorta with atherosclerosis. Left pleural effusion is increasing left basilar opacity, likely atelectasis. Subsegmental atelectasis in the left mid lung. No pneumothorax. No pulmonary edema. IMPRESSION: 1. Increasing left pleural effusion and left basilar opacity, likely atelectasis. 2. Lower lung volumes from exam earlier this day. Electronically Signed   By: Keith Rake M.D.   On: 04/30/2021 22:08      Kaitlynn Tramontana T. Mylo  If 7PM-7AM, please contact night-coverage [email or web address] 05/01/2021, 12:36 PM

## 2021-05-01 NOTE — ED Notes (Signed)
Pt complaining of vomiting

## 2021-05-02 DIAGNOSIS — R55 Syncope and collapse: Secondary | ICD-10-CM | POA: Diagnosis not present

## 2021-05-02 LAB — BASIC METABOLIC PANEL
Anion gap: 10 (ref 5–15)
BUN: 40 mg/dL — ABNORMAL HIGH (ref 8–23)
CO2: 26 mmol/L (ref 22–32)
Calcium: 8.3 mg/dL — ABNORMAL LOW (ref 8.9–10.3)
Chloride: 102 mmol/L (ref 98–111)
Creatinine, Ser: 1.34 mg/dL — ABNORMAL HIGH (ref 0.44–1.00)
GFR, Estimated: 39 mL/min — ABNORMAL LOW (ref >60)
Glucose, Bld: 152 mg/dL — ABNORMAL HIGH (ref 70–99)
Potassium: 4 mmol/L (ref 3.5–5.1)
Sodium: 138 mmol/L (ref 135–145)

## 2021-05-02 LAB — URINE CULTURE: Culture: >=100000 — AB

## 2021-05-02 LAB — GLUCOSE, CAPILLARY: Glucose-Capillary: 150 mg/dL — ABNORMAL HIGH (ref 70–99)

## 2021-05-02 MED ORDER — LACTATED RINGERS IV SOLN: INTRAVENOUS | Status: AC

## 2021-05-02 MED ORDER — SENNOSIDES-DOCUSATE SODIUM 8.6-50 MG PO TABS
2.0000 | ORAL_TABLET | Freq: Two times a day (BID) | ORAL | Status: DC
  Administered 2021-05-02 – 2021-05-12 (×15): 2 via ORAL
  Filled 2021-05-02 (×20): qty 2

## 2021-05-02 MED ORDER — HYDRALAZINE HCL 20 MG/ML IJ SOLN
10.0000 mg | Freq: Four times a day (QID) | INTRAMUSCULAR | Status: DC | PRN
  Administered 2021-05-03 – 2021-05-16 (×3): 10 mg via INTRAVENOUS
  Filled 2021-05-02 (×3): qty 1

## 2021-05-02 MED ORDER — AMIODARONE HCL 200 MG PO TABS
200.0000 mg | ORAL_TABLET | Freq: Every day | ORAL | Status: DC
  Administered 2021-05-12 – 2021-05-13 (×2): 200 mg via ORAL
  Filled 2021-05-02 (×3): qty 1

## 2021-05-02 MED ORDER — FLUTICASONE FUROATE-VILANTEROL 200-25 MCG/ACT IN AEPB
1.0000 | INHALATION_SPRAY | Freq: Every day | RESPIRATORY_TRACT | Status: DC
  Administered 2021-05-02 – 2021-05-16 (×14): 1 via RESPIRATORY_TRACT
  Filled 2021-05-02 (×2): qty 28

## 2021-05-02 MED ORDER — AMIODARONE HCL 200 MG PO TABS
400.0000 mg | ORAL_TABLET | Freq: Two times a day (BID) | ORAL | Status: DC
  Administered 2021-05-02 – 2021-05-10 (×18): 400 mg via ORAL
  Filled 2021-05-02 (×18): qty 2

## 2021-05-02 MED ORDER — HYDROMORPHONE HCL 1 MG/ML IJ SOLN
0.5000 mg | INTRAMUSCULAR | Status: DC | PRN
  Administered 2021-05-02 – 2021-05-06 (×4): 0.5 mg via INTRAVENOUS
  Filled 2021-05-02 (×2): qty 1
  Filled 2021-05-02 (×2): qty 0.5

## 2021-05-02 MED ORDER — OXYCODONE HCL 5 MG PO TABS
5.0000 mg | ORAL_TABLET | Freq: Four times a day (QID) | ORAL | Status: DC | PRN
  Administered 2021-05-02 – 2021-05-10 (×13): 5 mg via ORAL
  Filled 2021-05-02 (×15): qty 1

## 2021-05-02 MED ORDER — SODIUM CHLORIDE 0.9 % IV SOLN
1.0000 g | INTRAVENOUS | Status: DC
  Administered 2021-05-02 – 2021-05-03 (×2): 1 g via INTRAVENOUS
  Filled 2021-05-02 (×2): qty 10

## 2021-05-02 MED ORDER — ENOXAPARIN SODIUM 30 MG/0.3ML IJ SOSY
30.0000 mg | PREFILLED_SYRINGE | INTRAMUSCULAR | Status: DC
  Administered 2021-05-03 – 2021-05-04 (×2): 30 mg via SUBCUTANEOUS
  Filled 2021-05-02 (×2): qty 0.3

## 2021-05-02 NOTE — Progress Notes (Signed)
Progress Note  Patient Name: [name] Date of Encounter: 05/02/2021  Primary Cardiologist: Jenne Campus, MD   Subjective   Ectopy has resolved.  Patient feels persistent sternal pain.  Cough has improved.  Able to swallow.  Inpatient Medications    Scheduled Meds:  aspirin EC  81 mg Oral Daily   Chlorhexidine Gluconate Cloth  6 each Topical Daily   enoxaparin (LOVENOX) injection  40 mg Subcutaneous Q24H   fluticasone furoate-vilanterol  1 puff Inhalation Daily   hydrALAZINE  10 mg Intravenous Q6H   irbesartan  150 mg Oral Daily   mouth rinse  15 mL Mouth Rinse BID   pantoprazole (PROTONIX) IV  40 mg Intravenous Q12H   senna-docusate  2 tablet Oral BID   sodium chloride flush  3 mL Intravenous Q12H   Continuous Infusions:  amiodarone 30 mg/hr (05/02/21 0400)   PRN Meds: acetaminophen **OR** acetaminophen, albuterol, HYDROmorphone (DILAUDID) injection, labetalol, LORazepam, oxyCODONE, polyethylene glycol   Vital Signs    Vitals:   05/02/21 0359 05/02/21 0400 05/02/21 0500 05/02/21 0600  BP:  (!) 148/54 (!) 119/48 (!) 157/55  Pulse:  74 74 72  Resp:  (!) 22 14 17   Temp: 98 F (36.7 C)     TempSrc: Axillary     SpO2:  97% 99% 97%  Weight:      Height:        Intake/Output Summary (Last 24 hours) at 05/02/2021 0802 Last data filed at 05/02/2021 0754 Gross per 24 hour  Intake 1869.12 ml  Output 500 ml  Net 1369.12 ml   Filed Weights   04/30/21 2245 05/01/21 1500  Weight: 63.7 kg 63.8 kg    Telemetry    SR - Personally Reviewed  ECG    Pending  Physical Exam   Gen: mild distress, elderly female   Neck: No JVD,  Cardiac: No Rubs or Gallops, no Murmur, regular rhythm +2 radial pulses Respiratory: Clear to auscultation bilaterally, normal effort, normal  respiratory rate GI: Soft, nontender, non-distended  MS: No  edema;  moves all extremities Integument: Skin feels warm, Right AC infiltration of IV Neuro:  At time of evaluation, alert and  oriented to person/place/time/situation  Psych: Normal affect, patient feels in pain   Labs    Chemistry Recent Labs  Lab 04/30/21 1507 05/01/21 0520 05/01/21 1532 05/01/21 2001 05/02/21 0246  NA 137 139 137  --  138  K 4.2 2.4* 5.6* 4.3 4.0  CL 99 97* 106  --  102  CO2 21* 24 18*  --  26  GLUCOSE 153* 166* 172*  --  152*  BUN 35* 36* 35*  --  40*  CREATININE 0.94 1.04* 1.01*  --  1.34*  CALCIUM 8.7* 8.4* 8.4*  --  8.3*  PROT 7.3 6.0*  --   --   --   ALBUMIN 3.2* 2.8* 3.0*  --   --   AST 30 45*  --   --   --   ALT 15 35  --   --   --   ALKPHOS 202* 148*  --   --   --   BILITOT 2.8* 1.2  --   --   --   GFRNONAA 60* 53* 55*  --  39*  ANIONGAP 17* 18* 13  --  10     Hematology Recent Labs  Lab 04/30/21 1507 05/01/21 0520 05/01/21 1532  WBC 6.5 9.4 9.6  RBC 3.01* 2.56* 3.06*  HGB 9.8* 8.3*  10.0*  HCT 29.5* 25.3* 31.6*  MCV 98.0 98.8 103.3*  MCH 32.6 32.4 32.7  MCHC 33.2 32.8 31.6  RDW 14.5 14.2 14.5  PLT 443* 464* 456*    Cardiac EnzymesNo results for input(s): TROPONINI in the last 168 hours. No results for input(s): TROPIPOC in the last 168 hours.   BNPNo results for input(s): BNP, PROBNP in the last 168 hours.   DDimer No results for input(s): DDIMER in the last 168 hours.   Radiology    DG Chest 2 View  Result Date: 04/30/2021 CLINICAL DATA:  Nausea and vomiting for several days EXAM: CHEST - 2 VIEW COMPARISON:  None. FINDINGS: Normal mediastinum and cardiac silhouette. Lungs are hyperinflated. Normal pulmonary vasculature. No evidence of effusion, infiltrate, or pneumothorax. No acute bony abnormality. IMPRESSION: Hyperinflated lungs.  No acute findings Electronically Signed   By: Suzy Bouchard M.D.   On: 04/30/2021 16:23   DG Chest Port 1 View  Result Date: 04/30/2021 CLINICAL DATA:  Chest pain.  No sub breath. EXAM: PORTABLE CHEST 1 VIEW COMPARISON:  Radiograph earlier today. FINDINGS: Lower lung volumes from prior exam. Stable heart size and  mediastinal contours. Tortuous thoracic aorta with atherosclerosis. Left pleural effusion is increasing left basilar opacity, likely atelectasis. Subsegmental atelectasis in the left mid lung. No pneumothorax. No pulmonary edema. IMPRESSION: 1. Increasing left pleural effusion and left basilar opacity, likely atelectasis. 2. Lower lung volumes from exam earlier this day. Electronically Signed   By: Keith Rake M.D.   On: 04/30/2021 22:08   ECHOCARDIOGRAM COMPLETE  Result Date: 05/01/2021    ECHOCARDIOGRAM REPORT   Patient Name:   [name] Date of Exam: 05/01/2021 Medical Rec #:  [id number]      Height:       61.0 in Accession #:    [id number]     Weight:       140.4 lb Date of Birth:  30-Apr-1934      BSA:          1.625 m Patient Age:    54 years       BP:           200/70 mmHg Patient Gender: F              HR:           70 bpm. Exam Location:  Inpatient Procedure: 2D Echo, Cardiac Doppler and Color Doppler Indications:    R55 Syncope  History:        Patient has prior history of Echocardiogram examinations, most                 recent 10/22/2020. Arrythmias:Atrial Fibrillation and Tachycardia,                 Signs/Symptoms:Dyspnea and Edema; Risk Factors:Hypertension.  Sonographer:    Glo Herring Referring Phys: [id number] Luke  1. Left ventricular ejection fraction, by estimation, is 60 to 65%. The left ventricle has normal function. The left ventricle has no regional wall motion abnormalities. Left ventricular diastolic parameters are consistent with Grade I diastolic dysfunction (impaired relaxation).  2. Right ventricular systolic function is normal. The right ventricular size is normal.  3. The mitral valve is grossly normal. Mild mitral valve regurgitation.  4. The aortic valve is tricuspid. There is mild calcification of the aortic valve. There is mild thickening of the aortic valve. Aortic valve regurgitation is not visualized. Aortic valve sclerosis/calcification is  present, without any evidence of  aortic stenosis.  5. Aortic dilatation noted. There is mild dilatation of the ascending aorta, measuring 37 mm.  6. The inferior vena cava is normal in size with greater than 50% respiratory variability, suggesting right atrial pressure of 3 mmHg. Comparison(s): Compared to prior TTE in 10/2020, there is no significant change. FINDINGS  Left Ventricle: Left ventricular ejection fraction, by estimation, is 60 to 65%. The left ventricle has normal function. The left ventricle has no regional wall motion abnormalities. The left ventricular internal cavity size was normal in size. There is  no left ventricular hypertrophy. Left ventricular diastolic parameters are consistent with Grade I diastolic dysfunction (impaired relaxation). Right Ventricle: The right ventricular size is normal. No increase in right ventricular wall thickness. Right ventricular systolic function is normal. Left Atrium: Left atrial size was normal in size. Right Atrium: Right atrial size was normal in size. Pericardium: There is no evidence of pericardial effusion. Mitral Valve: The mitral valve is grossly normal. Mild mitral valve regurgitation. Tricuspid Valve: The tricuspid valve is normal in structure. Tricuspid valve regurgitation is trivial. Aortic Valve: The aortic valve is tricuspid. There is mild calcification of the aortic valve. There is mild thickening of the aortic valve. Aortic valve regurgitation is not visualized. Aortic valve sclerosis/calcification is present, without any evidence of aortic stenosis. Aortic valve mean gradient measures 3.0 mmHg. Aortic valve peak gradient measures 6.2 mmHg. Pulmonic Valve: The pulmonic valve was normal in structure. Pulmonic valve regurgitation is not visualized. Aorta: Aortic dilatation noted. There is mild dilatation of the ascending aorta, measuring 37 mm. Venous: The inferior vena cava is normal in size with greater than 50% respiratory variability, suggesting  right atrial pressure of 3 mmHg. IAS/Shunts: The atrial septum is grossly normal.  LEFT VENTRICLE PLAX 2D LVIDd:         4.80 cm Diastology LVIDs:         2.70 cm LV e' medial:    7.40 cm/s LV PW:         1.00 cm LV E/e' medial:  11.4 LV IVS:        1.00 cm LV e' lateral:   8.05 cm/s                        LV E/e' lateral: 10.5  IVC IVC diam: 1.60 cm LEFT ATRIUM           Index LA diam:      3.90 cm 2.40 cm/m LA Vol (A2C): 38.1 ml 23.45 ml/m  AORTIC VALVE                   PULMONIC VALVE AV Vmax:           124.00 cm/s PV Vmax:       1.32 m/s AV Vmean:          78.600 cm/s PV Peak grad:  7.0 mmHg AV VTI:            0.267 m AV Peak Grad:      6.2 mmHg AV Mean Grad:      3.0 mmHg LVOT Vmax:         94.60 cm/s LVOT Vmean:        54.400 cm/s LVOT VTI:          0.192 m LVOT/AV VTI ratio: 0.72  AORTA Ao Root diam: 2.90 cm Ao Asc diam:  3.80 cm MITRAL VALVE MV Area (PHT): 4.21 cm     SHUNTS  MV Decel Time: 180 msec     Systemic VTI: 0.19 m MV E velocity: 84.40 cm/s MV A velocity: 122.00 cm/s MV E/A ratio:  0.69 Gwyndolyn Kaufman MD Electronically signed by Gwyndolyn Kaufman MD Signature Date/Time: 05/01/2021/5:27:28 PM    Final     Cardiac Studies   LVEF preserved 05/01/21 Echo  Patient Profile     87 y.o. female with COPD and cough (on home O2) who presents in the setting of hypokalemia and Torasdes  Assessment & Plan    Syncope- with concern related to cough (see 1/13 consult) Torasdes Peri-arrest atrial fibrillation HfpEF HTN with DM COPD Chronic Hypoxic Respiratory Failure on 2 L  - in th setting of K 2.4, now 4 X2 - will check daly mag (last 2.1) - EKG today, will eval JTC - continue amiodarone: transitioning amiodarone form IV to PO (400 mg PO BID 10 days then 200 mg PO daily, may be stopped in outpatient setting - see 05/01/21 note for more details; presently no plan for lifevest or ICR - LVEF preserved, cardiac cause of etiology (with  negative ischemic work up in 2022) - if BP is still  elevated despite pain control, would increase irbesartan to higher dose    For questions or updates, please contact Rachel Please consult [email or web address] for contact info under Cardiology/STEMI.      Signed, Werner Lean, MD  05/02/2021, 8:02 AM

## 2021-05-02 NOTE — Progress Notes (Signed)
PROGRESS NOTE  [name] OYD:[id number] DOB: July 19, 1934 DOA: 04/30/2021 PCP: Aretta Nip, MD  HPI/Recap of past 47 hours: 87 year old F with PMH of hiatal hernia status postrepair, dysphagia, GERD, CHF, PE not on AC, chronic hypoxic RF on 2 L at bedtime, cognitive impairment and venous insufficiency brought to ED by EMS after an episode of 3-second LOC after coughing spell followed by generalized weakness.    While in ED, CODE BLUE called when patient became pale and slumped over but no LOC.  She had brief CPR.  No VT, VF or asystole on monitor.  EKG A. fib with RVR to 130s and prolonged QT to 540s but wide QRS.  She later developed A. fib with RVR and had 20 beats of nonsustained VT. Cardiology consulted.  She was started on amiodarone and admitted.  05/02/2021: Patient was seen and examined at bedside.  Reports having a chest pain from her chest compressions.  IV Dilaudid added as needed for severe pain, oxycodone for moderate pain.      Assessment/Plan: Principal Problem:   Syncope and collapse Active Problems:   Hypertension   GERD (gastroesophageal reflux disease)   Chronic respiratory failure (HCC)   Iron deficiency anemia   Gastro-esophageal reflux disease without esophagitis   S/P repair of paraesophageal hernia   Acute lower UTI   Diastolic dysfunction   Atrial fibrillation with RVR (HCC)   Non-sustained ventricular tachycardia   Pressure injury of skin  Syncope/brief LOC-initial episode at home looks like vasovagal after cough fit Paroxysmal A. fib with RVR-new onset. NSVT-about 20 beats of NSVT while in ED Prolonged QT/torsade de pointes-QTC ranges from 530s to 550s but exaggerated by wide QRS with RBBB -Cardiology managing -On amiodarone drip -Continue telemetry monitoring -Cardiology does not recommend anticoagulation given brief A. fib. -Follow echocardiogram LVEF 60 to 67% grade 1 diastolic dysfunction  TSH is pending -Optimize Mg and  potassium -Minimize or avoid QT prolonging drugs  AKI suspect prerenal in setting of poor oral intake from dehydration Baseline creatinine appears to be 0.9 with GFR greater than 60 Creatinine uptrending 1.34 with GFR 39. Start gentle IV fluid hydration LR 50 cc/h x 2 days. Monitor urine output Avoid nephrotoxic agents, dehydration and hypotension.   Dysphagia/GERD/nausea/history of hiatal hernia status postrepair:  Concern about possible stricture. Followed by [name] outpatient with plan for barium swallow -Follow SLP eval -Change PPI to IV -Low-dose Ativan as needed for nausea   Bacteriuria/hematuria/possible proteinuria-patient denies UTI symptoms.  No fever.  Mild leukocytosis likely demargination. -Doubt need for further antibiotics   Chronic diastolic CHF: TTE in 05/945 with LVEF of 60 to 65%, G1 DD and normal RVSP.  Not on diuretics at home.  Does not look fluid overloaded.  Very dark urine with ketonuria likely from dehydration -Follow repeat echocardiogram -Continue gentle IV fluid -Monitor fluid and respiratory status   Uncontrolled hypertension: SBP remains elevated in 170s to 200s -Increase Avapro to 150 mg daily -Continue IV hydralazine 10 mg every 6 hours per cardiology   Chronic COPD/chronic hypoxic RF: Wears 2 L at night.  Stable. -Continue breathing treatments and supplemental oxygen  Normocytic anemia: Drop in Hgb likely dilutional.  Patient and daughter denies melena or hematochezia. Recent Labs (within last 365 days)       Recent Labs    04/17/21 1813 04/30/21 1507 05/01/21 0520  HGB 10.0* 9.8* 8.3*    -Continue monitoring -Check anemia panel in the morning     Elevated anion gap:  Due to azotemia? -Continue monitoring   Elevated troponin: Likely demand ischemia from #1.  No acute ischemic finding on EKG. 2D echo completed, and reviewed. -Continue monitoring  Resolved post repletion: Severe hypokalemia:  K2.4 now 4.0.  Mg 2.0.  Patient is on  HCTZ at home -Discontinue HCTZ   GERD - Continue home PPI   Generalized weakness/physical deconditioning -PT/OT eval   Goal of care counseling: Discussed CODE STATUS with patient and patient's daughter at bedside.  Patient is not interested in CPR, DCCV or intubation but okay with ACLS meds and NIPPV. -Updated CODE STATUS in the computer.   Body mass index is 26.52 kg/m. DVT prophylaxis:  enoxaparin (LOVENOX) injection 40 mg Start: 05/01/21 1000   Code Status: Partial-no CPR, DCCV or intubation but ACLS meds and NIPPV Family Communication: Updated patient's daughter at bedside. Level of care: Stepdown Status is: Inpatient   Remains inpatient appropriate because: Due to syncope/arrhythmia requiring IV amiodarone and further evaluation     Final disposition: TBD       Consultants:  Cardiology     Objective: Vitals:   05/02/21 0600 05/02/21 0805 05/02/21 0959 05/02/21 1000  BP: (!) 157/55  (!) 131/45 (!) 136/43  Pulse: 72  81 87  Resp: 17  (!) 27 (!) 22  Temp:  (!) 97.3 F (36.3 C)    TempSrc:  Oral    SpO2: 97%  97% 98%  Weight:      Height:        Intake/Output Summary (Last 24 hours) at 05/02/2021 1039 Last data filed at 05/02/2021 0754 Gross per 24 hour  Intake 1869.12 ml  Output 500 ml  Net 1369.12 ml   Filed Weights   04/30/21 2245 05/01/21 1500  Weight: 63.7 kg 63.8 kg    Exam:  General: 87 y.o. year-old female frail-appearing no acute distress patient is alert and in pain from chest compressions.   Cardiovascular: Regular rate and rhythm no rubs or gallops. Respiratory: Clear to auscultation with no wheezes or rales. Good inspiratory effort. Abdomen: Soft nontender nondistended with normal bowel sounds x4 quadrants. Musculoskeletal: No lower extremity edema bilaterally.  Dependent edema in upper extremities bilaterally. Skin: No ulcerative lesions noted or rashes, Psychiatry: Mood is appropriate for condition and setting   Data  Reviewed: CBC: Recent Labs  Lab 04/30/21 1507 05/01/21 0520 05/01/21 1532  WBC 6.5 9.4 9.6  NEUTROABS 5.5  --   --   HGB 9.8* 8.3* 10.0*  HCT 29.5* 25.3* 31.6*  MCV 98.0 98.8 103.3*  PLT 443* 464* 893*   Basic Metabolic Panel: Recent Labs  Lab 04/30/21 1507 05/01/21 0520 05/01/21 1532 05/01/21 2001 05/02/21 0246  NA 137 139 137  --  138  K 4.2 2.4* 5.6* 4.3 4.0  CL 99 97* 106  --  102  CO2 21* 24 18*  --  26  GLUCOSE 153* 166* 172*  --  152*  BUN 35* 36* 35*  --  40*  CREATININE 0.94 1.04* 1.01*  --  1.34*  CALCIUM 8.7* 8.4* 8.4*  --  8.3*  MG  --  2.0 2.1  --   --   PHOS  --   --  2.9  --   --    GFR: Estimated Creatinine Clearance: 26.7 mL/min (A) (by C-G formula based on SCr of 1.34 mg/dL (H)). Liver Function Tests: Recent Labs  Lab 04/30/21 1507 05/01/21 0520 05/01/21 1532  AST 30 45*  --   ALT 15 35  --  ALKPHOS 202* 148*  --   BILITOT 2.8* 1.2  --   PROT 7.3 6.0*  --   ALBUMIN 3.2* 2.8* 3.0*   Recent Labs  Lab 04/30/21 1507  LIPASE 28   No results for input(s): AMMONIA in the last 168 hours. Coagulation Profile: No results for input(s): INR, PROTIME in the last 168 hours. Cardiac Enzymes: No results for input(s): CKTOTAL, CKMB, CKMBINDEX, TROPONINI in the last 168 hours. BNP (last 3 results) Recent Labs    07/08/20 1116 02/03/21 1510  PROBNP 350.0* 647   HbA1C: No results for input(s): HGBA1C in the last 72 hours. CBG: Recent Labs  Lab 04/30/21 2136 05/01/21 0518 05/02/21 0547  GLUCAP 157* 149* 150*   Lipid Profile: No results for input(s): CHOL, HDL, LDLCALC, TRIG, CHOLHDL, LDLDIRECT in the last 72 hours. Thyroid Function Tests: No results for input(s): TSH, T4TOTAL, FREET4, T3FREE, THYROIDAB in the last 72 hours. Anemia Panel: No results for input(s): VITAMINB12, FOLATE, FERRITIN, TIBC, IRON, RETICCTPCT in the last 72 hours. Urine analysis:    Component Value Date/Time   COLORURINE YELLOW 04/30/2021 1520   APPEARANCEUR HAZY  (A) 04/30/2021 1520   LABSPEC 1.015 04/30/2021 1520   PHURINE 5.0 04/30/2021 1520   GLUCOSEU NEGATIVE 04/30/2021 1520   HGBUR LARGE (A) 04/30/2021 1520   BILIRUBINUR NEGATIVE 04/30/2021 1520   KETONESUR 20 (A) 04/30/2021 1520   PROTEINUR >=300 (A) 04/30/2021 1520   UROBILINOGEN 0.2 06/28/2011 1358   NITRITE NEGATIVE 04/30/2021 1520   LEUKOCYTESUR NEGATIVE 04/30/2021 1520   Sepsis Labs: @LABRCNTIP (procalcitonin:4,lacticidven:4)  ) Recent Results (from the past 240 hour(s))  Urine Culture     Status: Abnormal (Preliminary result)   Collection Time: 04/30/21  3:20 PM   Specimen: Urine, Clean Catch  Result Value Ref Range Status   Specimen Description   Final    URINE, CLEAN CATCH Performed at [facility], [place] [address] [place], [place] [address]    Special Requests   Final    NONE Performed at [facility], [place] [address], [place], [place] [address]    Culture (A)  Final    >=100,000 COLONIES/mL GRAM NEGATIVE RODS IDENTIFICATION AND SUSCEPTIBILITIES TO FOLLOW Performed at [facility], [place] [address] [place], [place] [address]    Report Status PENDING  Incomplete  Resp Panel by RT-PCR (Flu A&B, Covid) Nasopharyngeal Swab     Status: None   Collection Time: 04/30/21  3:40 PM   Specimen: Nasopharyngeal Swab; Nasopharyngeal(NP) swabs in vial transport medium  Result Value Ref Range Status   SARS Coronavirus 2 by RT PCR NEGATIVE NEGATIVE Final    Comment: (NOTE) SARS-CoV-2 target nucleic acids are NOT DETECTED.  The SARS-CoV-2 RNA is generally detectable in upper respiratory specimens during the acute phase of infection. The lowest concentration of SARS-CoV-2 viral copies this assay can detect is 138 copies/mL. A negative result does not preclude SARS-Cov-2 infection and should not be used as the sole basis for treatment or other patient management decisions. A negative result may occur with  improper specimen  collection/handling, submission of specimen other than nasopharyngeal swab, presence of viral mutation(s) within the areas targeted by this assay, and inadequate number of viral copies(<138 copies/mL). A negative result must be combined with clinical observations, patient history, and epidemiological information. The expected result is Negative.  Fact Sheet for Patients:  EntrepreneurPulse.com.au  Fact Sheet for Healthcare Providers:  IncredibleEmployment.be  This test is no t yet approved or cleared by the [place]  FDA and  has been authorized for detection and/or diagnosis of SARS-CoV-2 by FDA under an Emergency Use Authorization (EUA). This EUA will remain  in effect (meaning this test can be used) for the duration of the COVID-19 declaration under Section 564(b)(1) of the Act, 21 U.S.C.section 360bbb-3(b)(1), unless the authorization is terminated  or revoked sooner.       Influenza A by PCR NEGATIVE NEGATIVE Final   Influenza B by PCR NEGATIVE NEGATIVE Final    Comment: (NOTE) The Xpert Xpress SARS-CoV-2/FLU/RSV plus assay is intended as an aid in the diagnosis of influenza from Nasopharyngeal swab specimens and should not be used as a sole basis for treatment. Nasal washings and aspirates are unacceptable for Xpert Xpress SARS-CoV-2/FLU/RSV testing.  Fact Sheet for Patients: EntrepreneurPulse.com.au  Fact Sheet for Healthcare Providers: IncredibleEmployment.be  This test is not yet approved or cleared by the [place] FDA and has been authorized for detection and/or diagnosis of SARS-CoV-2 by FDA under an Emergency Use Authorization (EUA). This EUA will remain in effect (meaning this test can be used) for the duration of the COVID-19 declaration under Section 564(b)(1) of the Act, 21 U.S.C. section 360bbb-3(b)(1), unless the authorization is terminated or revoked.  Performed at [facility], [place] [address] [place], [place] [address]   MRSA Next Gen by PCR, Nasal     Status: None   Collection Time: 05/01/21  2:44 PM   Specimen: Nasal Mucosa; Nasal Swab  Result Value Ref Range Status   MRSA by PCR Next Gen NOT DETECTED NOT DETECTED Final    Comment: (NOTE) The GeneXpert MRSA Assay (FDA approved for NASAL specimens only), is one component of a comprehensive MRSA colonization surveillance program. It is not intended to diagnose MRSA infection nor to guide or monitor treatment for MRSA infections. Test performance is not FDA approved in patients less than 61 years old. Performed at [facility], [place] [address] [place], [place] [address]       Studies: ECHOCARDIOGRAM COMPLETE  Result Date: 05/01/2021    ECHOCARDIOGRAM REPORT   Patient Name:   SHALIA BARTKO Date of Exam: 05/01/2021 Medical Rec #:  [id number]      Height:       61.0 in Accession #:    [id number]     Weight:       140.4 lb Date of Birth:  [birth year]      BSA:          1.625 m Patient Age:    4 years       BP:           200/70 mmHg Patient Gender: F              HR:           70 bpm. Exam Location:  Inpatient Procedure: 2D Echo, Cardiac Doppler and Color Doppler Indications:    R55 Syncope  History:        Patient has prior history of Echocardiogram examinations, most                 recent 10/22/2020. Arrythmias:Atrial Fibrillation and Tachycardia,                 Signs/Symptoms:Dyspnea and Edema; Risk Factors:Hypertension.  Sonographer:    Glo Herring Referring Phys: [id number] [place]  1. Left ventricular ejection fraction, by estimation, is 60 to 65%. The left ventricle has normal function. The left ventricle has no regional  wall motion abnormalities. Left ventricular diastolic parameters are consistent with Grade I diastolic dysfunction (impaired relaxation).  2. Right ventricular systolic function is normal. The right ventricular size is normal.  3.  The mitral valve is grossly normal. Mild mitral valve regurgitation.  4. The aortic valve is tricuspid. There is mild calcification of the aortic valve. There is mild thickening of the aortic valve. Aortic valve regurgitation is not visualized. Aortic valve sclerosis/calcification is present, without any evidence of aortic stenosis.  5. Aortic dilatation noted. There is mild dilatation of the ascending aorta, measuring 37 mm.  6. The inferior vena cava is normal in size with greater than 50% respiratory variability, suggesting right atrial pressure of 3 mmHg. Comparison(s): Compared to prior TTE in 10/2020, there is no significant change. FINDINGS  Left Ventricle: Left ventricular ejection fraction, by estimation, is 60 to 65%. The left ventricle has normal function. The left ventricle has no regional wall motion abnormalities. The left ventricular internal cavity size was normal in size. There is  no left ventricular hypertrophy. Left ventricular diastolic parameters are consistent with Grade I diastolic dysfunction (impaired relaxation). Right Ventricle: The right ventricular size is normal. No increase in right ventricular wall thickness. Right ventricular systolic function is normal. Left Atrium: Left atrial size was normal in size. Right Atrium: Right atrial size was normal in size. Pericardium: There is no evidence of pericardial effusion. Mitral Valve: The mitral valve is grossly normal. Mild mitral valve regurgitation. Tricuspid Valve: The tricuspid valve is normal in structure. Tricuspid valve regurgitation is trivial. Aortic Valve: The aortic valve is tricuspid. There is mild calcification of the aortic valve. There is mild thickening of the aortic valve. Aortic valve regurgitation is not visualized. Aortic valve sclerosis/calcification is present, without any evidence of aortic stenosis. Aortic valve mean gradient measures 3.0 mmHg. Aortic valve peak gradient measures 6.2 mmHg. Pulmonic Valve: The  pulmonic valve was normal in structure. Pulmonic valve regurgitation is not visualized. Aorta: Aortic dilatation noted. There is mild dilatation of the ascending aorta, measuring 37 mm. Venous: The inferior vena cava is normal in size with greater than 50% respiratory variability, suggesting right atrial pressure of 3 mmHg. IAS/Shunts: The atrial septum is grossly normal.  LEFT VENTRICLE PLAX 2D LVIDd:         4.80 cm Diastology LVIDs:         2.70 cm LV e' medial:    7.40 cm/s LV PW:         1.00 cm LV E/e' medial:  11.4 LV IVS:        1.00 cm LV e' lateral:   8.05 cm/s                        LV E/e' lateral: 10.5  IVC IVC diam: 1.60 cm LEFT ATRIUM           Index LA diam:      3.90 cm 2.40 cm/m LA Vol (A2C): 38.1 ml 23.45 ml/m  AORTIC VALVE                   PULMONIC VALVE AV Vmax:           124.00 cm/s PV Vmax:       1.32 m/s AV Vmean:          78.600 cm/s PV Peak grad:  7.0 mmHg AV VTI:            0.267 m AV Peak Grad:  6.2 mmHg AV Mean Grad:      3.0 mmHg LVOT Vmax:         94.60 cm/s LVOT Vmean:        54.400 cm/s LVOT VTI:          0.192 m LVOT/AV VTI ratio: 0.72  AORTA Ao Root diam: 2.90 cm Ao Asc diam:  3.80 cm MITRAL VALVE MV Area (PHT): 4.21 cm     SHUNTS MV Decel Time: 180 msec     Systemic VTI: 0.19 m MV E velocity: 84.40 cm/s MV A velocity: 122.00 cm/s MV E/A ratio:  0.69 Gwyndolyn Kaufman MD Electronically signed by Gwyndolyn Kaufman MD Signature Date/Time: 05/01/2021/5:27:28 PM    Final     Scheduled Meds:  [START ON 05/12/2021] amiodarone  200 mg Oral Daily   amiodarone  400 mg Oral BID   aspirin EC  81 mg Oral Daily   Chlorhexidine Gluconate Cloth  6 each Topical Daily   enoxaparin (LOVENOX) injection  40 mg Subcutaneous Q24H   fluticasone furoate-vilanterol  1 puff Inhalation Daily   irbesartan  150 mg Oral Daily   mouth rinse  15 mL Mouth Rinse BID   pantoprazole (PROTONIX) IV  40 mg Intravenous Q12H   senna-docusate  2 tablet Oral BID   sodium chloride flush  3 mL Intravenous  Q12H    Continuous Infusions:   LOS: 2 days     Kayleen Memos, MD [facility] Pager [phone]  If 7PM-7AM, please contact night-coverage [email or web address] Password [facility] 05/02/2021, 10:39 AM

## 2021-05-02 NOTE — Evaluation (Signed)
Occupational Therapy Evaluation Patient Details Name: [name] MRN: [id number] DOB: Sep 10, 1934 Today's Date: 05/02/2021   History of Present Illness 87 year old F with PMH of hiatal hernia status postrepair, dysphagia, GERD, CHF, PE not on AC, chronic hypoxic RF on 2 L at bedtime, cognitive impairment and venous insufficiency brought to ED by EMS after an episode of 3-second LOC after coughing spell followed by generalized weakness. Admitted for syncope and collapse. While in ED, [facility] called when patient became pale and slumped over but no LOC.  She had brief CPR.  No VT, VF or asystole on monitor.  She later developed A. fib with RVR and had 20 beats of nonsustained VT.   Clinical Impression   [name] is an 87 year old woman admitted to hospital with above medical history. On evaluation she presents on 5 L [place], exhibits generalized weakness, decreased activity tolerance, impaired balanced and has a lot of pain in sternum from compressions with all movement. She was max assist to transfer to side of bed and max x 2 to return to supine. She is able to perform grooming and feeding with setup but needs increased assistance for all other ADLs at bed level at this time. She was weak at edge of bed with poor balance, limited by pain and elevated BP at 192/122. Further activity deferred until BP more controlled. Patient will benefit from skilled OT services while in hospital to improve deficits and learn compensatory strategies as needed in order to return to PLOF.  Recommend short term rehab at discharge.      Recommendations for follow up therapy are one component of a multi-disciplinary discharge planning process, led by the attending physician.  Recommendations may be updated based on patient status, additional functional criteria and insurance authorization.   Follow Up Recommendations  Skilled nursing-short term rehab (<3 hours/day)    Assistance Recommended at Discharge Frequent  or constant Supervision/Assistance  Patient can return home with the following A lot of help with bathing/dressing/bathroom;Assistance with cooking/housework;Two people to help with walking and/or transfers    Functional Status Assessment  Patient has had a recent decline in their functional status and demonstrates the ability to make significant improvements in function in a reasonable and predictable amount of time.  Equipment Recommendations  Other (comment) (Defer to Next Venue)    Recommendations for Other Services       Precautions / Restrictions Precautions Precautions: Fall Precaution Comments: sternal pain Restrictions Weight Bearing Restrictions: No      Mobility Bed Mobility                    Transfers                          Balance Overall balance assessment: Needs assistance Sitting-balance support: No upper extremity supported;Feet unsupported Sitting balance-Leahy Scale: Poor Sitting balance - Comments: reliant on therapist support to sit upright from generalized weakness                                   ADL either performed or assessed with clinical judgement   ADL Overall ADL's : Needs assistance/impaired Eating/Feeding: Set up;Bed level   Grooming: Set up;Wash/dry face;Bed level   Upper Body Bathing: Moderate assistance;Bed level   Lower Body Bathing: Maximal assistance;Bed level   Upper Body Dressing : Maximal assistance;Bed level   Lower  Body Dressing: Bed level;Total assistance     Toilet Transfer Details (indicate cue type and reason): deferred Toileting- Clothing Manipulation and Hygiene: Total assistance;Bed level       Functional mobility during ADLs: Maximal assistance General ADL Comments: Patient able to move legs in the general direction of the edge of bed but needed near max assist for trunk and pivoting of hips. Patient limited by sternal pain. Once at edge of bed patient complained of weakness  - leaning head down on chest or on the therapist. Patient's BP elevated at 192/122 therefore activity limited. Max x 2 to return to supine. Otherwise patient's vital signs stable. Patient on 5 L [place].     Vision   Vision Assessment?: No apparent visual deficits     Perception     Praxis      Pertinent Vitals/Pain Faces Pain Scale: Hurts whole lot Pain Location: sternal pain - with movement Pain Descriptors / Indicators: Stabbing;Sharp;Grimacing;Moaning Pain Intervention(s): Limited activity within patient's tolerance;Premedicated before session     Hand Dominance Right   Extremity/Trunk Assessment Upper Extremity Assessment Upper Extremity Assessment: Generalized weakness (WFL ROM, MMT not tested due to pain.)   Lower Extremity Assessment Lower Extremity Assessment: Defer to PT evaluation   Cervical / Trunk Assessment Cervical / Trunk Assessment: Kyphotic   Communication Communication Communication: No difficulties   Cognition Arousal/Alertness: Awake/alert Behavior During Therapy: WFL for tasks assessed/performed Overall Cognitive Status: Within Functional Limits for tasks assessed                                       General Comments       Exercises     Shoulder Instructions      Home Living Family/patient expects to be discharged to:: Skilled nursing facility                                 Additional Comments: Lives in a single level home with 4 steps with a left sided rail to get in house. has a walk in shower with grab bars and seat. Low toilet. reports predominantly sedentary lifestyle. Lives with husband.      Prior Functioning/Environment Prior Level of Function : Independent/Modified Independent             Mobility Comments: use of cane and holding on to husband's arm ADLs Comments: independent        OT Problem List: Decreased strength;Decreased activity tolerance;Impaired balance (sitting and/or  standing);Decreased knowledge of use of DME or AE;Decreased knowledge of precautions;Cardiopulmonary status limiting activity;Pain      OT Treatment/Interventions: Self-care/ADL training;Therapeutic exercise;DME and/or AE instruction;Therapeutic activities;Patient/family education    OT Goals(Current goals can be found in the care plan section) Acute Rehab OT Goals Patient Stated Goal: get stronger, less pain OT Goal Formulation: With patient Time For Goal Achievement: 05/16/21 Potential to Achieve Goals: Good  OT Frequency: Min 2X/week    Co-evaluation              AM-PAC OT "6 Clicks" Daily Activity     Outcome Measure Help from another person eating meals?: A Little Help from another person taking care of personal grooming?: A Little Help from another person toileting, which includes using toliet, bedpan, or urinal?: Total Help from another person bathing (including washing, rinsing, drying)?: A Lot Help from another person to  put on and taking off regular upper body clothing?: A Lot Help from another person to put on and taking off regular lower body clothing?: Total 6 Click Score: 12   End of Session Nurse Communication: Mobility status  Activity Tolerance: Patient limited by fatigue;Patient limited by pain Patient left: in bed;with call bell/phone within reach;with bed alarm set;with family/visitor present  OT Visit Diagnosis: Muscle weakness (generalized) (M62.81);Pain                Time: 3552-1747 OT Time Calculation (min): 31 min Charges:  OT General Charges $OT Visit: 1 Visit OT Evaluation $OT Eval Low Complexity: 1 Low OT Treatments $Self Care/Home Management : 8-22 mins  Kestrel Mis, OTR/L [place]  Office [phone] Pager: [place] 05/02/2021, 10:41 AM

## 2021-05-02 NOTE — Progress Notes (Signed)
.. °  Transition of Care [facility]) Screening Note   Patient Details  Name: [name] Date of Birth: 18-Oct-1934   [facility]) CM/SW Contact:    Illene Regulus, LCSW Phone Number: 05/02/2021, 10:20 AM    Transition of Care Department [facility]) has reviewed patient and no TOC needs have been identified at this time. We will continue to monitor patient advancement through interdisciplinary progression rounds. If new patient transition needs arise, please place a TOC consult.

## 2021-05-03 ENCOUNTER — Inpatient Hospital Stay (HOSPITAL_COMMUNITY): Payer: Medicare PPO

## 2021-05-03 DIAGNOSIS — R55 Syncope and collapse: Secondary | ICD-10-CM | POA: Diagnosis not present

## 2021-05-03 LAB — MAGNESIUM: Magnesium: 1.9 mg/dL (ref 1.7–2.4)

## 2021-05-03 NOTE — Progress Notes (Signed)
Progress Note  Patient Name: RIGBY SWAMY Date of Encounter: 05/03/2021  Primary Cardiologist: Jenne Campus, MD   Subjective   In interim, reviewed care with patient's husband. No further ectopy.  Patient notes she her cough is persistent.  Inpatient Medications    Scheduled Meds:  [START ON 05/12/2021] amiodarone  200 mg Oral Daily   amiodarone  400 mg Oral BID   aspirin EC  81 mg Oral Daily   Chlorhexidine Gluconate Cloth  6 each Topical Daily   enoxaparin (LOVENOX) injection  30 mg Subcutaneous Q24H   fluticasone furoate-vilanterol  1 puff Inhalation Daily   irbesartan  150 mg Oral Daily   mouth rinse  15 mL Mouth Rinse BID   pantoprazole (PROTONIX) IV  40 mg Intravenous Q12H   senna-docusate  2 tablet Oral BID   sodium chloride flush  3 mL Intravenous Q12H   Continuous Infusions:  cefTRIAXone (ROCEPHIN)  IV Stopped (05/02/21 1914)   lactated ringers Stopped (05/02/21 1840)   PRN Meds: acetaminophen **OR** acetaminophen, albuterol, hydrALAZINE, HYDROmorphone (DILAUDID) injection, labetalol, LORazepam, oxyCODONE, polyethylene glycol   Vital Signs    Vitals:   05/03/21 0501 05/03/21 0531 05/03/21 0600 05/03/21 0700  BP:   (!) 154/49 (!) 149/54  Pulse: 77 82 85 82  Resp: (!) 29 (!) 26 (!) 27 (!) 37  Temp:      TempSrc:      SpO2: 97% 93% 96% 95%  Weight:      Height:        Intake/Output Summary (Last 24 hours) at 05/03/2021 0749 Last data filed at 05/03/2021 0400 Gross per 24 hour  Intake 1538.27 ml  Output 475 ml  Net 1063.27 ml   Filed Weights   04/30/21 2245 05/01/21 1500 05/03/21 0500  Weight: 63.7 kg 63.8 kg 67.9 kg    Telemetry    SR - Personally Reviewed  ECG    SR 73 RBBB QTc 485 JTC 355 ([place])  Physical Exam   Gen: mild distress, elderly female   Neck: No JVD,  Cardiac: No Rubs or Gallops, no Murmur, regular rhythm +2 radial pulses Respiratory: Clear to auscultation bilaterally, normal effort, normal  respiratory rate GI:  Soft, nontender, non-distended  MS: No  edema;  moves all extremities Integument: Skin feels warm Neuro:  At time of evaluation, alert and oriented to person/place/time/situation  Psych: Normal affect, patient feels in pain   Labs    Chemistry Recent Labs  Lab 04/30/21 1507 05/01/21 0520 05/01/21 1532 05/01/21 2001 05/02/21 0246  NA 137 139 137  --  138  K 4.2 2.4* 5.6* 4.3 4.0  CL 99 97* 106  --  102  CO2 21* 24 18*  --  26  GLUCOSE 153* 166* 172*  --  152*  BUN 35* 36* 35*  --  40*  CREATININE 0.94 1.04* 1.01*  --  1.34*  CALCIUM 8.7* 8.4* 8.4*  --  8.3*  PROT 7.3 6.0*  --   --   --   ALBUMIN 3.2* 2.8* 3.0*  --   --   AST 30 45*  --   --   --   ALT 15 35  --   --   --   ALKPHOS 202* 148*  --   --   --   BILITOT 2.8* 1.2  --   --   --   GFRNONAA 60* 53* 55*  --  39*  ANIONGAP 17* 18* 13  --  10  Hematology Recent Labs  Lab 04/30/21 1507 05/01/21 0520 05/01/21 1532  WBC 6.5 9.4 9.6  RBC 3.01* 2.56* 3.06*  HGB 9.8* 8.3* 10.0*  HCT 29.5* 25.3* 31.6*  MCV 98.0 98.8 103.3*  MCH 32.6 32.4 32.7  MCHC 33.2 32.8 31.6  RDW 14.5 14.2 14.5  PLT 443* 464* 456*    Cardiac EnzymesNo results for input(s): TROPONINI in the last 168 hours. No results for input(s): TROPIPOC in the last 168 hours.   BNPNo results for input(s): BNP, PROBNP in the last 168 hours.   DDimer No results for input(s): DDIMER in the last 168 hours.   Radiology    ECHOCARDIOGRAM COMPLETE  Result Date: 05/01/2021    ECHOCARDIOGRAM REPORT   Patient Name:   AMANIE MCCULLEY Date of Exam: 05/01/2021 Medical Rec #:  [id number]      Height:       61.0 in Accession #:    [id number]     Weight:       140.4 lb Date of Birth:  1934/05/09      BSA:          1.625 m Patient Age:    87 years       BP:           200/70 mmHg Patient Gender: F              HR:           70 bpm. Exam Location:  Inpatient Procedure: 2D Echo, Cardiac Doppler and Color Doppler Indications:    R55 Syncope  History:        Patient has  prior history of Echocardiogram examinations, most                 recent 10/22/2020. Arrythmias:Atrial Fibrillation and Tachycardia,                 Signs/Symptoms:Dyspnea and Edema; Risk Factors:Hypertension.  Sonographer:    Glo Herring Referring Phys: [id number] [place]  1. Left ventricular ejection fraction, by estimation, is 60 to 65%. The left ventricle has normal function. The left ventricle has no regional wall motion abnormalities. Left ventricular diastolic parameters are consistent with Grade I diastolic dysfunction (impaired relaxation).  2. Right ventricular systolic function is normal. The right ventricular size is normal.  3. The mitral valve is grossly normal. Mild mitral valve regurgitation.  4. The aortic valve is tricuspid. There is mild calcification of the aortic valve. There is mild thickening of the aortic valve. Aortic valve regurgitation is not visualized. Aortic valve sclerosis/calcification is present, without any evidence of aortic stenosis.  5. Aortic dilatation noted. There is mild dilatation of the ascending aorta, measuring 37 mm.  6. The inferior vena cava is normal in size with greater than 50% respiratory variability, suggesting right atrial pressure of 3 mmHg. Comparison(s): Compared to prior TTE in 10/2020, there is no significant change. FINDINGS  Left Ventricle: Left ventricular ejection fraction, by estimation, is 60 to 65%. The left ventricle has normal function. The left ventricle has no regional wall motion abnormalities. The left ventricular internal cavity size was normal in size. There is  no left ventricular hypertrophy. Left ventricular diastolic parameters are consistent with Grade I diastolic dysfunction (impaired relaxation). Right Ventricle: The right ventricular size is normal. No increase in right ventricular wall thickness. Right ventricular systolic function is normal. Left Atrium: Left atrial size was normal in size. Right Atrium: Right  atrial size was normal in size.  Pericardium: There is no evidence of pericardial effusion. Mitral Valve: The mitral valve is grossly normal. Mild mitral valve regurgitation. Tricuspid Valve: The tricuspid valve is normal in structure. Tricuspid valve regurgitation is trivial. Aortic Valve: The aortic valve is tricuspid. There is mild calcification of the aortic valve. There is mild thickening of the aortic valve. Aortic valve regurgitation is not visualized. Aortic valve sclerosis/calcification is present, without any evidence of aortic stenosis. Aortic valve mean gradient measures 3.0 mmHg. Aortic valve peak gradient measures 6.2 mmHg. Pulmonic Valve: The pulmonic valve was normal in structure. Pulmonic valve regurgitation is not visualized. Aorta: Aortic dilatation noted. There is mild dilatation of the ascending aorta, measuring 37 mm. Venous: The inferior vena cava is normal in size with greater than 50% respiratory variability, suggesting right atrial pressure of 3 mmHg. IAS/Shunts: The atrial septum is grossly normal.  LEFT VENTRICLE PLAX 2D LVIDd:         4.80 cm Diastology LVIDs:         2.70 cm LV e' medial:    7.40 cm/s LV PW:         1.00 cm LV E/e' medial:  11.4 LV IVS:        1.00 cm LV e' lateral:   8.05 cm/s                        LV E/e' lateral: 10.5  IVC IVC diam: 1.60 cm LEFT ATRIUM           Index LA diam:      3.90 cm 2.40 cm/m LA Vol (A2C): 38.1 ml 23.45 ml/m  AORTIC VALVE                   PULMONIC VALVE AV Vmax:           124.00 cm/s PV Vmax:       1.32 m/s AV Vmean:          78.600 cm/s PV Peak grad:  7.0 mmHg AV VTI:            0.267 m AV Peak Grad:      6.2 mmHg AV Mean Grad:      3.0 mmHg LVOT Vmax:         94.60 cm/s LVOT Vmean:        54.400 cm/s LVOT VTI:          0.192 m LVOT/AV VTI ratio: 0.72  AORTA Ao Root diam: 2.90 cm Ao Asc diam:  3.80 cm MITRAL VALVE MV Area (PHT): 4.21 cm     SHUNTS MV Decel Time: 180 msec     Systemic VTI: 0.19 m MV E velocity: 84.40 cm/s MV A velocity:  122.00 cm/s MV E/A ratio:  0.69 Gwyndolyn Kaufman MD Electronically signed by Gwyndolyn Kaufman MD Signature Date/Time: 05/01/2021/5:27:28 PM    Final     Cardiac Studies   LVEF preserved 05/01/21 Echo  Patient Profile     87 y.o. female with COPD and cough (on home O2) who presents in the setting of hypokalemia and [place]  Assessment & Plan    Syncope- with concern related to cough (see 1/13 consult) [place] Peri-arrest atrial fibrillation HfpEF HTN with DM COPD Chronic Hypoxic Respiratory Failure on 2 L  - agree with no plan to return to HCTZ at DC - Jtc WNL - continue amiodarone: transitioning amiodarone form IV to PO (400 mg PO BID 10 days then 200 mg PO daily, may  be stopped in outpatient setting - see 05/01/21 note for more details; presently no plan for lifevest or ICD - if BP is still elevated despite pain control, would increase irbesartan to higher dose (pain has still been an issue) - on 05/04/21, will work to get post-discharge heart monitor and arrange f/u with Dr. Agustin Cree and his team - has historically had low potassium in the past. - discussed with patient and called daugther to review cardiac course   For questions or updates, please contact [place] Please consult [email or web address] for contact info under Cardiology/STEMI.      Signed, Werner Lean, MD  05/03/2021, 7:49 AM

## 2021-05-03 NOTE — Progress Notes (Addendum)
PROGRESS NOTE  [name] HBZ:[id number] DOB: 1934/10/16 DOA: 04/30/2021 PCP: Aretta Nip, MD  HPI/Recap of past 2 hours: 87 year old F with PMH of hiatal hernia status postrepair, dysphagia, GERD, CHF, PE not on AC, chronic hypoxic RF on 2 L at bedtime, cognitive impairment and venous insufficiency brought to ED by EMS after an episode of 3-second LOC after coughing spell followed by generalized weakness.    While in ED, CODE BLUE called when patient became pale and slumped over but no LOC.  She had brief CPR.  No VT, VF or asystole on monitor.  EKG A. fib with RVR to 130s and prolonged QT to 540s but wide QRS.  She later developed A. fib with RVR and had 20 beats of nonsustained VT. Cardiology consulted.  She was started on amiodarone and admitted.  05/03/2021: Patient was seen and examined at bedside.  She is alert and oriented x3.  She has a lot of pain in her chest particularly when she coughs.    Assessment/Plan: Principal Problem:   Syncope and collapse Active Problems:   Hypertension   GERD (gastroesophageal reflux disease)   Chronic respiratory failure (HCC)   Iron deficiency anemia   Gastro-esophageal reflux disease without esophagitis   S/P repair of paraesophageal hernia   Acute lower UTI   Diastolic dysfunction   Atrial fibrillation with RVR (HCC)   Non-sustained ventricular tachycardia   Pressure injury of skin  Syncope/brief LOC-initial episode at home looks like vasovagal after cough fit Paroxysmal A. fib with RVR-new onset. NSVT-about 20 beats of NSVT while in ED Prolonged QT/torsade de pointes-QTC ranges from 530s to 550s but exaggerated by wide QRS with RBBB -Cardiology managing -On amiodarone drip with plan to switch to oral -Continue telemetry monitoring -Cardiology does not recommend anticoagulation given brief A. fib. -2D echocardiogram LVEF 60 to 10% grade 1 diastolic dysfunction TSH ordered and pending. -Continue to optimize Mg and  potassium -Continue to minimize or avoid QT prolonging drugs  AKI suspect prerenal in setting of poor oral intake from dehydration Baseline creatinine appears to be 0.9 with GFR greater than 60 Creatinine uptrending 1.34 with GFR 39. Continue gentle IV fluid hydration LR 50 cc/h x 2 days. Continue to monitor urine output Continue to avoid nephrotoxic agents, dehydration and hypotension. Repeat BMP in the morning   Dysphagia/GERD/nausea/history of hiatal hernia status postrepair:  Concern about possible stricture. Followed by [name] outpatient with plan for barium swallow -Follow SLP eval -Change PPI to IV -Low-dose Ativan as needed for nausea   Bacteriuria/hematuria/possible proteinuria-patient denies UTI symptoms.  No fever.  Mild leukocytosis likely demargination. -Doubt need for further antibiotics   Chronic diastolic CHF: TTE in 04/7508 with LVEF of 60 to 65%, G1 DD and normal RVSP.  Not on diuretics at home.  Does not look fluid overloaded.  Very dark urine with ketonuria likely from dehydration -Follow repeat echocardiogram -Continue gentle IV fluid and closely monitor volume status while on IV fluid. -Monitor fluid and respiratory status   Resolved uncontrolled hypertension, BP is now at goal:  Initially SBP elevated in 170s to 200s -Increase Avapro to 150 mg daily -Continue IV hydralazine 10 mg every 6 hours per cardiology   Chronic COPD/chronic hypoxic RF: Wears 2 L at night.  Stable. -Continue breathing treatments and supplemental oxygen  Normocytic anemia: Drop in Hgb likely dilutional.  Patient and daughter denies melena or hematochezia. -Continue monitoring H&H. -Check anemia panel in the morning    Resolved elevated anion  gap:  -Continue monitoring   Elevated troponin: Likely demand ischemia from #1.  No acute ischemic finding on EKG. 2D echo completed, and reviewed. -Continue monitoring Cardiology following, management per cardiology.  Resolved post  repletion: Severe hypokalemia:  K2.4 now 4.0.  Mg 2.0.  Patient is on HCTZ at home -Discontinue HCTZ   GERD - Continue home PPI   Generalized weakness/physical deconditioning -PT/OT eval  E. coli UTI Urine culture positive for greater than 100,000 colonies of E. coli, pansensitive She is asymptomatic Complete 3 days of Rocephin   Goal of care counseling: Discussed CODE STATUS with patient and patient's daughter at bedside.  Patient is not interested in CPR, DCCV or intubation but okay with ACLS meds and NIPPV. -Updated CODE STATUS in the computer.   Body mass index is 26.52 kg/m. DVT prophylaxis:  enoxaparin (LOVENOX) injection 40 mg Start: 05/01/21 1000   Code Status: Partial-no CPR, DCCV or intubation but ACLS meds and NIPPV Family Communication: None at bedside.  Level of care: Stepdown Status is: Inpatient   Remains inpatient appropriate because: Due to syncope/arrhythmia requiring IV amiodarone and further evaluation     Final disposition: TBD       Consultants:  Cardiology     Objective: Vitals:   05/03/21 0835 05/03/21 1200 05/03/21 1235 05/03/21 1300  BP:  (!) 135/54  (!) 137/56  Pulse:  79  76  Resp:  (!) 21  20  Temp: 98.4 F (36.9 C)  (!) 97.5 F (36.4 C)   TempSrc: Axillary  Oral   SpO2:  95%  93%  Weight:      Height:        Intake/Output Summary (Last 24 hours) at 05/03/2021 1343 Last data filed at 05/03/2021 1300 Gross per 24 hour  Intake 1016.02 ml  Output 300 ml  Net 716.02 ml   Filed Weights   04/30/21 2245 05/01/21 1500 05/03/21 0500  Weight: 63.7 kg 63.8 kg 67.9 kg    Exam:  General: 87 y.o. year-old female frail-appearing uncomfortable due to chest pain.   Cardiovascular: Regular rate and rhythm no rubs or gallops.   Respiratory: Mild rales at bases no wheezing noted..  Abdomen: Soft monitor normal bowel sounds present.   Musculoskeletal: Trace edema in lower extremities bilaterally.   Skin: No ulcerative lesions noted.    Psychiatry: Mood is appropriate for condition 7.  Data Reviewed: CBC: Recent Labs  Lab 04/30/21 1507 05/01/21 0520 05/01/21 1532  WBC 6.5 9.4 9.6  NEUTROABS 5.5  --   --   HGB 9.8* 8.3* 10.0*  HCT 29.5* 25.3* 31.6*  MCV 98.0 98.8 103.3*  PLT 443* 464* 562*   Basic Metabolic Panel: Recent Labs  Lab 04/30/21 1507 05/01/21 0520 05/01/21 1532 05/01/21 2001 05/02/21 0246 05/03/21 0252  NA 137 139 137  --  138  --   K 4.2 2.4* 5.6* 4.3 4.0  --   CL 99 97* 106  --  102  --   CO2 21* 24 18*  --  26  --   GLUCOSE 153* 166* 172*  --  152*  --   BUN 35* 36* 35*  --  40*  --   CREATININE 0.94 1.04* 1.01*  --  1.34*  --   CALCIUM 8.7* 8.4* 8.4*  --  8.3*  --   MG  --  2.0 2.1  --   --  1.9  PHOS  --   --  2.9  --   --   --  GFR: Estimated Creatinine Clearance: 27.5 mL/min (A) (by C-G formula based on SCr of 1.34 mg/dL (H)). Liver Function Tests: Recent Labs  Lab 04/30/21 1507 05/01/21 0520 05/01/21 1532  AST 30 45*  --   ALT 15 35  --   ALKPHOS 202* 148*  --   BILITOT 2.8* 1.2  --   PROT 7.3 6.0*  --   ALBUMIN 3.2* 2.8* 3.0*   Recent Labs  Lab 04/30/21 1507  LIPASE 28   No results for input(s): AMMONIA in the last 168 hours. Coagulation Profile: No results for input(s): INR, PROTIME in the last 168 hours. Cardiac Enzymes: No results for input(s): CKTOTAL, CKMB, CKMBINDEX, TROPONINI in the last 168 hours. BNP (last 3 results) Recent Labs    07/08/20 1116 02/03/21 1510  PROBNP 350.0* 647   HbA1C: No results for input(s): HGBA1C in the last 72 hours. CBG: Recent Labs  Lab 04/30/21 2136 05/01/21 0518 05/02/21 0547  GLUCAP 157* 149* 150*   Lipid Profile: No results for input(s): CHOL, HDL, LDLCALC, TRIG, CHOLHDL, LDLDIRECT in the last 72 hours. Thyroid Function Tests: No results for input(s): TSH, T4TOTAL, FREET4, T3FREE, THYROIDAB in the last 72 hours. Anemia Panel: No results for input(s): VITAMINB12, FOLATE, FERRITIN, TIBC, IRON, RETICCTPCT in  the last 72 hours. Urine analysis:    Component Value Date/Time   COLORURINE YELLOW 04/30/2021 1520   APPEARANCEUR HAZY (A) 04/30/2021 1520   LABSPEC 1.015 04/30/2021 1520   PHURINE 5.0 04/30/2021 1520   GLUCOSEU NEGATIVE 04/30/2021 1520   HGBUR LARGE (A) 04/30/2021 1520   BILIRUBINUR NEGATIVE 04/30/2021 1520   KETONESUR 20 (A) 04/30/2021 1520   PROTEINUR >=300 (A) 04/30/2021 1520   UROBILINOGEN 0.2 06/28/2011 1358   NITRITE NEGATIVE 04/30/2021 1520   LEUKOCYTESUR NEGATIVE 04/30/2021 1520   Sepsis Labs: @LABRCNTIP (procalcitonin:4,lacticidven:4)  ) Recent Results (from the past 240 hour(s))  Urine Culture     Status: Abnormal   Collection Time: 04/30/21  3:20 PM   Specimen: Urine, Clean Catch  Result Value Ref Range Status   Specimen Description   Final    URINE, CLEAN CATCH Performed at [facility], [place] [address], [place], [place] [address]    Special Requests   Final    NONE Performed at [facility], [place] [address] [place], [place] [address]    Culture >=100,000 COLONIES/mL ESCHERICHIA COLI (A)  Final   Report Status 05/02/2021 FINAL  Final   Organism ID, Bacteria ESCHERICHIA COLI (A)  Final      Susceptibility   Escherichia coli - MIC*    AMPICILLIN 4 SENSITIVE Sensitive     CEFAZOLIN <=4 SENSITIVE Sensitive     CEFEPIME <=0.12 SENSITIVE Sensitive     CEFTRIAXONE <=0.25 SENSITIVE Sensitive     CIPROFLOXACIN <=0.25 SENSITIVE Sensitive     GENTAMICIN <=1 SENSITIVE Sensitive     IMIPENEM <=0.25 SENSITIVE Sensitive     NITROFURANTOIN <=16 SENSITIVE Sensitive     TRIMETH/SULFA <=20 SENSITIVE Sensitive     AMPICILLIN/SULBACTAM <=2 SENSITIVE Sensitive     PIP/TAZO <=4 SENSITIVE Sensitive     * >=100,000 COLONIES/mL ESCHERICHIA COLI  Resp Panel by RT-PCR (Flu A&B, Covid) Nasopharyngeal Swab     Status: None   Collection Time: 04/30/21  3:40 PM   Specimen: Nasopharyngeal Swab; Nasopharyngeal(NP) swabs in vial transport medium   Result Value Ref Range Status   SARS Coronavirus 2 by RT PCR NEGATIVE NEGATIVE Final    Comment: (NOTE) SARS-CoV-2 target nucleic acids are NOT  DETECTED.  The SARS-CoV-2 RNA is generally detectable in upper respiratory specimens during the acute phase of infection. The lowest concentration of SARS-CoV-2 viral copies this assay can detect is 138 copies/mL. A negative result does not preclude SARS-Cov-2 infection and should not be used as the sole basis for treatment or other patient management decisions. A negative result may occur with  improper specimen collection/handling, submission of specimen other than nasopharyngeal swab, presence of viral mutation(s) within the areas targeted by this assay, and inadequate number of viral copies(<138 copies/mL). A negative result must be combined with clinical observations, patient history, and epidemiological information. The expected result is Negative.  Fact Sheet for Patients:  EntrepreneurPulse.com.au  Fact Sheet for Healthcare Providers:  IncredibleEmployment.be  This test is no t yet approved or cleared by the [place] FDA and  has been authorized for detection and/or diagnosis of SARS-CoV-2 by FDA under an Emergency Use Authorization (EUA). This EUA will remain  in effect (meaning this test can be used) for the duration of the COVID-19 declaration under Section 564(b)(1) of the Act, 21 U.S.C.section 360bbb-3(b)(1), unless the authorization is terminated  or revoked sooner.       Influenza A by PCR NEGATIVE NEGATIVE Final   Influenza B by PCR NEGATIVE NEGATIVE Final    Comment: (NOTE) The Xpert Xpress SARS-CoV-2/FLU/RSV plus assay is intended as an aid in the diagnosis of influenza from Nasopharyngeal swab specimens and should not be used as a sole basis for treatment. Nasal washings and aspirates are unacceptable for Xpert Xpress SARS-CoV-2/FLU/RSV testing.  Fact Sheet for  Patients: EntrepreneurPulse.com.au  Fact Sheet for Healthcare Providers: IncredibleEmployment.be  This test is not yet approved or cleared by the [place] FDA and has been authorized for detection and/or diagnosis of SARS-CoV-2 by FDA under an Emergency Use Authorization (EUA). This EUA will remain in effect (meaning this test can be used) for the duration of the COVID-19 declaration under Section 564(b)(1) of the Act, 21 U.S.C. section 360bbb-3(b)(1), unless the authorization is terminated or revoked.  Performed at [facility], [place] [address] [place], [place] [address]   MRSA Next Gen by PCR, Nasal     Status: None   Collection Time: 05/01/21  2:44 PM   Specimen: Nasal Mucosa; Nasal Swab  Result Value Ref Range Status   MRSA by PCR Next Gen NOT DETECTED NOT DETECTED Final    Comment: (NOTE) The GeneXpert MRSA Assay (FDA approved for NASAL specimens only), is one component of a comprehensive MRSA colonization surveillance program. It is not intended to diagnose MRSA infection nor to guide or monitor treatment for MRSA infections. Test performance is not FDA approved in patients less than 83 years old. Performed at [facility], [place] [address] [place],  [address]       Studies: DG CHEST PORT 1 VIEW  Result Date: 05/03/2021 CLINICAL DATA:  Chest pain EXAM: PORTABLE CHEST 1 VIEW COMPARISON:  Previous studies including the examination of 04/30/2021 FINDINGS: Transverse diameter of heart is increased. There are no signs of pulmonary edema. There is improvement in aeration of left lower lung fields. No new focal pulmonary consolidation is seen. Small linear density in the right parahilar region may suggest minimal subsegmental atelectasis. There is blunting of left lateral CP angle. There is no pneumothorax. Left hemidiaphragm is elevated. IMPRESSION: There is interval improvement in aeration of  left lower lung fields suggesting resolution of atelectasis/pneumonia. Small left pleural effusion. Minimal subsegmental atelectasis is seen in the  right parahilar region. Electronically Signed   By: Elmer Picker M.D.   On: 05/03/2021 10:34    Scheduled Meds:  [START ON 05/12/2021] amiodarone  200 mg Oral Daily   amiodarone  400 mg Oral BID   aspirin EC  81 mg Oral Daily   Chlorhexidine Gluconate Cloth  6 each Topical Daily   enoxaparin (LOVENOX) injection  30 mg Subcutaneous Q24H   fluticasone furoate-vilanterol  1 puff Inhalation Daily   irbesartan  150 mg Oral Daily   mouth rinse  15 mL Mouth Rinse BID   pantoprazole (PROTONIX) IV  40 mg Intravenous Q12H   senna-docusate  2 tablet Oral BID   sodium chloride flush  3 mL Intravenous Q12H    Continuous Infusions:  cefTRIAXone (ROCEPHIN)  IV Stopped (05/02/21 1914)   lactated ringers 50 mL/hr at 05/03/21 1100     LOS: 3 days     Kayleen Memos, MD [facility] Pager [phone]  If 7PM-7AM, please contact night-coverage [email or web address] Password [facility] 05/03/2021, 1:43 PM

## 2021-05-03 NOTE — Plan of Care (Signed)
°  Problem: Education: Goal: Knowledge of General Education information will improve Description: Including pain rating scale, medication(s)/side effects and non-pharmacologic comfort measures Outcome: Progressing   Problem: Nutrition: Goal: Adequate nutrition will be maintained Outcome: Not Progressing   Problem: Coping: Goal: Level of anxiety will decrease Outcome: Progressing   Problem: Pain Managment: Goal: General experience of comfort will improve Outcome: Not Progressing

## 2021-05-04 ENCOUNTER — Other Ambulatory Visit: Payer: Self-pay | Admitting: Student

## 2021-05-04 ENCOUNTER — Encounter (HOSPITAL_COMMUNITY): Payer: Self-pay | Admitting: Internal Medicine

## 2021-05-04 ENCOUNTER — Inpatient Hospital Stay (HOSPITAL_COMMUNITY): Payer: Medicare PPO

## 2021-05-04 DIAGNOSIS — R55 Syncope and collapse: Secondary | ICD-10-CM | POA: Diagnosis not present

## 2021-05-04 DIAGNOSIS — I4721 Torsades de pointes: Secondary | ICD-10-CM

## 2021-05-04 LAB — COMPREHENSIVE METABOLIC PANEL
ALT: 19 U/L (ref 0–44)
AST: 17 U/L (ref 15–41)
Albumin: 2.4 g/dL — ABNORMAL LOW (ref 3.5–5.0)
Alkaline Phosphatase: 97 U/L (ref 38–126)
Anion gap: 9 (ref 5–15)
BUN: 37 mg/dL — ABNORMAL HIGH (ref 8–23)
CO2: 27 mmol/L (ref 22–32)
Calcium: 8 mg/dL — ABNORMAL LOW (ref 8.9–10.3)
Chloride: 99 mmol/L (ref 98–111)
Creatinine, Ser: 1.17 mg/dL — ABNORMAL HIGH (ref 0.44–1.00)
GFR, Estimated: 46 mL/min — ABNORMAL LOW (ref >60)
Glucose, Bld: 124 mg/dL — ABNORMAL HIGH (ref 70–99)
Potassium: 3.6 mmol/L (ref 3.5–5.1)
Sodium: 135 mmol/L (ref 135–145)
Total Bilirubin: 0.9 mg/dL (ref 0.3–1.2)
Total Protein: 5.1 g/dL — ABNORMAL LOW (ref 6.5–8.1)

## 2021-05-04 LAB — CBC WITH DIFFERENTIAL/PLATELET
Abs Immature Granulocytes: 0.3 10*3/uL — ABNORMAL HIGH (ref 0.00–0.07)
Basophils Absolute: 0 10*3/uL (ref 0.0–0.1)
Basophils Relative: 0 %
Eosinophils Absolute: 0.1 10*3/uL (ref 0.0–0.5)
Eosinophils Relative: 1 %
HCT: 27.7 % — ABNORMAL LOW (ref 36.0–46.0)
Hemoglobin: 8.7 g/dL — ABNORMAL LOW (ref 12.0–15.0)
Immature Granulocytes: 3 %
Lymphocytes Relative: 8 %
Lymphs Abs: 0.9 10*3/uL (ref 0.7–4.0)
MCH: 32 pg (ref 26.0–34.0)
MCHC: 31.4 g/dL (ref 30.0–36.0)
MCV: 101.8 fL — ABNORMAL HIGH (ref 80.0–100.0)
Monocytes Absolute: 0.8 10*3/uL (ref 0.1–1.0)
Monocytes Relative: 7 %
Neutro Abs: 9.3 10*3/uL — ABNORMAL HIGH (ref 1.7–7.7)
Neutrophils Relative %: 81 %
Platelets: 425 10*3/uL — ABNORMAL HIGH (ref 150–400)
RBC: 2.72 MIL/uL — ABNORMAL LOW (ref 3.87–5.11)
RDW: 14.8 % (ref 11.5–15.5)
WBC: 11.3 10*3/uL — ABNORMAL HIGH (ref 4.0–10.5)
nRBC: 0 % (ref 0.0–0.2)

## 2021-05-04 LAB — TSH: TSH: 2.201 u[IU]/mL (ref 0.350–4.500)

## 2021-05-04 LAB — BASIC METABOLIC PANEL
Anion gap: 8 (ref 5–15)
BUN: 39 mg/dL — ABNORMAL HIGH (ref 8–23)
CO2: 29 mmol/L (ref 22–32)
Calcium: 8 mg/dL — ABNORMAL LOW (ref 8.9–10.3)
Chloride: 97 mmol/L — ABNORMAL LOW (ref 98–111)
Creatinine, Ser: 1.22 mg/dL — ABNORMAL HIGH (ref 0.44–1.00)
GFR, Estimated: 44 mL/min — ABNORMAL LOW (ref >60)
Glucose, Bld: 157 mg/dL — ABNORMAL HIGH (ref 70–99)
Potassium: 3.9 mmol/L (ref 3.5–5.1)
Sodium: 134 mmol/L — ABNORMAL LOW (ref 135–145)

## 2021-05-04 LAB — MAGNESIUM: Magnesium: 2 mg/dL (ref 1.7–2.4)

## 2021-05-04 LAB — PHOSPHORUS: Phosphorus: 3.1 mg/dL (ref 2.5–4.6)

## 2021-05-04 LAB — GLUCOSE, CAPILLARY: Glucose-Capillary: 127 mg/dL — ABNORMAL HIGH (ref 70–99)

## 2021-05-04 MED ORDER — ENOXAPARIN SODIUM 40 MG/0.4ML IJ SOSY
40.0000 mg | PREFILLED_SYRINGE | INTRAMUSCULAR | Status: DC
  Administered 2021-05-05 – 2021-05-06 (×2): 40 mg via SUBCUTANEOUS
  Filled 2021-05-04 (×2): qty 0.4

## 2021-05-04 MED ORDER — IRBESARTAN 150 MG PO TABS
150.0000 mg | ORAL_TABLET | Freq: Once | ORAL | Status: AC
  Administered 2021-05-04: 150 mg via ORAL
  Filled 2021-05-04: qty 1

## 2021-05-04 MED ORDER — POTASSIUM CHLORIDE 20 MEQ PO PACK
40.0000 meq | PACK | Freq: Once | ORAL | Status: AC
  Administered 2021-05-04: 40 meq via ORAL
  Filled 2021-05-04: qty 2

## 2021-05-04 MED ORDER — OMEPRAZOLE 20 MG PO CPDR
40.0000 mg | DELAYED_RELEASE_CAPSULE | Freq: Two times a day (BID) | ORAL | Status: DC
  Administered 2021-05-04 – 2021-05-10 (×13): 40 mg via ORAL
  Filled 2021-05-04 (×15): qty 2

## 2021-05-04 MED ORDER — IRBESARTAN 300 MG PO TABS
300.0000 mg | ORAL_TABLET | Freq: Every day | ORAL | Status: DC
  Administered 2021-05-05 – 2021-05-06 (×2): 300 mg via ORAL
  Filled 2021-05-04 (×2): qty 1

## 2021-05-04 NOTE — Progress Notes (Signed)
Speech Language Pathology Treatment: Dysphagia  Patient Details Name: [name] MRN: [id number] DOB: 1934/06/04 Today's Date: 05/04/2021 Time: 5391-2258 SLP Time Calculation (min) (ACUTE ONLY): 20 min  Assessment / Plan / Recommendation Clinical Impression  Patient seen with daughter present for skilled ST session focused on dysphagia goals. Patient was sleeping but while SLP was talking to her daughter, she did wake up enough to converse and have some sips of water. SLP educated patient and daughter regarding importance of reducing use of oral suction to keep oral mucosa from getting too dry. Patient reported she liked the oral swabs when SLP mentioned them and she was given a toothette sponge soaked in water which she used to moisten mouth. SLP showed patient and daughter mouth rinse (alcohol free) packet and encouraged her to try it out. Patient did not exhibit any overt s/s aspiration or penetration with sips of water. SLP reviewed MBS report from 2018 as well as several esophagrams that have been completed over the years. Per daughter's report, patient was scheduled to have esophagram as outpatient on 1/19 and an EGD with possible dilation was scheduled preemptively in February. SLP suspects that patient's current swallowing difficulties are primarily esophageal in nature and do not recommend further ST involvement regarding her dysphagia. SLP notified patient's attending MD via secure EPIC chat and plan was for GI consultation. SLP to s/o at this time but please re-order if needed.   HPI HPI: 87yo female admitted 04/30/21 with syncope (3 second loss of consciousness afater a coughing spell - esophagram ordered for 05/07/21). Pt reports episodes of choking and vomiting after PO/meds. PMH: hiatal hernia, PE, pleural effusion, venous insufficiency, memory loss, anemia, HTN, GERD, diverticulosis, CHF, chronic respiratory failure.      SLP Plan  Discharge SLP treatment due to (comment);All goals  met (goals met)      Recommendations for follow up therapy are one component of a multi-disciplinary discharge planning process, led by the attending physician.  Recommendations may be updated based on patient status, additional functional criteria and insurance authorization.    Recommendations  Diet recommendations: Other(comment);Thin liquid (upgrade diet based on GI recommendations) Liquids provided via: Cup;Straw Medication Administration: Whole meds with liquid Supervision: Patient able to self feed Compensations: Minimize environmental distractions;Slow rate;Small sips/bites Postural Changes and/or Swallow Maneuvers: Seated upright 90 degrees;Upright 30-60 min after meal                Oral Care Recommendations: Oral care BID Follow Up Recommendations: No SLP follow up Assistance recommended at discharge: Intermittent Supervision/Assistance SLP Visit Diagnosis: Dysphagia, unspecified (R13.10) Plan: Discharge SLP treatment due to (comment);All goals met (goals met)           Sonia Baller, MA, CCC-SLP Speech Therapy
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
## 2021-05-04 NOTE — Progress Notes (Signed)
Ordered Event Monitor to assess for any recurrent ventricular arrhythmias. Please see rounding note today for additional information.  Darreld [name], PA-C 05/04/2021 12:51 PM

## 2021-05-04 NOTE — Progress Notes (Signed)
PROGRESS NOTE  [name] DXI:[id number] DOB: Sep 30, 1934 DOA: 04/30/2021 PCP: Aretta Nip, MD  HPI/Recap of past 81 hours: 87 year old F with PMH of hiatal hernia status postrepair, dysphagia, GERD, CHF, PE not on AC, chronic hypoxic RF on 2 L O2 [place] at bedtime, cognitive impairment and venous insufficiency brought to ED by EMS after an episode of 3-second LOC after coughing spell followed by generalized weakness.    While in ED, CODE BLUE called when patient became pale and slumped over but no LOC.  She had brief CPR.  No VT, VF or asystole on monitor.  EKG A. fib with RVR to 130s and prolonged QT to 540s but wide QRS.  She later developed A. fib with RVR and had 20 beats of nonsustained VT. Cardiology consulted.  She was started on amiodarone drip and admitted.  Amiodarone drip switched to oral amiodarone on 05/03/2021.  05/04/2021: Seen and examined at her bedside.  Her husband and daughter were present in the room.  Endorses chest pain when she has hiccups or coughs.    Assessment/Plan: Principal Problem:   Syncope and collapse Active Problems:   Hypertension   GERD (gastroesophageal reflux disease)   Chronic respiratory failure (HCC)   Iron deficiency anemia   Gastro-esophageal reflux disease without esophagitis   S/P repair of paraesophageal hernia   Acute lower UTI   Diastolic dysfunction   Atrial fibrillation with RVR (HCC)   Non-sustained ventricular tachycardia   Pressure injury of skin  Syncope/brief LOC-initial episode at home looks like vasovagal after cough fit Paroxysmal A. fib with RVR-new onset. NSVT-about 20 beats of NSVT while in ED Prolonged QT/torsade de pointes-QTC ranges from 530s to 550s but exaggerated by wide QRS with RBBB -Cardiology managing -On amiodarone drip with plan to switch to oral -Continue telemetry monitoring -Cardiology does not recommend anticoagulation given brief A. fib. -2D echocardiogram LVEF 60 to 76% grade 1 diastolic  dysfunction TSH within normal limit 2.2. -Continue to optimize Mg and potassium -Continue to minimize or avoid QT prolonging drugs  AKI suspect prerenal in setting of poor oral intake from dehydration Baseline creatinine appears to be 0.9 with GFR greater than 60 Creatinine uptrending 1.2 from 1.17. She is currently off IV fluid. Continue to monitor urine output Continue to avoid nephrotoxic agents, dehydration and hypotension. Repeat BMP in the morning   Dysphagia/GERD/nausea/history of hiatal hernia status postrepair:  Concern about possible stricture. Followed by [name] outpatient with plan for barium swallow -Follow SLP eval, recommended clear liquid diet. -Change PPI to IV GI [place] consulted on 05/04/2021.   E. coli UTI Urine culture positive for greater than 100,000 colonies of E. coli, pansensitive She is asymptomatic Complete 3 days of Rocephin   Chronic diastolic CHF: TTE in 10/3417 with LVEF of 60 to 65%, G1 DD and normal RVSP.  Not on diuretics at home.  Does not look fluid overloaded.  Very dark urine with ketonuria likely from dehydration -Follow repeat echocardiogram -Continue gentle IV fluid and closely monitor volume status while on IV fluid. -Monitor fluid and respiratory status   Resolved uncontrolled hypertension, BP is now at goal:  Initially SBP elevated in 170s to 200s -Increase Avapro to 150 mg daily -Continue IV hydralazine 10 mg every 6 hours per cardiology  Mild leukocytosis WBC 11.3 Monitor WBC and fever curve.  Acute drop in hemoglobin Hemoglobin 8.7 from 10.0 Monitor H&H No overt bleeding at this   Chronic COPD/chronic hypoxic RF: Wears 2 L at night.  Stable. -Continue breathing treatments and supplemental oxygen  Normocytic anemia: Drop in Hgb likely dilutional.  Patient and daughter denies melena or hematochezia. -Continue monitoring H&H. -Check anemia panel in the morning    Resolved elevated anion gap:  -Continue monitoring    Elevated troponin: Likely demand ischemia from #1.  No acute ischemic finding on EKG. 2D echo completed, and reviewed. -Continue monitoring Cardiology following, management per cardiology.  Resolved post repletion: Severe hypokalemia:  K2.4 now 4.0.  Mg 2.0.  Patient is on HCTZ at home -Discontinue HCTZ   GERD - Continue home PPI   Generalized weakness/physical deconditioning PT OT to evaluate Fall precautions.  Goal of care counseling: Discussed CODE STATUS with patient and patient's daughter at bedside.  Patient is not interested in CPR, DCCV or intubation but okay with ACLS meds and NIPPV. -Updated CODE STATUS in the computer.   Body mass index is 26.52 kg/m. DVT prophylaxis:  enoxaparin (LOVENOX) injection 40 mg Start: 05/01/21 1000   Code Status: Partial-no CPR, DCCV or intubation but ACLS meds and NIPPV Family Communication: None at bedside.  Level of care: Stepdown Status is: Inpatient   Remains inpatient appropriate because: Due to syncope/arrhythmia requiring IV amiodarone and further evaluation     Final disposition: TBD       Consultants:  Cardiology     Objective: Vitals:   05/04/21 1000 05/04/21 1100 05/04/21 1128 05/04/21 1200  BP: (!) 171/60 (!) 156/62  118/67  Pulse: 80 76 69 68  Resp: 19  15 10   Temp:    97.8 F (36.6 C)  TempSrc:    Axillary  SpO2: 96% 93% 97% 97%  Weight:      Height:        Intake/Output Summary (Last 24 hours) at 05/04/2021 1351 Last data filed at 05/04/2021 1000 Gross per 24 hour  Intake 514.7 ml  Output 500 ml  Net 14.7 ml   Filed Weights   05/01/21 1500 05/03/21 0500 05/04/21 0543  Weight: 63.8 kg 67.9 kg 67.6 kg    Exam:  General: 87 y.o. year-old female frail-appearing no acute stress.  She is alert and oriented x3.   Cardiovascular: Regular rate and rhythm no rubs or gallops.   Respiratory: Clear to auscultation with no wheezes or rales.  Poor inspiratory effort.   Musculoskeletal: Trace lower  extremity edema bilaterally.   Skin: No ulcerative lesions noted. Psychiatry: Mood is appropriate for condition and setting.  Data Reviewed: CBC: Recent Labs  Lab 04/30/21 1507 05/01/21 0520 05/01/21 1532 05/04/21 0604  WBC 6.5 9.4 9.6 11.3*  NEUTROABS 5.5  --   --  9.3*  HGB 9.8* 8.3* 10.0* 8.7*  HCT 29.5* 25.3* 31.6* 27.7*  MCV 98.0 98.8 103.3* 101.8*  PLT 443* 464* 456* 496*   Basic Metabolic Panel: Recent Labs  Lab 05/01/21 0520 05/01/21 1532 05/01/21 2001 05/02/21 0246 05/03/21 0252 05/04/21 0250 05/04/21 0604 05/04/21 1104  NA 139 137  --  138  --   --  135 134*  K 2.4* 5.6* 4.3 4.0  --   --  3.6 3.9  CL 97* 106  --  102  --   --  99 97*  CO2 24 18*  --  26  --   --  27 29  GLUCOSE 166* 172*  --  152*  --   --  124* 157*  BUN 36* 35*  --  40*  --   --  37* 39*  CREATININE 1.04* 1.01*  --  1.34*  --   --  1.17* 1.22*  CALCIUM 8.4* 8.4*  --  8.3*  --   --  8.0* 8.0*  MG 2.0 2.1  --   --  1.9 2.0  --   --   PHOS  --  2.9  --   --   --   --  3.1  --    GFR: Estimated Creatinine Clearance: 30.2 mL/min (A) (by C-G formula based on SCr of 1.22 mg/dL (H)). Liver Function Tests: Recent Labs  Lab 04/30/21 1507 05/01/21 0520 05/01/21 1532 05/04/21 0604  AST 30 45*  --  17  ALT 15 35  --  19  ALKPHOS 202* 148*  --  97  BILITOT 2.8* 1.2  --  0.9  PROT 7.3 6.0*  --  5.1*  ALBUMIN 3.2* 2.8* 3.0* 2.4*   Recent Labs  Lab 04/30/21 1507  LIPASE 28   No results for input(s): AMMONIA in the last 168 hours. Coagulation Profile: No results for input(s): INR, PROTIME in the last 168 hours. Cardiac Enzymes: No results for input(s): CKTOTAL, CKMB, CKMBINDEX, TROPONINI in the last 168 hours. BNP (last 3 results) Recent Labs    07/08/20 1116 02/03/21 1510  PROBNP 350.0* 647   HbA1C: No results for input(s): HGBA1C in the last 72 hours. CBG: Recent Labs  Lab 04/30/21 2136 05/01/21 0518 05/02/21 0547 05/04/21 0448  GLUCAP 157* 149* 150* 127*   Lipid  Profile: No results for input(s): CHOL, HDL, LDLCALC, TRIG, CHOLHDL, LDLDIRECT in the last 72 hours. Thyroid Function Tests: Recent Labs    05/04/21 0250  TSH 2.201   Anemia Panel: No results for input(s): VITAMINB12, FOLATE, FERRITIN, TIBC, IRON, RETICCTPCT in the last 72 hours. Urine analysis:    Component Value Date/Time   COLORURINE YELLOW 04/30/2021 1520   APPEARANCEUR HAZY (A) 04/30/2021 1520   LABSPEC 1.015 04/30/2021 1520   PHURINE 5.0 04/30/2021 1520   GLUCOSEU NEGATIVE 04/30/2021 1520   HGBUR LARGE (A) 04/30/2021 1520   BILIRUBINUR NEGATIVE 04/30/2021 1520   KETONESUR 20 (A) 04/30/2021 1520   PROTEINUR >=300 (A) 04/30/2021 1520   UROBILINOGEN 0.2 06/28/2011 1358   NITRITE NEGATIVE 04/30/2021 1520   LEUKOCYTESUR NEGATIVE 04/30/2021 1520   Sepsis Labs: @LABRCNTIP (procalcitonin:4,lacticidven:4)  ) Recent Results (from the past 240 hour(s))  Urine Culture     Status: Abnormal   Collection Time: 04/30/21  3:20 PM   Specimen: Urine, Clean Catch  Result Value Ref Range Status   Specimen Description   Final    URINE, CLEAN CATCH Performed at [facility], [place] [address] [place], [place] [address]    Special Requests   Final    NONE Performed at [facility], [place] [address], [place], [place] [address]    Culture >=100,000 COLONIES/mL ESCHERICHIA COLI (A)  Final   Report Status 05/02/2021 FINAL  Final   Organism ID, Bacteria ESCHERICHIA COLI (A)  Final      Susceptibility   Escherichia coli - MIC*    AMPICILLIN 4 SENSITIVE Sensitive     CEFAZOLIN <=4 SENSITIVE Sensitive     CEFEPIME <=0.12 SENSITIVE Sensitive     CEFTRIAXONE <=0.25 SENSITIVE Sensitive     CIPROFLOXACIN <=0.25 SENSITIVE Sensitive     GENTAMICIN <=1 SENSITIVE Sensitive     IMIPENEM <=0.25 SENSITIVE Sensitive     NITROFURANTOIN <=16 SENSITIVE Sensitive     TRIMETH/SULFA <=20 SENSITIVE Sensitive     AMPICILLIN/SULBACTAM <=2 SENSITIVE Sensitive     PIP/TAZO  <=  4 SENSITIVE Sensitive     * >=100,000 COLONIES/mL ESCHERICHIA COLI  Resp Panel by RT-PCR (Flu A&B, Covid) Nasopharyngeal Swab     Status: None   Collection Time: 04/30/21  3:40 PM   Specimen: Nasopharyngeal Swab; Nasopharyngeal(NP) swabs in vial transport medium  Result Value Ref Range Status   SARS Coronavirus 2 by RT PCR NEGATIVE NEGATIVE Final    Comment: (NOTE) SARS-CoV-2 target nucleic acids are NOT DETECTED.  The SARS-CoV-2 RNA is generally detectable in upper respiratory specimens during the acute phase of infection. The lowest concentration of SARS-CoV-2 viral copies this assay can detect is 138 copies/mL. A negative result does not preclude SARS-Cov-2 infection and should not be used as the sole basis for treatment or other patient management decisions. A negative result may occur with  improper specimen collection/handling, submission of specimen other than nasopharyngeal swab, presence of viral mutation(s) within the areas targeted by this assay, and inadequate number of viral copies(<138 copies/mL). A negative result must be combined with clinical observations, patient history, and epidemiological information. The expected result is Negative.  Fact Sheet for Patients:  EntrepreneurPulse.com.au  Fact Sheet for Healthcare Providers:  IncredibleEmployment.be  This test is no t yet approved or cleared by the [place] FDA and  has been authorized for detection and/or diagnosis of SARS-CoV-2 by FDA under an Emergency Use Authorization (EUA). This EUA will remain  in effect (meaning this test can be used) for the duration of the COVID-19 declaration under Section 564(b)(1) of the Act, 21 U.S.C.section 360bbb-3(b)(1), unless the authorization is terminated  or revoked sooner.       Influenza A by PCR NEGATIVE NEGATIVE Final   Influenza B by PCR NEGATIVE NEGATIVE Final    Comment: (NOTE) The Xpert Xpress SARS-CoV-2/FLU/RSV plus  assay is intended as an aid in the diagnosis of influenza from Nasopharyngeal swab specimens and should not be used as a sole basis for treatment. Nasal washings and aspirates are unacceptable for Xpert Xpress SARS-CoV-2/FLU/RSV testing.  Fact Sheet for Patients: EntrepreneurPulse.com.au  Fact Sheet for Healthcare Providers: IncredibleEmployment.be  This test is not yet approved or cleared by the [place] FDA and has been authorized for detection and/or diagnosis of SARS-CoV-2 by FDA under an Emergency Use Authorization (EUA). This EUA will remain in effect (meaning this test can be used) for the duration of the COVID-19 declaration under Section 564(b)(1) of the Act, 21 U.S.C. section 360bbb-3(b)(1), unless the authorization is terminated or revoked.  Performed at [facility], [place] [address], [place], [place] [address]   MRSA Next Gen by PCR, Nasal     Status: None   Collection Time: 05/01/21  2:44 PM   Specimen: Nasal Mucosa; Nasal Swab  Result Value Ref Range Status   MRSA by PCR Next Gen NOT DETECTED NOT DETECTED Final    Comment: (NOTE) The GeneXpert MRSA Assay (FDA approved for NASAL specimens only), is one component of a comprehensive MRSA colonization surveillance program. It is not intended to diagnose MRSA infection nor to guide or monitor treatment for MRSA infections. Test performance is not FDA approved in patients less than 3 years old. Performed at [facility], [place] [address] [place], [place] [address]       Studies: No results found.  Scheduled Meds:  [START ON 05/12/2021] amiodarone  200 mg Oral Daily   amiodarone  400 mg Oral BID   aspirin EC  81 mg Oral Daily   Chlorhexidine Gluconate Cloth  6 each  Topical Daily   [START ON 05/05/2021] enoxaparin (LOVENOX) injection  40 mg Subcutaneous Q24H   fluticasone furoate-vilanterol  1 puff Inhalation Daily   [START ON 05/05/2021]  irbesartan  300 mg Oral Daily   mouth rinse  15 mL Mouth Rinse BID   omeprazole  40 mg Oral BID   senna-docusate  2 tablet Oral BID   sodium chloride flush  3 mL Intravenous Q12H    Continuous Infusions:     LOS: 4 days     Kayleen Memos, MD [facility] Pager [phone]  If 7PM-7AM, please contact night-coverage [email or web address] Password [email or web address] 05/04/2021, 1:51 PM

## 2021-05-04 NOTE — Progress Notes (Signed)
The patient is receiving Protonix by the intravenous route.  Based on criteria approved by the Pharmacy and [place], the medication is being converted to the equivalent oral dose form.  These criteria include: -No active GI bleeding -Able to tolerate diet of full liquids (or better) or tube feeding -Able to tolerate other medications by the oral or enteral route  If you have any questions about this conversion, please contact the Pharmacy Department (phone [phone]).  Thank you.    Addendum: changed to omeprazole instead of pantoprazole - pt's home regimen

## 2021-05-04 NOTE — Evaluation (Signed)
Physical Therapy Evaluation Patient Details Name: [name] MRN: [id number] DOB: 1934/10/16 Today's Date: 05/04/2021  History of Present Illness  87 year old F with PMH of hiatal hernia status postrepair, dysphagia, GERD, CHF, PE not on AC, chronic hypoxic RF on 2 L at bedtime, cognitive impairment and venous insufficiency brought to ED by EMS after an episode of 3-second LOC after coughing spell followed by generalized weakness. Admitted for syncope and collapse. While in ED, CODE BLUE called when patient became pale and slumped over but no LOC.  She had brief CPR.  No VT, VF or asystole on monitor.  She later developed A. fib with RVR and had 20 beats of nonsustained VT.  Clinical Impression  The patient  presents resting in bed, HOB elevated.  Patient  recently received IV pain meds. Patient arouses when spoken to and able to follow directions with periodic stimulation to arouse.  Patient required max/total assistance to move to sitting on bed edge, sat ~ 3 minutes  with reports of increased dizziness. Patient also indicating pain in sternal area with mobility and coughing. Encouraged use of small pillow  to splint to cough. BP 161/67, SPO2 > 90% on 2 L [place]., RR in 20' Patient's spouse and daughter present.  Depending on progress, patient may require/benefit from  post acute rehab.  Pt admitted with above diagnosis.  Pt currently with functional limitations due to the deficits listed below (see PT Problem List). Pt will benefit from skilled PT to increase their independence and safety with mobility to allow discharge to the venue listed below.            Recommendations for follow up therapy are one component of a multi-disciplinary discharge planning process, led by the attending physician.  Recommendations may be updated based on patient status, additional functional criteria and insurance authorization.  Follow Up Recommendations Skilled nursing-short term rehab (<3 hours/day)     Assistance Recommended at Discharge Frequent or constant Supervision/Assistance  Patient can return home with the following       Equipment Recommendations None recommended by PT  Recommendations for Other Services       Functional Status Assessment Patient has had a recent decline in their functional status and demonstrates the ability to make significant improvements in function in a reasonable and predictable amount of time.     Precautions / Restrictions Precautions Precautions: Fall Precaution Comments: sternal pain      Mobility  Bed Mobility Overal bed mobility: Needs Assistance Bed Mobility: Supine to Sit;Sit to Supine     Supine to sit: Max assist;Total assist Sit to supine: Total assist   General bed mobility comments: patient initiated moving  legs to left bed edge, Max/total assist  and use of bed pad to cradle and "scoop" patient around to  and into sitting upright.. Trunk noted with tharacic flexed position. Patient complained of dizziness. BP 161/67. Total assist to return to  partial sitting  with HOB raised.    Transfers                   General transfer comment: NT will need alift    Ambulation/Gait                  Stairs            Wheelchair Mobility    Modified Rankin (Stroke Patients Only)       Balance Overall balance assessment: Needs assistance Sitting-balance support: No upper extremity supported;Feet unsupported Sitting  balance-Leahy Scale: Poor Sitting balance - Comments: froward flexed posture, support  by therapist. sat only ~ 3 minuttes                                     Pertinent Vitals/Pain Faces Pain Scale: Hurts worst Pain Location: sternal pain - with movement and cough Pain Descriptors / Indicators: Stabbing;Sharp;Grimacing;Moaning Pain Intervention(s): Monitored during session;Limited activity within patient's tolerance;Premedicated before session;Repositioned;Relaxation    Home  Living Family/patient expects to be discharged to:: Private residence Living Arrangements: Spouse/significant other Available Help at Discharge: Available 24 hours/day Type of Home: House Home Access: Stairs to enter Entrance Stairs-Rails: Left Entrance Stairs-Number of Steps: 3   Home Layout: One level Home Equipment: Rollator (4 wheels);Cane - single point;Cane - quad;Grab bars - tub/shower      Prior Function Prior Level of Function : Independent/Modified Independent             Mobility Comments: use of cane and holding on to husband's arm or rollator ADLs Comments: independent     Hand Dominance   Dominant Hand: Right    Extremity/Trunk Assessment   Upper Extremity Assessment Upper Extremity Assessment: Generalized weakness    Lower Extremity Assessment Lower Extremity Assessment: Generalized weakness    Cervical / Trunk Assessment Cervical / Trunk Assessment: Kyphotic  Communication   Communication: No difficulties  Cognition Arousal/Alertness: Suspect due to medications;Lethargic Behavior During Therapy: Flat affect Overall Cognitive Status: Within Functional Limits for tasks assessed                                 General Comments: O to place, mo.,  day not date        General Comments      Exercises General Exercises - Upper Extremity Shoulder Flexion: AAROM;AROM;Both;10 reps (HOB upright) General Exercises - Lower Extremity Ankle Circles/Pumps: AROM;Both;10 reps Short Arc QuadSinclair Ship;Both;5 reps Heel Slides: AAROM;Both;5 reps   Assessment/Plan    PT Assessment Patient needs continued PT services  PT Problem List Decreased strength;Decreased mobility;Decreased range of motion;Decreased knowledge of precautions;Decreased activity tolerance;Pain;Decreased balance       PT Treatment Interventions DME instruction;Therapeutic activities;Gait training;Therapeutic exercise;Patient/family education;Functional mobility  training;Balance training    PT Goals (Current goals can be found in the Care Plan section)  Acute Rehab PT Goals Patient Stated Goal: agreed with mobility to get back on her feet. PT Goal Formulation: With patient/family Time For Goal Achievement: 05/18/21 Potential to Achieve Goals: Fair    Frequency Min 2X/week     Co-evaluation               AM-PAC PT "6 Clicks" Mobility  Outcome Measure Help needed turning from your back to your side while in a flat bed without using bedrails?: Total Help needed moving from lying on your back to sitting on the side of a flat bed without using bedrails?: Total Help needed moving to and from a bed to a chair (including a wheelchair)?: Total Help needed standing up from a chair using your arms (e.g., wheelchair or bedside chair)?: Total Help needed to walk in hospital room?: Total Help needed climbing 3-5 steps with a railing? : Total 6 Click Score: 6    End of Session Equipment Utilized During Treatment: Oxygen Activity Tolerance: Patient limited by lethargy;Patient limited by pain Patient left: in bed;with call bell/phone within  reach;with bed alarm set;with family/visitor present;with nursing/sitter in room Nurse Communication: Mobility status;Need for lift equipment PT Visit Diagnosis: Unsteadiness on feet (R26.81);Difficulty in walking, not elsewhere classified (R26.2)    Time: 0813-8871 PT Time Calculation (min) (ACUTE ONLY): 28 min   Charges:   PT Evaluation $PT Eval Low Complexity: 1 Low PT Treatments $Therapeutic Activity: 8-22 mins        Tresa Endo PT Acute Rehabilitation Services Pager [phone] Office [phone]   Claretha Cooper 05/04/2021, 9:01 AM

## 2021-05-04 NOTE — Progress Notes (Addendum)
Progress Note  Patient Name: [name] Date of Encounter: 05/04/2021  [facility] Cardiologist: Jenne Campus, MD   Subjective   No acute overnight events. Patient very drowsy this  morning but able to answer question. No recurrent syncope. No palpitations. She has some chest pain but only when she coughs. No significant shortness of breath. No recurrent ventricular arrhythmias or ectopy on telemetry.  Inpatient Medications    Scheduled Meds:  [START ON 05/12/2021] amiodarone  200 mg Oral Daily   amiodarone  400 mg Oral BID   aspirin EC  81 mg Oral Daily   Chlorhexidine Gluconate Cloth  6 each Topical Daily   enoxaparin (LOVENOX) injection  30 mg Subcutaneous Q24H   fluticasone furoate-vilanterol  1 puff Inhalation Daily   irbesartan  150 mg Oral Daily   mouth rinse  15 mL Mouth Rinse BID   pantoprazole (PROTONIX) IV  40 mg Intravenous Q12H   senna-docusate  2 tablet Oral BID   sodium chloride flush  3 mL Intravenous Q12H   Continuous Infusions:  cefTRIAXone (ROCEPHIN)  IV Stopped (05/03/21 1915)   lactated ringers Stopped (05/03/21 1830)   PRN Meds: acetaminophen **OR** acetaminophen, albuterol, hydrALAZINE, HYDROmorphone (DILAUDID) injection, labetalol, LORazepam, oxyCODONE, polyethylene glycol   Vital Signs    Vitals:   05/04/21 0600 05/04/21 0700 05/04/21 0800 05/04/21 0900  BP: (!) 149/54 (!) 160/56 (!) 164/64 (!) 151/54  Pulse: 66 69 71 75  Resp: (!) 26 19 (!) 29 17  Temp:    (!) 97.5 F (36.4 C)  TempSrc:    Axillary  SpO2: 97% 91% 96% 94%  Weight:      Height:        Intake/Output Summary (Last 24 hours) at 05/04/2021 1002 Last data filed at 05/04/2021 0555 Gross per 24 hour  Intake 614.7 ml  Output 500 ml  Net 114.7 ml   Last 3 Weights 05/04/2021 05/03/2021 05/01/2021  Weight (lbs) 149 lb 0.5 oz 149 lb 11.1 oz 140 lb 10.5 oz  Weight (kg) 67.6 kg 67.9 kg 63.8 kg      Telemetry    Normal sinus rhythm with rates in the 60s to 70s. No  ventricular arrhyhtmia or ectopy. - Personally Reviewed  ECG    No new ECG tracing today. - Personally Reviewed  Physical Exam   GEN: Elderly Caucasian female. No acute distress.   Neck: No JVD. Cardiac: RRR. No murmurs, rubs, or gallops.  Respiratory: Clear to auscultation bilaterally. No wheezes, rhonchi, or rales. GI: Soft, non-tender, non-distended. MS: No lower extremity edema. No deformity. Skin: Warm and dry. Neuro:  No focal deficits. Psych: Normal affect. Responds appropriately.  Labs    High Sensitivity Troponin:   Recent Labs  Lab 04/17/21 1813 04/17/21 2002 04/30/21 1507 04/30/21 2135  TROPONINIHS 11 11 22* 29*     Chemistry Recent Labs  Lab 04/30/21 1507 04/30/21 1507 05/01/21 0520 05/01/21 1532 05/01/21 2001 05/02/21 0246 05/03/21 0252 05/04/21 0250 05/04/21 0604  NA 137  --  139 137  --  138  --   --  135  K 4.2  --  2.4* 5.6* 4.3 4.0  --   --  3.6  CL 99  --  97* 106  --  102  --   --  99  CO2 21*  --  24 18*  --  26  --   --  27  GLUCOSE 153*  --  166* 172*  --  152*  --   --  124*  BUN 35*  --  36* 35*  --  40*  --   --  37*  CREATININE 0.94  --  1.04* 1.01*  --  1.34*  --   --  1.17*  CALCIUM 8.7*  --  8.4* 8.4*  --  8.3*  --   --  8.0*  MG  --    < > 2.0 2.1  --   --  1.9 2.0  --   PROT 7.3  --  6.0*  --   --   --   --   --  5.1*  ALBUMIN 3.2*  --  2.8* 3.0*  --   --   --   --  2.4*  AST 30  --  45*  --   --   --   --   --  17  ALT 15  --  35  --   --   --   --   --  19  ALKPHOS 202*  --  148*  --   --   --   --   --  97  BILITOT 2.8*  --  1.2  --   --   --   --   --  0.9  GFRNONAA 60*  --  53* 55*  --  39*  --   --  46*  ANIONGAP 17*  --  18* 13  --  10  --   --  9   < > = values in this interval not displayed.    Lipids No results for input(s): CHOL, TRIG, HDL, LABVLDL, LDLCALC, CHOLHDL in the last 168 hours.  Hematology Recent Labs  Lab 05/01/21 0520 05/01/21 1532 05/04/21 0604  WBC 9.4 9.6 11.3*  RBC 2.56* 3.06* 2.72*  HGB  8.3* 10.0* 8.7*  HCT 25.3* 31.6* 27.7*  MCV 98.8 103.3* 101.8*  MCH 32.4 32.7 32.0  MCHC 32.8 31.6 31.4  RDW 14.2 14.5 14.8  PLT 464* 456* 425*   Thyroid  Recent Labs  Lab 05/04/21 0250  TSH 2.201    BNPNo results for input(s): BNP, PROBNP in the last 168 hours.  DDimer No results for input(s): DDIMER in the last 168 hours.   Radiology    DG CHEST PORT 1 VIEW  Result Date: 05/03/2021 CLINICAL DATA:  Chest pain EXAM: PORTABLE CHEST 1 VIEW COMPARISON:  Previous studies including the examination of 04/30/2021 FINDINGS: Transverse diameter of heart is increased. There are no signs of pulmonary edema. There is improvement in aeration of left lower lung fields. No new focal pulmonary consolidation is seen. Small linear density in the right parahilar region may suggest minimal subsegmental atelectasis. There is blunting of left lateral CP angle. There is no pneumothorax. Left hemidiaphragm is elevated. IMPRESSION: There is interval improvement in aeration of left lower lung fields suggesting resolution of atelectasis/pneumonia. Small left pleural effusion. Minimal subsegmental atelectasis is seen in the right parahilar region. Electronically Signed   By: Elmer Picker M.D.   On: 05/03/2021 10:34    Cardiac Studies   Echocardiogram 05/01/2021: Impressions:  1. Left ventricular ejection fraction, by estimation, is 60 to 65%. The  left ventricle has normal function. The left ventricle has no regional  wall motion abnormalities. Left ventricular diastolic parameters are  consistent with Grade I diastolic  dysfunction (impaired relaxation).   2. Right ventricular systolic function is normal. The right ventricular  size is normal.   3. The mitral valve is grossly normal. Mild mitral  valve regurgitation.   4. The aortic valve is tricuspid. There is mild calcification of the  aortic valve. There is mild thickening of the aortic valve. Aortic valve  regurgitation is not visualized. Aortic  valve sclerosis/calcification is  present, without any evidence of  aortic stenosis.   5. Aortic dilatation noted. There is mild dilatation of the ascending  aorta, measuring 37 mm.   6. The inferior vena cava is normal in size with greater than 50%  respiratory variability, suggesting right atrial pressure of 3 mmHg.   Comparison(s): Compared to prior TTE in 10/2020, there is no significant  change.  Patient Profile     87 y.o. female with a history of chronic diastolic CHF with chronic venous insufficiency, chronic hypoxic respiratory failure on home O2, hypertension, pre-diabetes, GERD, iron deficiency anemia, and memory loss who was admitted on 04/30/2021 for syncope following a coughing spell. Patient had an episode in the ED where a CODE BLUE was called after patient became pale and slumped over. RN briefly started compression but no evidence of VT/Vfib or systole on the monitor. Per ED provider, it does not appear patient ever fully lost consciousness with this. Review of telemetry from this time concerning for [place]. Patient later was noted to have atrial fibrillation with RVR. Cardiology consulted for assistance.  Assessment & Plan    Syncope [place] Mostly likely due to Torsades in the setting of severe hypokalemia. Also may have had a component of cough syncope during first episode. Orthostatics were negative in the ED. Echo this admission showed LVEF of 60-65% with no significant valvular disease. Started on Amiodarone with improvement in ectopy. - No recurrent syncope. No recurrent ventricular arrhyhtmia or ectopy. - Continue Amiodarone 400mg  twice daily. Plan is to transition to 200mg  daily on 05/12/2021. - No plans for LifeVest or ICD at this time given reversible cause of Torsades (hypokalemia). - Will recheck EKG today to monitor QTc.  Paroxysmal Atrial Fibrillation Had a brief episode of atrial fibrillation with RVR following Torsades episode. No  recurrence. - Continue Amiodarone at above. - No plans for anticoagulation given very brief episode felt to be secondary to ventricular fibrillation conversion.  Chronic Diastolic CHF Stable this admission with no signs of volume overload. Echo this admission as above. Net positive 3.4 this admission.  - Euvolemic on exam. - Was on Valsartan-HCTZ at home. On Irbesartan here as Valsartan is not on formulary. HCTZ has been stopped due to severe hypokalemia on admission. Will increase Irbesartan to 300mg  daily for additional BP control.  - Continue to monitor volume status closely.  Hypertension BP elevated with systolic BP in the 096E this morning. - Will increase Irbesartan to 300mg  daily. She has already received 150mg  daily today so will give another 150mg  today and then start 30mg  daily tomorrow.  Hypokalemia Potassium 2.4 on admission. Supplemented and 3.6 today. - Will give another Kcl 40 mEq today given potassium has been downtrending the last few day. - Repeat BMET tomorrow.  Otherwise, per primary team: - Chronic hypoxic respiratory failure on home O2 at night - Anemia - Dysphagia/GERD - E. Coli UTI  For questions or updates, please contact [facility] Please consult [email or web address] for contact info under        Signed, Darreld [name], PA-C  05/04/2021, 10:02 AM     Personally seen and examined. Agree with APP above with the following comments: Briefly 87 yo F with history of HTN and COPD  who presents  after hypokalemia and TdP Patient has had improved pain control, still have elevated BP (resting comfortable presently Exam notable for soft systolic murmur, otherwise benign. Labs notable for persistently low K Personally reviewed relevant tests; one PVC on telemetry Would recommend  - amiodarone load then 200 mg PO daily until follow up, I suspect this could be DC/ed at this time - repeat ECG QTC < 500 -K supplementation today and Increase ARB, no plans to  restart HCTZ, though her K has never been THAT low (was 3.4 in 2018) as it was prior to arrest - peri-discharge will arrange outpatient heart monitor   [facility] will sign off.   Medication Recommendations:  Amiodarone and Irebsartan (can be changed to valsartan as outpatient but without combo HCTZ) Other recommendations (labs, testing, etc):  Outpatient TSH, LFTs, and PFTs on amiodarone Outpatient heart monitor Follow up as an outpatient:   - will arrange outpatient f/u with primary cardiologist (Dr. Joycelyn Rua)  - at that time, patient me be appropriate for DC of amiodarone  Rudean Haskell, MD Cardiologist [facility]  [address] [place], [place] [address] [phone]  11:38 AM

## 2021-05-05 DIAGNOSIS — R55 Syncope and collapse: Secondary | ICD-10-CM | POA: Diagnosis not present

## 2021-05-05 LAB — CBC
HCT: 27.6 % — ABNORMAL LOW (ref 36.0–46.0)
Hemoglobin: 8.9 g/dL — ABNORMAL LOW (ref 12.0–15.0)
MCH: 32.6 pg (ref 26.0–34.0)
MCHC: 32.2 g/dL (ref 30.0–36.0)
MCV: 101.1 fL — ABNORMAL HIGH (ref 80.0–100.0)
Platelets: 454 10*3/uL — ABNORMAL HIGH (ref 150–400)
RBC: 2.73 MIL/uL — ABNORMAL LOW (ref 3.87–5.11)
RDW: 14.6 % (ref 11.5–15.5)
WBC: 9 10*3/uL (ref 4.0–10.5)
nRBC: 0 % (ref 0.0–0.2)

## 2021-05-05 LAB — BASIC METABOLIC PANEL
Anion gap: 7 (ref 5–15)
BUN: 33 mg/dL — ABNORMAL HIGH (ref 8–23)
CO2: 28 mmol/L (ref 22–32)
Calcium: 8.2 mg/dL — ABNORMAL LOW (ref 8.9–10.3)
Chloride: 97 mmol/L — ABNORMAL LOW (ref 98–111)
Creatinine, Ser: 0.94 mg/dL (ref 0.44–1.00)
GFR, Estimated: 60 mL/min — ABNORMAL LOW (ref >60)
Glucose, Bld: 134 mg/dL — ABNORMAL HIGH (ref 70–99)
Potassium: 3.9 mmol/L (ref 3.5–5.1)
Sodium: 132 mmol/L — ABNORMAL LOW (ref 135–145)

## 2021-05-05 LAB — PHOSPHORUS: Phosphorus: 2.8 mg/dL (ref 2.5–4.6)

## 2021-05-05 LAB — GLUCOSE, CAPILLARY: Glucose-Capillary: 127 mg/dL — ABNORMAL HIGH (ref 70–99)

## 2021-05-05 LAB — MAGNESIUM: Magnesium: 2 mg/dL (ref 1.7–2.4)

## 2021-05-05 MED ORDER — ENSURE ENLIVE PO LIQD
237.0000 mL | Freq: Two times a day (BID) | ORAL | Status: DC
  Administered 2021-05-05 (×2): 237 mL via ORAL

## 2021-05-05 NOTE — Progress Notes (Signed)
Palliative Care consultation order received.  Reviewed case in detail including events at the time of admission related to chest compressions. Goals appear to be established in terms of treatment goals and plan of care and also regarding code status.   Recommendations: Out-of-Hospital DNR at discharge and completion of a MOST form in the near future.  Recommend referral for outpatient palliative care services to follow in the post-acute setting. She would benefit from this additional layer of support in rehabilitation and at home.   Will defer inpatient consult for now. Please re-consult if her condition deteriorates or her goals of care change.  Lane Hacker, DO Palliative Medicine

## 2021-05-05 NOTE — Progress Notes (Signed)
Occupational Therapy Treatment Patient Details Name: [name] MRN: [id number] DOB: 04-24-1934 Today's Date: 05/05/2021   History of present illness 87 year old F with PMH of hiatal hernia status postrepair, dysphagia, GERD, CHF, PE not on AC, chronic hypoxic RF on 2 L at bedtime, cognitive impairment and venous insufficiency brought to ED by EMS after an episode of 3-second LOC after coughing spell followed by generalized weakness. Admitted for syncope and collapse. While in ED, CODE BLUE called when patient became pale and slumped over but no LOC.  She had brief CPR.  No VT, VF or asystole on monitor.  She later developed A. fib with RVR and had 20 beats of nonsustained VT.   OT comments  Patient was noted to have continued increased pain with all movements on this date with nursing made aware. Patient was noted to have increased dizziness with sitting in chair positioning in bed to take drinks of apple juice per patient request. Patient was quick to fatigue with soft voice during session. Orthostatics limiting participation in session on this date.  Blood pressures: Supine 182/76 mmhg Sitting in chair position: 158/70 mmhg   Recommendations for follow up therapy are one component of a multi-disciplinary discharge planning process, led by the attending physician.  Recommendations may be updated based on patient status, additional functional criteria and insurance authorization.    Follow Up Recommendations  Skilled nursing-short term rehab (<3 hours/day)    Assistance Recommended at Discharge Frequent or constant Supervision/Assistance  Patient can return home with the following  A lot of help with bathing/dressing/bathroom;Assistance with cooking/housework;Two people to help with walking and/or transfers   Equipment Recommendations  Other (comment) (defer to next venue)    Recommendations for Other Services      Precautions / Restrictions Precautions Precautions: Fall Precaution  Comments: sternal pain Restrictions Weight Bearing Restrictions: No       Mobility Bed Mobility                    Transfers                         Balance                                           ADL either performed or assessed with clinical judgement   ADL Overall ADL's : Needs assistance/impaired Eating/Feeding: Set up;Bed level Eating/Feeding Details (indicate cue type and reason): to take sips of apple juice with encouragement to participate in holding container and educated husband on the same. Grooming: Set up;Wash/dry face;Bed level                                 General ADL Comments: patient was noted to have increased coughing during session. patient's blood pressure in supine was 182/76 mmhg, sitting chair position in bed was 158/70 mmhg with dizziness noted. nurse made aware.    Extremity/Trunk Assessment              Vision       Perception     Praxis      Cognition Arousal/Alertness: Lethargic Behavior During Therapy: Flat affect Overall Cognitive Status: Within Functional Limits for tasks assessed  General Comments: patients husband was in place        Exercises      Shoulder Instructions       General Comments      Pertinent Vitals/ Pain       Pain Assessment Pain Assessment: Faces Faces Pain Scale: Hurts even more Pain Location: sternal pain - with movement and cough Pain Descriptors / Indicators: Stabbing, Sharp, Grimacing, Moaning Pain Intervention(s): Monitored during session, Patient requesting pain meds-RN notified, Limited activity within patient's tolerance, Repositioned  Home Living                                          Prior Functioning/Environment              Frequency  Min 2X/week        Progress Toward Goals  OT Goals(current goals can now be found in the care plan section)  Progress  towards OT goals: OT to reassess next treatment     Plan Discharge plan remains appropriate    Co-evaluation                 AM-PAC OT "6 Clicks" Daily Activity     Outcome Measure   Help from another person eating meals?: A Little Help from another person taking care of personal grooming?: A Little Help from another person toileting, which includes using toliet, bedpan, or urinal?: Total Help from another person bathing (including washing, rinsing, drying)?: A Lot Help from another person to put on and taking off regular upper body clothing?: A Lot Help from another person to put on and taking off regular lower body clothing?: Total 6 Click Score: 12    End of Session    OT Visit Diagnosis: Muscle weakness (generalized) (M62.81);Pain   Activity Tolerance Patient limited by fatigue;Patient limited by pain   Patient Left in bed;with call bell/phone within reach;with bed alarm set;with family/visitor present   Nurse Communication Mobility status        Time: 0165-5374 OT Time Calculation (min): 31 min  Charges: OT General Charges $OT Visit: 1 Visit OT Treatments $Self Care/Home Management : 23-37 mins  Jackelyn Poling OTR/L, MS Acute Rehabilitation Department Office# [phone] Pager# [phone]   Marcellina Millin 05/05/2021, 9:56 AM

## 2021-05-05 NOTE — Care Management Important Message (Signed)
Important Message  Patient Details IM Letter placed in Patients room. Name: [name] MRN: [id number] Date of Birth: June 22, 1934   Medicare Important Message Given:  Yes     Kerin Salen 05/05/2021, 11:54 AM

## 2021-05-05 NOTE — Progress Notes (Signed)
PROGRESS NOTE  [name] MWU:[id number] DOB: [birth year] DOA: 04/30/2021 PCP: Aretta Nip, MD  HPI/Recap of past 45 hours: 87 year old F with PMH of hiatal hernia status postrepair, dysphagia, GERD, CHF, PE not on AC, chronic hypoxic RF on 2 L O2 [place] at bedtime, cognitive impairment and venous insufficiency brought to ED by EMS after an episode of 3-second LOC after coughing spell followed by generalized weakness.    While in ED, CODE BLUE called when patient became pale and slumped over but no LOC.  She had brief CPR.  No VT, VF or asystole on monitor.  EKG A. fib with RVR to 130s and prolonged QT to 540s but wide QRS.  She later developed A. fib with RVR and had 20 beats of nonsustained VT. Cardiology consulted.  Per cardiology syncope is likely secondary to torsade the point in the setting of severe hypokalemia.  She received amiodarone drip which was switched to oral amiodarone on 05/03/2021.  Transferred out of stepdown unit on 05/04/2021.  05/05/2021: Seen and examined at bedside.  Husband was present in the room.  Reports pain in her chest only when she coughs.    Assessment/Plan: Principal Problem:   Syncope and collapse Active Problems:   Hypertension   GERD (gastroesophageal reflux disease)   Chronic respiratory failure (HCC)   Iron deficiency anemia   Gastro-esophageal reflux disease without esophagitis   S/P repair of paraesophageal hernia   Acute lower UTI   Diastolic dysfunction   Atrial fibrillation with RVR (HCC)   Non-sustained ventricular tachycardia   Pressure injury of skin  Syncope secondary to torsade the point in the setting of severe hypokalemia. Orthostatic vital signs negative in the ED. Seen by cardiology, no plans for LifeVest or ICD at this time given reversible causes of torsade the point which was hypokalemia. Continue to avoid QTC prolonging agents. Optimize magnesium and potassium levels. Potassium 3.9, magnesium 2.0 on  05/05/2021.  Paroxysmal A. fib Continue amiodarone as recommended by cardiology Per cardiology no plans for anticoagulation given very brief episodes felt to be secondary to ventricular fibrillation conversion.  Resolved AKI on CKD 2 suspect prerenal in setting of poor oral intake from dehydration Baseline creatinine appears to be 0.9 with GFR greater than 60 She is back to her baseline creatinine 0.9 with GFR of 60. Continue to monitor urine output Continue to avoid nephrotoxic agents, dehydration and hypotension. Repeat BMP in the morning   Dysphagia/GERD/nausea/history of hiatal hernia status postrepair:  Concern about possible stricture. Followed by [name] outpatient with plan for barium swallow -Follow SLP eval, recommended clear liquid diet. -Change PPI to IV [facility] consulted on 05/04/2021.   E. coli UTI Urine culture positive for greater than 100,000 colonies of E. coli, pansensitive She is asymptomatic Complete 3 days of Rocephin   Chronic diastolic CHF: TTE in 10/2534 with LVEF of 60 to 65%, G1 DD and normal RVSP.  Not on diuretics at home.  Euvolemic on exam. Strict I's and O's and daily weight   Resolved uncontrolled hypertension, BP is now at goal:  BP is currently at goal. Continue Avapro to 150 mg daily and amiodarone as recommended by cardiology -Continue IV hydralazine 10 mg every 6 hours per cardiology  Resolved mild leukocytosis WBC 11.3>> 9.0. No evidence of active infective process at this time.  Acute drop in hemoglobin Hemoglobin uptrending 8.9 from 8.7  Monitor H&H No overt bleeding.   Chronic COPD/chronic hypoxic RF:  Wears 2 L at night.  Wean off oxygen supplementation as tolerated -Continue breathing treatments and supplemental oxygen  Normocytic anemia: Drop in Hgb likely dilutional.  Patient and daughter denies melena or hematochezia. -Continue monitoring H&H. -Check anemia panel in the morning    Resolved elevated anion gap:     Elevated troponin: Likely demand ischemia from #1.  No acute ischemic finding on EKG. 2D echo completed, and reviewed. Troponin peaked at 29. Seen by cardiology.  Resolved post repletion: Severe hypokalemia:  K2.4 now 4.0.  Mg 2.0.  Patient was on HCTZ at home -Discontinue HCTZ   GERD - Continue home PPI   Generalized weakness/physical deconditioning PT OT to evaluate Fall precautions.  Goal of care counseling: Discussed CODE STATUS with patient and patient's daughter at bedside.  Patient is not interested in CPR, DCCV or intubation but okay with ACLS meds and NIPPV. -Updated CODE STATUS in the computer.   Body mass index is 26.52 kg/m. DVT prophylaxis:  enoxaparin (LOVENOX) injection 40 mg Start: 05/01/21 1000   Code Status: Partial-no CPR, DCCV or intubation but ACLS meds and NIPPV Family Communication: None at bedside.  Level of care: Stepdown Status is: Inpatient   Remains inpatient appropriate because: Due to syncope/arrhythmia requiring IV amiodarone and further evaluation     Final disposition: TBD       Consultants:  Cardiology     Objective: Vitals:   05/05/21 0419 05/05/21 0500 05/05/21 0949 05/05/21 1329  BP: (!) 174/68  (!) 130/54 (!) 144/68  Pulse: 64  67 61  Resp: 18   16  Temp: 97.9 F (36.6 C)  98.9 F (37.2 C) 97.6 F (36.4 C)  TempSrc: Oral  Axillary   SpO2: 94%  96% 96%  Weight:  68.3 kg    Height:        Intake/Output Summary (Last 24 hours) at 05/05/2021 1449 Last data filed at 05/05/2021 0500 Gross per 24 hour  Intake 180 ml  Output 600 ml  Net -420 ml   Filed Weights   05/03/21 0500 05/04/21 0543 05/05/21 0500  Weight: 67.9 kg 67.6 kg 68.3 kg    Exam:  General: 87 y.o. year-old female frail-appearing no acute distress.  She is alert and oriented x3.   Cardiovascular: Regular rate and rhythm no rubs or gallops.   Respiratory: Clear to auscultation no wheezes or rales. Musculoskeletal: Trace lower extremity edema  bilaterally.   Skin: No ulcerative lesions noted. Psychiatry: Mood is appropriate for condition and setting.  Data Reviewed: CBC: Recent Labs  Lab 04/30/21 1507 05/01/21 0520 05/01/21 1532 05/04/21 0604 05/05/21 0750  WBC 6.5 9.4 9.6 11.3* 9.0  NEUTROABS 5.5  --   --  9.3*  --   HGB 9.8* 8.3* 10.0* 8.7* 8.9*  HCT 29.5* 25.3* 31.6* 27.7* 27.6*  MCV 98.0 98.8 103.3* 101.8* 101.1*  PLT 443* 464* 456* 425* 599*   Basic Metabolic Panel: Recent Labs  Lab 05/01/21 0520 05/01/21 1532 05/01/21 2001 05/02/21 0246 05/03/21 0252 05/04/21 0250 05/04/21 0604 05/04/21 1104 05/05/21 0433 05/05/21 0750  NA 139 137  --  138  --   --  135 134*  --  132*  K 2.4* 5.6* 4.3 4.0  --   --  3.6 3.9  --  3.9  CL 97* 106  --  102  --   --  99 97*  --  97*  CO2 24 18*  --  26  --   --  27 29  --  28  GLUCOSE 166* 172*  --  152*  --   --  124* 157*  --  134*  BUN 36* 35*  --  40*  --   --  37* 39*  --  33*  CREATININE 1.04* 1.01*  --  1.34*  --   --  1.17* 1.22*  --  0.94  CALCIUM 8.4* 8.4*  --  8.3*  --   --  8.0* 8.0*  --  8.2*  MG 2.0 2.1  --   --  1.9 2.0  --   --  2.0  --   PHOS  --  2.9  --   --   --   --  3.1  --   --  2.8   GFR: Estimated Creatinine Clearance: 39.4 mL/min (by C-G formula based on SCr of 0.94 mg/dL). Liver Function Tests: Recent Labs  Lab 04/30/21 1507 05/01/21 0520 05/01/21 1532 05/04/21 0604  AST 30 45*  --  17  ALT 15 35  --  19  ALKPHOS 202* 148*  --  97  BILITOT 2.8* 1.2  --  0.9  PROT 7.3 6.0*  --  5.1*  ALBUMIN 3.2* 2.8* 3.0* 2.4*   Recent Labs  Lab 04/30/21 1507  LIPASE 28   No results for input(s): AMMONIA in the last 168 hours. Coagulation Profile: No results for input(s): INR, PROTIME in the last 168 hours. Cardiac Enzymes: No results for input(s): CKTOTAL, CKMB, CKMBINDEX, TROPONINI in the last 168 hours. BNP (last 3 results) Recent Labs    07/08/20 1116 02/03/21 1510  PROBNP 350.0* 647   HbA1C: No results for input(s): HGBA1C in the  last 72 hours. CBG: Recent Labs  Lab 04/30/21 2136 05/01/21 0518 05/02/21 0547 05/04/21 0448 05/05/21 0613  GLUCAP 157* 149* 150* 127* 127*   Lipid Profile: No results for input(s): CHOL, HDL, LDLCALC, TRIG, CHOLHDL, LDLDIRECT in the last 72 hours. Thyroid Function Tests: Recent Labs    05/04/21 0250  TSH 2.201   Anemia Panel: No results for input(s): VITAMINB12, FOLATE, FERRITIN, TIBC, IRON, RETICCTPCT in the last 72 hours. Urine analysis:    Component Value Date/Time   COLORURINE YELLOW 04/30/2021 1520   APPEARANCEUR HAZY (A) 04/30/2021 1520   LABSPEC 1.015 04/30/2021 1520   PHURINE 5.0 04/30/2021 1520   GLUCOSEU NEGATIVE 04/30/2021 1520   HGBUR LARGE (A) 04/30/2021 1520   BILIRUBINUR NEGATIVE 04/30/2021 1520   KETONESUR 20 (A) 04/30/2021 1520   PROTEINUR >=300 (A) 04/30/2021 1520   UROBILINOGEN 0.2 06/28/2011 1358   NITRITE NEGATIVE 04/30/2021 1520   LEUKOCYTESUR NEGATIVE 04/30/2021 1520   Sepsis Labs: @LABRCNTIP (procalcitonin:4,lacticidven:4)  ) Recent Results (from the past 240 hour(s))  Urine Culture     Status: Abnormal   Collection Time: 04/30/21  3:20 PM   Specimen: Urine, Clean Catch  Result Value Ref Range Status   Specimen Description   Final    URINE, CLEAN CATCH Performed at [facility], [place] [address], [place], [place] [address]    Special Requests   Final    NONE Performed at [facility], [place] [address], [place], [place] [address]    Culture >=100,000 COLONIES/mL ESCHERICHIA COLI (A)  Final   Report Status 05/02/2021 FINAL  Final   Organism ID, Bacteria ESCHERICHIA COLI (A)  Final      Susceptibility   Escherichia coli - MIC*    AMPICILLIN 4 SENSITIVE Sensitive     CEFAZOLIN <=4 SENSITIVE Sensitive     CEFEPIME <=0.12 SENSITIVE Sensitive  CEFTRIAXONE <=0.25 SENSITIVE Sensitive     CIPROFLOXACIN <=0.25 SENSITIVE Sensitive     GENTAMICIN <=1 SENSITIVE Sensitive     IMIPENEM <=0.25 SENSITIVE  Sensitive     NITROFURANTOIN <=16 SENSITIVE Sensitive     TRIMETH/SULFA <=20 SENSITIVE Sensitive     AMPICILLIN/SULBACTAM <=2 SENSITIVE Sensitive     PIP/TAZO <=4 SENSITIVE Sensitive     * >=100,000 COLONIES/mL ESCHERICHIA COLI  Resp Panel by RT-PCR (Flu A&B, Covid) Nasopharyngeal Swab     Status: None   Collection Time: 04/30/21  3:40 PM   Specimen: Nasopharyngeal Swab; Nasopharyngeal(NP) swabs in vial transport medium  Result Value Ref Range Status   SARS Coronavirus 2 by RT PCR NEGATIVE NEGATIVE Final    Comment: (NOTE) SARS-CoV-2 target nucleic acids are NOT DETECTED.  The SARS-CoV-2 RNA is generally detectable in upper respiratory specimens during the acute phase of infection. The lowest concentration of SARS-CoV-2 viral copies this assay can detect is 138 copies/mL. A negative result does not preclude SARS-Cov-2 infection and should not be used as the sole basis for treatment or other patient management decisions. A negative result may occur with  improper specimen collection/handling, submission of specimen other than nasopharyngeal swab, presence of viral mutation(s) within the areas targeted by this assay, and inadequate number of viral copies(<138 copies/mL). A negative result must be combined with clinical observations, patient history, and epidemiological information. The expected result is Negative.  Fact Sheet for Patients:  EntrepreneurPulse.com.au  Fact Sheet for Healthcare Providers:  IncredibleEmployment.be  This test is no t yet approved or cleared by the [place] FDA and  has been authorized for detection and/or diagnosis of SARS-CoV-2 by FDA under an Emergency Use Authorization (EUA). This EUA will remain  in effect (meaning this test can be used) for the duration of the COVID-19 declaration under Section 564(b)(1) of the Act, 21 U.S.C.section 360bbb-3(b)(1), unless the authorization is terminated  or revoked sooner.        Influenza A by PCR NEGATIVE NEGATIVE Final   Influenza B by PCR NEGATIVE NEGATIVE Final    Comment: (NOTE) The Xpert Xpress SARS-CoV-2/FLU/RSV plus assay is intended as an aid in the diagnosis of influenza from Nasopharyngeal swab specimens and should not be used as a sole basis for treatment. Nasal washings and aspirates are unacceptable for Xpert Xpress SARS-CoV-2/FLU/RSV testing.  Fact Sheet for Patients: EntrepreneurPulse.com.au  Fact Sheet for Healthcare Providers: IncredibleEmployment.be  This test is not yet approved or cleared by the [place] FDA and has been authorized for detection and/or diagnosis of SARS-CoV-2 by FDA under an Emergency Use Authorization (EUA). This EUA will remain in effect (meaning this test can be used) for the duration of the COVID-19 declaration under Section 564(b)(1) of the Act, 21 U.S.C. section 360bbb-3(b)(1), unless the authorization is terminated or revoked.  Performed at [facility], [place] [address] [place], [place] [address]   MRSA Next Gen by PCR, Nasal     Status: None   Collection Time: 05/01/21  2:44 PM   Specimen: Nasal Mucosa; Nasal Swab  Result Value Ref Range Status   MRSA by PCR Next Gen NOT DETECTED NOT DETECTED Final    Comment: (NOTE) The GeneXpert MRSA Assay (FDA approved for NASAL specimens only), is one component of a comprehensive MRSA colonization surveillance program. It is not intended to diagnose MRSA infection nor to guide or monitor treatment for MRSA infections. Test performance is not FDA approved in patients less than 12 years old. Performed at [facility]  [place] [address], [place], [place] [address]       Studies: DG ESOPHAGUS W SINGLE CM (SOL OR THIN BA)  Result Date: 05/04/2021 CLINICAL DATA:  Esophageal stricture follow-up. EXAM: ESOPHAGUS/BARIUM SWALLOW/TABLET STUDY TECHNIQUE: Single contrast examination was  performed using thin liquid barium. This exam was performed by Rushie Nyhan NP, and was supervised and interpreted by Fabiola Backer MD. FLUOROSCOPY TIME:  Radiation Exposure Index (as provided by the fluoroscopic device): 10.4 mGy If the device does not provide the exposure index: Fluoroscopy Time:  1 minute, 6 seconds Number of Acquired Images:  0 COMPARISON:  Esophagram dated June 25, 2020. FINDINGS: Swallowing: Appears normal. No vestibular penetration or aspiration seen. Pharynx: Unremarkable. Esophagus: Unchanged short segment smooth circumferential narrowing of the distal esophagus near the GE junction. Esophageal motility: Occasional mild tertiary contractions. Hiatal Hernia: None. Gastroesophageal reflux: None visualized. Ingested 50mm barium tablet: Not given Other: None. IMPRESSION: 1. Unchanged mild peptic stricture in the distal esophagus near the GE junction. 2. Mild esophageal dysmotility. Electronically Signed   By: Titus Dubin M.D.   On: 05/04/2021 16:32    Scheduled Meds:  [START ON 05/12/2021] amiodarone  200 mg Oral Daily   amiodarone  400 mg Oral BID   aspirin EC  81 mg Oral Daily   Chlorhexidine Gluconate Cloth  6 each Topical Daily   enoxaparin (LOVENOX) injection  40 mg Subcutaneous Q24H   feeding supplement  237 mL Oral BID BM   fluticasone furoate-vilanterol  1 puff Inhalation Daily   irbesartan  300 mg Oral Daily   mouth rinse  15 mL Mouth Rinse BID   omeprazole  40 mg Oral BID   senna-docusate  2 tablet Oral BID   sodium chloride flush  3 mL Intravenous Q12H    Continuous Infusions:     LOS: 5 days     Kayleen Memos, MD [facility] Pager [phone]  If 7PM-7AM, please contact night-coverage [email or web address] Password [facility] 05/05/2021, 2:49 PM
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
## 2021-05-05 NOTE — TOC Progression Note (Signed)
Transition of Care [facility]) - Progression Note    Patient Details  Name: [name] MRN: [id number] Date of Birth: 1934/07/04  [facility] CM/SW Contact  Inessa Wardrop, Juliann Pulse, RN Phone Number: 05/05/2021, 3:28 PM  Clinical Narrative: Await call back from dtr Katharine Look for agreement to fax out for SNF.      Expected Discharge Plan: Skilled Nursing Facility Barriers to Discharge: Continued Medical Work up  Expected Discharge Plan and Services Expected Discharge Plan: [place]                                               Social Determinants of Health (SDOH) Interventions    Readmission Risk Interventions No flowsheet data found.

## 2021-05-06 DIAGNOSIS — R55 Syncope and collapse: Secondary | ICD-10-CM | POA: Diagnosis not present

## 2021-05-06 LAB — MAGNESIUM: Magnesium: 1.9 mg/dL (ref 1.7–2.4)

## 2021-05-06 LAB — BASIC METABOLIC PANEL
Anion gap: 5 (ref 5–15)
BUN: 31 mg/dL — ABNORMAL HIGH (ref 8–23)
CO2: 30 mmol/L (ref 22–32)
Calcium: 8.2 mg/dL — ABNORMAL LOW (ref 8.9–10.3)
Chloride: 94 mmol/L — ABNORMAL LOW (ref 98–111)
Creatinine, Ser: 1.06 mg/dL — ABNORMAL HIGH (ref 0.44–1.00)
GFR, Estimated: 52 mL/min — ABNORMAL LOW (ref >60)
Glucose, Bld: 143 mg/dL — ABNORMAL HIGH (ref 70–99)
Potassium: 4.3 mmol/L (ref 3.5–5.1)
Sodium: 129 mmol/L — ABNORMAL LOW (ref 135–145)

## 2021-05-06 LAB — CBC
HCT: 25.5 % — ABNORMAL LOW (ref 36.0–46.0)
Hemoglobin: 8.4 g/dL — ABNORMAL LOW (ref 12.0–15.0)
MCH: 32.7 pg (ref 26.0–34.0)
MCHC: 32.9 g/dL (ref 30.0–36.0)
MCV: 99.2 fL (ref 80.0–100.0)
Platelets: 390 10*3/uL (ref 150–400)
RBC: 2.57 MIL/uL — ABNORMAL LOW (ref 3.87–5.11)
RDW: 14.4 % (ref 11.5–15.5)
WBC: 8.5 10*3/uL (ref 4.0–10.5)
nRBC: 0.2 % (ref 0.0–0.2)

## 2021-05-06 LAB — GLUCOSE, CAPILLARY: Glucose-Capillary: 137 mg/dL — ABNORMAL HIGH (ref 70–99)

## 2021-05-06 LAB — PHOSPHORUS: Phosphorus: 2.7 mg/dL (ref 2.5–4.6)

## 2021-05-06 MED ORDER — DICLOFENAC EPOLAMINE 1.3 % EX PTCH
1.0000 | MEDICATED_PATCH | Freq: Two times a day (BID) | CUTANEOUS | Status: DC
  Administered 2021-05-06 – 2021-05-15 (×19): 1 via TRANSDERMAL
  Filled 2021-05-06 (×20): qty 1

## 2021-05-06 MED ORDER — CHLORHEXIDINE GLUCONATE 0.12 % MT SOLN
15.0000 mL | Freq: Two times a day (BID) | OROMUCOSAL | Status: DC
  Administered 2021-05-06 – 2021-05-16 (×22): 15 mL via OROMUCOSAL
  Filled 2021-05-06 (×17): qty 15

## 2021-05-06 MED ORDER — BOOST / RESOURCE BREEZE PO LIQD CUSTOM
1.0000 | Freq: Three times a day (TID) | ORAL | Status: DC
  Administered 2021-05-06 – 2021-05-10 (×9): 1 via ORAL

## 2021-05-06 MED ORDER — ORAL CARE MOUTH RINSE
15.0000 mL | Freq: Two times a day (BID) | OROMUCOSAL | Status: DC
  Administered 2021-05-06 – 2021-05-13 (×13): 15 mL via OROMUCOSAL

## 2021-05-06 MED ORDER — ENOXAPARIN SODIUM 40 MG/0.4ML IJ SOSY
40.0000 mg | PREFILLED_SYRINGE | INTRAMUSCULAR | Status: DC
  Administered 2021-05-07 – 2021-05-15 (×7): 40 mg via SUBCUTANEOUS
  Filled 2021-05-06 (×8): qty 0.4

## 2021-05-06 NOTE — Progress Notes (Addendum)
Patient ID: VON INSCOE, female   DOB: Jan 05, 1935, 87 y.o.   MRN: [id number]    Progress Note   Subjective   Day # 6  CC; dysphagia  Patient sleeping soundly this morning, family at bedside, including patient's daughter Family reports she has continued to have significant difficulty trying to eat, drinking Ensure Plus yesterday followed by an episode of fecal incontinence and her daughter says that this completely wore her out.  O2 sats 88 last p.m., back on oxygen 2 L  WBC 8.5/hemoglobin 8.4/hematocrit 25.5 Sodium 129/potassium 4.3/retinae 1.06 Magnesium/phosphorus within normal limits    Objective   Vital signs in last 24 hours: Temp:  [97.6 F (36.4 C)-98.9 F (37.2 C)] 98.9 F (37.2 C) (01/18 0555) Pulse Rate:  [59-66] 59 (01/18 0555) Resp:  [16-20] 20 (01/18 0555) BP: (144-182)/(62-82) 153/62 (01/18 0623) SpO2:  [92 %-98 %] 98 % (01/18 0843) Weight:  [67.6 kg] 67.6 kg (01/18 0500) Last BM Date: 05/05/21 General: Elderly white female in NAD Sleeping soundly, did not disturb  Intake/Output from previous day: 01/17 0701 - 01/18 0700 In: 423 [P.O.:420; I.V.:3] Out: 4 [Urine:2; Stool:2] Intake/Output this shift: No intake/output data recorded.  Lab Results: Recent Labs    05/04/21 0604 05/05/21 0750 05/06/21 0429  WBC 11.3* 9.0 8.5  HGB 8.7* 8.9* 8.4*  HCT 27.7* 27.6* 25.5*  PLT 425* 454* 390   BMET Recent Labs    05/04/21 1104 05/05/21 0750 05/06/21 0429  NA 134* 132* 129*  K 3.9 3.9 4.3  CL 97* 97* 94*  CO2 29 28 30   GLUCOSE 157* 134* 143*  BUN 39* 33* 31*  CREATININE 1.22* 0.94 1.06*  CALCIUM 8.0* 8.2* 8.2*   LFT Recent Labs    05/04/21 0604  PROT 5.1*  ALBUMIN 2.4*  AST 17  ALT 19  ALKPHOS 97  BILITOT 0.9   PT/INR No results for input(s): LABPROT, INR in the last 72 hours.  Studies/Results: DG ESOPHAGUS W SINGLE CM (SOL OR THIN BA)  Result Date: 05/04/2021 CLINICAL DATA:  Esophageal stricture follow-up. EXAM: ESOPHAGUS/BARIUM  SWALLOW/TABLET STUDY TECHNIQUE: Single contrast examination was performed using thin liquid barium. This exam was performed by Rushie Nyhan NP, and was supervised and interpreted by Fabiola Backer MD. FLUOROSCOPY TIME:  Radiation Exposure Index (as provided by the fluoroscopic device): 10.4 mGy If the device does not provide the exposure index: Fluoroscopy Time:  1 minute, 6 seconds Number of Acquired Images:  0 COMPARISON:  Esophagram dated June 25, 2020. FINDINGS: Swallowing: Appears normal. No vestibular penetration or aspiration seen. Pharynx: Unremarkable. Esophagus: Unchanged short segment smooth circumferential narrowing of the distal esophagus near the GE junction. Esophageal motility: Occasional mild tertiary contractions. Hiatal Hernia: None. Gastroesophageal reflux: None visualized. Ingested 57mm barium tablet: Not given Other: None. IMPRESSION: 1. Unchanged mild peptic stricture in the distal esophagus near the GE junction. 2. Mild esophageal dysmotility. Electronically Signed   By: Titus Dubin M.D.   On: 05/04/2021 16:32       Assessment / Plan:    #31 87 year old white female with history of paraesophageal hernia and chronic organoaxial volvulus status post laparoscopic repair January 2022. Barium swallow March 2022 no recurrence of hernia, marked GERD and mild smooth circumferential narrowing in the lower thoracic esophagus extending to the GE junction  Symptoms had gradually improved and have now worsened over the past month and a half with ongoing significant dysphagia and inability to tolerate any solid foods  Barium swallow here confirms no recurrent hernia  but persistent distal esophageal stricture  #2 chronic hypoxic respiratory failure secondary to COPD-on home O2 at nighttime Back on oxygen today #3 admission with syncope-torsade de points in ER, requiring brief CPR, no defibrillation also with rapid A. fib converted with amiodarone-remains on amiodarone #4 Marked  hypokalemia on admission possibly precipitating above #5 history of hypertension 6.  Adult onset diabetes mellitus 7.  Anemia chronic  #8 significant debilitation secondary to events over the past week  Plan; continue full liquid diet Will switch supplements to berry breeze resource, hopefully this will not upset her stomach As per secure chat conversation with cardiology yesterday, felt reasonable to consider EGD with dilation of the esophagus, assuring that electrolytes are normal prior to any sedation. Patient is at increased risk of complications with sedation from her multiple comorbidities.  This was discussed with her daughter in detail today. We will reassess tomorrow, continue daily BMET Probable EGD with dilation Friday if remains completely hemodynamically stable     Principal Problem:   Syncope and collapse Active Problems:   Hypertension   GERD (gastroesophageal reflux disease)   Chronic respiratory failure (HCC)   Iron deficiency anemia   Gastro-esophageal reflux disease without esophagitis   S/P repair of paraesophageal hernia   Acute lower UTI   Diastolic dysfunction   Atrial fibrillation with RVR (HCC)   Non-sustained ventricular tachycardia   Pressure injury of skin     LOS: 6 days   Amy Esterwood PA-C 05/06/2021, 10:18 AM    Attending physician's note   I have taken an interval history, reviewed the chart and examined the patient. I agree with the Advanced Practitioner's note, impression and recommendations.   Tolerating full liquid diet.  Plan for EGD with dil 1/19 Recheck BMP prior.  Due to multiple comorbidities, patient is HIGHER THAN BASELINE RISK including risks of cardiopulmonary compromise.The nature of the procedure, as well as the risks, benefits, and alternatives were carefully and thoroughly reviewed with the patient and patient's daughter. Ample time for discussion and questions allowed. The patient understood, was satisfied, and agreed  to proceed.    Carmell Austria, MD [facility] [phone]

## 2021-05-06 NOTE — Progress Notes (Signed)
Physical Therapy Treatment Patient Details Name: [name] MRN: [id number] DOB: 19-Mar-1935 Today's Date: 05/06/2021   History of Present Illness 87 year old F with PMH of hiatal hernia status postrepair, dysphagia, GERD, CHF, PE not on AC, chronic hypoxic RF on 2 L at bedtime, cognitive impairment and venous insufficiency brought to ED by EMS after an episode of 3-second LOC after coughing spell followed by generalized weakness. Admitted for syncope and collapse. While in ED, CODE BLUE called when patient became pale and slumped over but no LOC.  She had brief CPR.  No VT, VF or asystole on monitor.  She later developed A. fib with RVR and had 20 beats of nonsustained VT.    PT Comments    The patient   appears  alittle more alert,  using yaunker. Reports feeling Like " I will throw up." Noted gagging with suctioning of secretions. Patient assisted with total  of  persons to sitting on bed edge and to return to supine. Left in bed chair position. Unable  to assist standing.  Appears to have a little more active movement of UE's with less pain reports. Continue PT as tolerated.   Recommendations for follow up therapy are one component of a multi-disciplinary discharge planning process, led by the attending physician.  Recommendations may be updated based on patient status, additional functional criteria and insurance authorization.  Follow Up Recommendations  Skilled nursing-short term rehab (<3 hours/day)     Assistance Recommended at Discharge Frequent or constant Supervision/Assistance  Patient can return home with the following Two people to help with walking and/or transfers;A lot of help with bathing/dressing/bathroom;Assistance with cooking/housework;Direct supervision/assist for medications management;Assist for transportation;Assistance with feeding;Direct supervision/assist for financial management;Help with stairs or ramp for entrance   Equipment Recommendations  None recommended  by PT    Recommendations for Other Services       Precautions / Restrictions Precautions Precautions: Fall Precaution Comments: sternal pain Restrictions Weight Bearing Restrictions: No     Mobility  Bed Mobility   Bed Mobility: Supine to Sit, Sit to Supine     Supine to sit: Total assist, +2 for physical assistance, +2 for safety/equipment Sit to supine: Total assist, +2 for physical assistance, +2 for safety/equipment   General bed mobility comments: assist with legs and trunk, use of bed pad to move around to bed edge. Total of 2 to return to supine.    Transfers                   General transfer comment: placed feet on STEDY and assisted to reach  forward to grip with right hand on upright. Unable to stand.    Ambulation/Gait                   Stairs             Wheelchair Mobility    Modified Rankin (Stroke Patients Only)       Balance Overall balance assessment: Needs assistance Sitting-balance support: Feet supported, Bilateral upper extremity supported Sitting balance-Leahy Scale: Poor Sitting balance - Comments: froward flexed posture, support  by therapist.  sat on edge x ~ 5 minutes, listing to right Postural control: Right lateral lean                                  Cognition Arousal/Alertness: Lethargic Behavior During Therapy: Flat affect Overall Cognitive Status: Impaired/Different from baseline Area  of Impairment: Orientation                 Orientation Level: Time             General Comments: answers with encouragement        Exercises      General Comments        Pertinent Vitals/Pain Pain Assessment Faces Pain Scale: Hurts even more Pain Location: sternal pain - with movement and cough Pain Descriptors / Indicators: Grimacing, Moaning Pain Intervention(s): Monitored during session, Premedicated before session, Repositioned    Home Living                           Prior Function            PT Goals (current goals can now be found in the care plan section) Progress towards PT goals: Progressing toward goals    Frequency    Min 2X/week      PT Plan Current plan remains appropriate    Co-evaluation PT/OT/SLP Co-Evaluation/Treatment: Yes Reason for Co-Treatment: To address functional/ADL transfers;For patient/therapist safety PT goals addressed during session: Mobility/safety with mobility OT goals addressed during session: ADL's and self-care      AM-PAC PT "6 Clicks" Mobility   Outcome Measure  Help needed turning from your back to your side while in a flat bed without using bedrails?: Total Help needed moving from lying on your back to sitting on the side of a flat bed without using bedrails?: Total Help needed moving to and from a bed to a chair (including a wheelchair)?: Total Help needed standing up from a chair using your arms (e.g., wheelchair or bedside chair)?: Total Help needed to walk in hospital room?: Total Help needed climbing 3-5 steps with a railing? : Total 6 Click Score: 6    End of Session Equipment Utilized During Treatment: Oxygen;Gait belt Activity Tolerance: Patient limited by lethargy;Patient limited by pain;Patient limited by fatigue Patient left: in bed;with call bell/phone within reach;with bed alarm set;with family/visitor present;with nursing/sitter in room Nurse Communication: Mobility status;Need for lift equipment PT Visit Diagnosis: Unsteadiness on feet (R26.81);Difficulty in walking, not elsewhere classified (R26.2)     Time: 1020-1047 PT Time Calculation (min) (ACUTE ONLY): 27 min  Charges:  $Therapeutic Activity: 8-22 mins                     Tresa Endo PT Acute Rehabilitation Services Pager [phone] Office [phone]    Claretha Cooper 05/06/2021, 12:06 PM

## 2021-05-06 NOTE — Progress Notes (Signed)
PROGRESS NOTE   [name]  KNL:[id number] DOB: 1934-09-06 DOA: 04/30/2021 PCP: Aretta Nip, MD  Brief Narrative:   87 year old white female Prior large paraesophageal hernia with organoaxial volvulus status postrepair 04/28/2020 Dr. Kipp Brood Chronic respiratory failure on 3 L oxygen Prior pulmonary embolism no anticoagulation Prior cholecystectomy Previous transaminitis?  2/2 sepsis HTN, HFpEF EF 65% 2016  Admit with 3-second loss of consciousness 04/30/2021-found to be tachycardic-developed CODE BLUE in the ED and patient slumped over found to have NSVT --potassium on admission was noted to be 2.4. Cardiology consulted--patient was started on amiodarone drip-->tablets 1/15 and transferred out of stepdown 1/16  Hospital-Problem based course  V. fib arrest 2/2 hypokalemia status post CPR Paroxysmal A. fib Cardiology input appreciated-no plans LifeVest ICD  Patient currently has bradycardia on monitors continue amiodarone 400 twice daily until 1/24 and then transition to 200 daily Placed Flector patch over chest wall and reassess pain etc. in a.m. discontinue IV Dilaudid use Oxy IR Sparingly every 6 as needed for moderate pain Resolved AKI-peak 3.99 creatinine Continue oral fluids at this time Toxic metabolic encephalopathy on admission Combination of AKI in addition to opiates Discontinue Dilaudid-continue cautious oxycodone for pain control-see below Dysphagia reflux hernia status postrepair 2021 Gastroenterology contemplating EGD-patient cleared by cardiology for procedure  she has been on liquid diet for a week prior to admission which may have accounted for her AKI Possible endoscopy 1/20? HFpEF EF 60-65% Patient euvolemic Avapro 150 daily resumed after AKI resolved Continue hydralazine 10 every 6 as needed Normocytic anemia dilutional Monitor only Urinary tract infection from pta  Received 3 days of ceftriaxone ending 1/15  DVT prophylaxis: Lovenox Code  Status: Full Family Communication:  Disposition: Discussed with patient's daughter and husband at the bedside Status is: Inpatient  Remains inpatient appropriate because: Not stable for discharge     Consultants:  Cardiology Gastroenterology  Procedures:  CPR  Antimicrobials: Rocephin   Subjective: she is sleepy but arousable Likely a mouth breather she will require simple facemask when asleep She complains of chest pain secondary to the CPR Otherwise she is oriented x4 and is able to name time place person  Objective: Vitals:   05/06/21 0500 05/06/21 0555 05/06/21 0623 05/06/21 0843  BP:  (!) 174/68 (!) 153/62   Pulse:  (!) 59    Resp:  20    Temp:  98.9 F (37.2 C)    TempSrc:  Oral    SpO2:  97%  98%  Weight: 67.6 kg     Height:        Intake/Output Summary (Last 24 hours) at 05/06/2021 1354 Last data filed at 05/05/2021 2157 Gross per 24 hour  Intake 423 ml  Output 2 ml  Net 421 ml   Filed Weights   05/04/21 0543 05/05/21 0500 05/06/21 0500  Weight: 67.6 kg 68.3 kg 67.6 kg    Examination:  EOMI NCAT no focal deficit CTA B no added sound rales rhonchi Abdomen soft no rebound no guarding No lower extremity edema ROM intact Neurologically intact Psych euthymic but sleepy   Data Reviewed: personally reviewed   CBC    Component Value Date/Time   WBC 8.5 05/06/2021 0429   RBC 2.57 (L) 05/06/2021 0429   HGB 8.4 (L) 05/06/2021 0429   HGB 12.4 09/27/2016 1349   HCT 25.5 (L) 05/06/2021 0429   HCT 38.4 09/27/2016 1349   PLT 390 05/06/2021 0429   PLT 263 09/27/2016 1349   MCV 99.2 05/06/2021 0429   MCV  89.1 09/27/2016 1349   MCH 32.7 05/06/2021 0429   MCHC 32.9 05/06/2021 0429   RDW 14.4 05/06/2021 0429   RDW 17.3 (H) 09/27/2016 1349   LYMPHSABS 0.9 05/04/2021 0604   LYMPHSABS 1.5 09/27/2016 1349   MONOABS 0.8 05/04/2021 0604   MONOABS 0.7 09/27/2016 1349   EOSABS 0.1 05/04/2021 0604   EOSABS 0.1 09/27/2016 1349   BASOSABS 0.0 05/04/2021  0604   BASOSABS 0.0 09/27/2016 1349   CMP Latest Ref Rng & Units 05/06/2021 05/05/2021 05/04/2021  Glucose 70 - 99 mg/dL 143(H) 134(H) 157(H)  BUN 8 - 23 mg/dL 31(H) 33(H) 39(H)  Creatinine 0.44 - 1.00 mg/dL 1.06(H) 0.94 1.22(H)  Sodium 135 - 145 mmol/L 129(L) 132(L) 134(L)  Potassium 3.5 - 5.1 mmol/L 4.3 3.9 3.9  Chloride 98 - 111 mmol/L 94(L) 97(L) 97(L)  CO2 22 - 32 mmol/L 30 28 29   Calcium 8.9 - 10.3 mg/dL 8.2(L) 8.2(L) 8.0(L)  Total Protein 6.5 - 8.1 g/dL - - -  Total Bilirubin 0.3 - 1.2 mg/dL - - -  Alkaline Phos 38 - 126 U/L - - -  AST 15 - 41 U/L - - -  ALT 0 - 44 U/L - - -     Radiology Studies: DG ESOPHAGUS W SINGLE CM (SOL OR THIN BA)  Result Date: 05/04/2021 CLINICAL DATA:  Esophageal stricture follow-up. EXAM: ESOPHAGUS/BARIUM SWALLOW/TABLET STUDY TECHNIQUE: Single contrast examination was performed using thin liquid barium. This exam was performed by Rushie Nyhan NP, and was supervised and interpreted by Fabiola Backer MD. FLUOROSCOPY TIME:  Radiation Exposure Index (as provided by the fluoroscopic device): 10.4 mGy If the device does not provide the exposure index: Fluoroscopy Time:  1 minute, 6 seconds Number of Acquired Images:  0 COMPARISON:  Esophagram dated June 25, 2020. FINDINGS: Swallowing: Appears normal. No vestibular penetration or aspiration seen. Pharynx: Unremarkable. Esophagus: Unchanged short segment smooth circumferential narrowing of the distal esophagus near the GE junction. Esophageal motility: Occasional mild tertiary contractions. Hiatal Hernia: None. Gastroesophageal reflux: None visualized. Ingested 68mm barium tablet: Not given Other: None. IMPRESSION: 1. Unchanged mild peptic stricture in the distal esophagus near the GE junction. 2. Mild esophageal dysmotility. Electronically Signed   By: Titus Dubin M.D.   On: 05/04/2021 16:32     Scheduled Meds:  [START ON 05/12/2021] amiodarone  200 mg Oral Daily   amiodarone  400 mg Oral BID   aspirin EC   81 mg Oral Daily   chlorhexidine  15 mL Mouth Rinse BID   [START ON 05/07/2021] enoxaparin (LOVENOX) injection  40 mg Subcutaneous Q24H   feeding supplement  1 Container Oral TID BM   feeding supplement  237 mL Oral BID BM   fluticasone furoate-vilanterol  1 puff Inhalation Daily   irbesartan  300 mg Oral Daily   mouth rinse  15 mL Mouth Rinse BID   mouth rinse  15 mL Mouth Rinse q12n4p   omeprazole  40 mg Oral BID   senna-docusate  2 tablet Oral BID   sodium chloride flush  3 mL Intravenous Q12H   Continuous Infusions:   LOS: 6 days   Time spent: [place], MD [facility] To contact the attending provider between 7A-7P or the covering provider during after hours 7P-7A, please log into the web site [email or web address] and access using universal [place] password for that web site. If you do not have the password, please call the hospital operator.  05/06/2021, 1:54 PM

## 2021-05-06 NOTE — Plan of Care (Signed)
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
## 2021-05-06 NOTE — Hospital Course (Addendum)
DVT Neg U sod 16 U OSm 500 ? SIADH

## 2021-05-06 NOTE — Progress Notes (Signed)
Notified on call provider about patient's O2 sats hanging around 88%. Put patient on 2L/min of oxygen and made on call provider aware.

## 2021-05-06 NOTE — Progress Notes (Signed)
Occupational Therapy Treatment Patient Details Name: [name] MRN: [id number] DOB: 08/24/1934 Today's Date: 05/06/2021   History of present illness 87 year old F with PMH of hiatal hernia status postrepair, dysphagia, GERD, CHF, PE not on AC, chronic hypoxic RF on 2 L at bedtime, cognitive impairment and venous insufficiency brought to ED by EMS after an episode of 3-second LOC after coughing spell followed by generalized weakness. Admitted for syncope and collapse. While in ED, CODE BLUE called when patient became pale and slumped over but no LOC.  She had brief CPR.  No VT, VF or asystole on monitor.  She later developed A. fib with RVR and had 20 beats of nonsustained VT.   OT comments  Patient was able to participate in sitting on edge of bed on this date prior to onset of nausea and attempts to produce emesis. Patient was noted to need physical assist to prevent anterior leaning and maintain midline sitting on edge of bed. Family was educated on importance of letting patient participate in holding cup when taking drinks v.s. family holding it for her.  Patient would continue to benefit from skilled OT services at this time while admitted and after d/c to address noted deficits in order to improve overall safety and independence in ADLs.     Recommendations for follow up therapy are one component of a multi-disciplinary discharge planning process, led by the attending physician.  Recommendations may be updated based on patient status, additional functional criteria and insurance authorization.    Follow Up Recommendations  Skilled nursing-short term rehab (<3 hours/day)    Assistance Recommended at Discharge Frequent or constant Supervision/Assistance  Patient can return home with the following  A lot of help with bathing/dressing/bathroom;Assistance with cooking/housework;Two people to help with walking and/or transfers   Equipment Recommendations  Other (comment) (defer to next  venue)    Recommendations for Other Services      Precautions / Restrictions Precautions Precautions: Fall Precaution Comments: sternal pain Restrictions Weight Bearing Restrictions: No       Mobility Bed Mobility Overal bed mobility: Needs Assistance Bed Mobility: Supine to Sit, Sit to Supine     Supine to sit: Total assist, +2 for physical assistance, +2 for safety/equipment Sit to supine: Total assist, +2 for physical assistance, +2 for safety/equipment   General bed mobility comments: assist with legs and trunk, use of bed pad to move around to bed edge. Total of 2 to return to supine.    Transfers                         Balance Overall balance assessment: Needs assistance Sitting-balance support: Feet supported, Bilateral upper extremity supported Sitting balance-Leahy Scale: Poor Sitting balance - Comments: froward flexed posture, support  by therapist.  sat on edge x ~ 5 minutes, listing to right                                   ADL either performed or assessed with clinical judgement   ADL Overall ADL's : Needs assistance/impaired   Eating/Feeding Details (indicate cue type and reason): family was educated on importance of holding cup to take drinks. patients daughter verbalized understanding.  Extremity/Trunk Assessment              Vision       Perception     Praxis      Cognition Arousal/Alertness: Lethargic Behavior During Therapy: Flat affect Overall Cognitive Status: Impaired/Different from baseline                                 General Comments: answers with encouragement        Exercises      Shoulder Instructions       General Comments      Pertinent Vitals/ Pain       Pain Assessment Pain Assessment: Faces Faces Pain Scale: Hurts even more Pain Location: sternal pain - with movement and cough Pain Descriptors / Indicators:  Grimacing, Moaning Pain Intervention(s): Monitored during session, Premedicated before session, Repositioned  Home Living                                          Prior Functioning/Environment              Frequency  Min 2X/week        Progress Toward Goals  OT Goals(current goals can now be found in the care plan section)  Progress towards OT goals: OT to reassess next treatment     Plan Discharge plan remains appropriate    Co-evaluation    PT/OT/SLP Co-Evaluation/Treatment: Yes Reason for Co-Treatment: To address functional/ADL transfers;For patient/therapist safety PT goals addressed during session: Mobility/safety with mobility OT goals addressed during session: ADL's and self-care      AM-PAC OT "6 Clicks" Daily Activity     Outcome Measure   Help from another person eating meals?: A Little Help from another person taking care of personal grooming?: A Little Help from another person toileting, which includes using toliet, bedpan, or urinal?: Total Help from another person bathing (including washing, rinsing, drying)?: A Lot Help from another person to put on and taking off regular upper body clothing?: A Lot Help from another person to put on and taking off regular lower body clothing?: Total 6 Click Score: 12    End of Session    OT Visit Diagnosis: Muscle weakness (generalized) (M62.81);Pain   Activity Tolerance Patient limited by fatigue;Patient limited by pain   Patient Left in bed;with call bell/phone within reach;with bed alarm set;with family/visitor present   Nurse Communication Mobility status        Time: 1020-1047 OT Time Calculation (min): 27 min  Charges: OT General Charges $OT Visit: 1 Visit OT Treatments $Therapeutic Activity: 8-22 mins  Jackelyn Poling OTR/L, MS Acute Rehabilitation Department Office# [phone] Pager# [phone]   Marcellina Millin 05/06/2021, 12:51 PM

## 2021-05-07 ENCOUNTER — Ambulatory Visit (HOSPITAL_COMMUNITY): Admission: RE | Admit: 2021-05-07 | Payer: Medicare PPO | Source: Ambulatory Visit

## 2021-05-07 ENCOUNTER — Inpatient Hospital Stay (HOSPITAL_COMMUNITY): Payer: Medicare PPO

## 2021-05-07 DIAGNOSIS — M7989 Other specified soft tissue disorders: Secondary | ICD-10-CM

## 2021-05-07 DIAGNOSIS — R55 Syncope and collapse: Secondary | ICD-10-CM | POA: Diagnosis not present

## 2021-05-07 LAB — CBC WITH DIFFERENTIAL/PLATELET
Abs Immature Granulocytes: 0.27 10*3/uL — ABNORMAL HIGH (ref 0.00–0.07)
Basophils Absolute: 0.1 10*3/uL (ref 0.0–0.1)
Basophils Relative: 1 %
Eosinophils Absolute: 0.2 10*3/uL (ref 0.0–0.5)
Eosinophils Relative: 2 %
HCT: 25.5 % — ABNORMAL LOW (ref 36.0–46.0)
Hemoglobin: 8.2 g/dL — ABNORMAL LOW (ref 12.0–15.0)
Immature Granulocytes: 3 %
Lymphocytes Relative: 14 %
Lymphs Abs: 1.1 10*3/uL (ref 0.7–4.0)
MCH: 32.3 pg (ref 26.0–34.0)
MCHC: 32.2 g/dL (ref 30.0–36.0)
MCV: 100.4 fL — ABNORMAL HIGH (ref 80.0–100.0)
Monocytes Absolute: 0.7 10*3/uL (ref 0.1–1.0)
Monocytes Relative: 9 %
Neutro Abs: 5.7 10*3/uL (ref 1.7–7.7)
Neutrophils Relative %: 71 %
Platelets: 350 10*3/uL (ref 150–400)
RBC: 2.54 MIL/uL — ABNORMAL LOW (ref 3.87–5.11)
RDW: 14.5 % (ref 11.5–15.5)
WBC: 8 10*3/uL (ref 4.0–10.5)
nRBC: 0 % (ref 0.0–0.2)

## 2021-05-07 LAB — COMPREHENSIVE METABOLIC PANEL
ALT: 18 U/L (ref 0–44)
AST: 17 U/L (ref 15–41)
Albumin: 2.3 g/dL — ABNORMAL LOW (ref 3.5–5.0)
Alkaline Phosphatase: 83 U/L (ref 38–126)
Anion gap: 9 (ref 5–15)
BUN: 29 mg/dL — ABNORMAL HIGH (ref 8–23)
CO2: 27 mmol/L (ref 22–32)
Calcium: 8.2 mg/dL — ABNORMAL LOW (ref 8.9–10.3)
Chloride: 92 mmol/L — ABNORMAL LOW (ref 98–111)
Creatinine, Ser: 0.78 mg/dL (ref 0.44–1.00)
GFR, Estimated: >60 mL/min (ref >60)
Glucose, Bld: 122 mg/dL — ABNORMAL HIGH (ref 70–99)
Potassium: 3.8 mmol/L (ref 3.5–5.1)
Sodium: 128 mmol/L — ABNORMAL LOW (ref 135–145)
Total Bilirubin: 0.5 mg/dL (ref 0.3–1.2)
Total Protein: 5 g/dL — ABNORMAL LOW (ref 6.5–8.1)

## 2021-05-07 LAB — PREALBUMIN: Prealbumin: 15.3 mg/dL — ABNORMAL LOW (ref 18–38)

## 2021-05-07 LAB — GLUCOSE, CAPILLARY: Glucose-Capillary: 127 mg/dL — ABNORMAL HIGH (ref 70–99)

## 2021-05-07 LAB — URINALYSIS, ROUTINE W REFLEX MICROSCOPIC
Bilirubin Urine: NEGATIVE
Glucose, UA: NEGATIVE mg/dL
Ketones, ur: NEGATIVE mg/dL
Leukocytes,Ua: NEGATIVE
Nitrite: NEGATIVE
Protein, ur: NEGATIVE mg/dL
Specific Gravity, Urine: 1.018 (ref 1.005–1.030)
pH: 5 (ref 5.0–8.0)

## 2021-05-07 LAB — SODIUM, URINE, RANDOM: Sodium, Ur: 16 mmol/L

## 2021-05-07 LAB — OSMOLALITY, URINE: Osmolality, Ur: 598 mOsm/kg (ref 300–900)

## 2021-05-07 LAB — MAGNESIUM: Magnesium: 1.8 mg/dL (ref 1.7–2.4)

## 2021-05-07 MED ORDER — IRBESARTAN 150 MG PO TABS
75.0000 mg | ORAL_TABLET | Freq: Every day | ORAL | Status: DC
  Administered 2021-05-07 – 2021-05-10 (×4): 75 mg via ORAL
  Filled 2021-05-07 (×4): qty 1

## 2021-05-07 MED ORDER — FUROSEMIDE 10 MG/ML IJ SOLN
20.0000 mg | Freq: Two times a day (BID) | INTRAMUSCULAR | Status: DC
  Administered 2021-05-07 (×2): 20 mg via INTRAVENOUS
  Filled 2021-05-07 (×2): qty 2

## 2021-05-07 MED ORDER — ASPIRIN 81 MG PO CHEW
81.0000 mg | CHEWABLE_TABLET | Freq: Every day | ORAL | Status: DC
  Administered 2021-05-08: 81 mg via ORAL
  Filled 2021-05-07: qty 1

## 2021-05-07 MED ORDER — SODIUM CHLORIDE 0.9 % IV SOLN: INTRAVENOUS | Status: DC

## 2021-05-07 MED ORDER — SODIUM CHLORIDE 1 G PO TABS
1.0000 g | ORAL_TABLET | Freq: Two times a day (BID) | ORAL | Status: DC
  Administered 2021-05-07 – 2021-05-13 (×9): 1 g via ORAL
  Filled 2021-05-07 (×10): qty 1

## 2021-05-07 NOTE — Progress Notes (Signed)
Occupational Therapy Treatment Patient Details Name: [name] MRN: [id number] DOB: 1934/09/24 Today's Date: 05/07/2021   History of present illness 87 year old F with PMH of hiatal hernia status postrepair, dysphagia, GERD, CHF, PE not on AC, chronic hypoxic RF on 2 L at bedtime, cognitive impairment and venous insufficiency brought to ED by EMS after an episode of 3-second LOC after coughing spell followed by generalized weakness. Admitted for syncope and collapse. While in ED, CODE BLUE called when patient became pale and slumped over but no LOC.  She had brief CPR.  No VT, VF or asystole on monitor.  She later developed A. fib with RVR and had 20 beats of nonsustained VT.   OT comments  Treatment focused on RUE exercises and education with family on positioning to improve edema. LE exercises to promote activity while in bed and transfer to side of bed to improve activity tolerance. Patient still with significant pain in sternum and tolerates minimal activity. Max assist to transfer to side of bed. Tolerated less than 2 minutes at edge of bed and reported "I can't breathe." Patient returned to supine out of an abundance of caution but o2 sat and HR normal. Continue POC.   Recommendations for follow up therapy are one component of a multi-disciplinary discharge planning process, led by the attending physician.  Recommendations may be updated based on patient status, additional functional criteria and insurance authorization.    Follow Up Recommendations  Skilled nursing-short term rehab (<3 hours/day)    Assistance Recommended at Discharge Frequent or constant Supervision/Assistance  Patient can return home with the following  A lot of help with bathing/dressing/bathroom;Assistance with cooking/housework;Two people to help with walking and/or transfers   Equipment Recommendations   (defer)    Recommendations for Other Services      Precautions / Restrictions Precautions Precautions:  Fall Precaution Comments: sternal pain Restrictions Weight Bearing Restrictions: No       Mobility Bed Mobility Overal bed mobility: Needs Assistance Bed Mobility: Supine to Sit, Sit to Supine     Supine to sit: Max assist, HOB elevated Sit to supine: Total assist   General bed mobility comments: Max assist to transfer to side of bed - she was able to lead lower extremities to edge of bed but needed assistance with trunk and hips. Reported not being able to breathe at side of bed. Patient returned to supine with total assist. Vital signs normal however.    Transfers                         Balance Overall balance assessment: Needs assistance Sitting-balance support: No upper extremity supported, Feet unsupported Sitting balance-Leahy Scale: Fair                                     ADL either performed or assessed with clinical judgement   ADL                                              Extremity/Trunk Assessment Upper Extremity Assessment Upper Extremity Assessment: RUE deficits/detail RUE Deficits / Details: presents today with swollen upper arm            Vision   Vision Assessment?: No apparent visual deficits   Perception  Praxis      Cognition Arousal/Alertness: Awake/alert Behavior During Therapy: WFL for tasks assessed/performed Overall Cognitive Status: Within Functional Limits for tasks assessed                                          Exercises Other Exercises Other Exercises: encouraged RUE elbow flexion, wrist extension and forearm movemetn to reduce UE edema Other Exercises: Performed bilateral hip abduction reps x 10, hip flexion in bed x 10 each leg to promote LE movement Other Exercises: encouarged use of IS    Shoulder Instructions       General Comments      Pertinent Vitals/ Pain       Pain Assessment Pain Assessment: Faces Faces Pain Scale: Hurts whole lot Pain  Location: sternal pain - with movement and cough Pain Descriptors / Indicators: Grimacing, Moaning, Guarding Pain Intervention(s): Limited activity within patient's tolerance, Premedicated before session  Home Living                                          Prior Functioning/Environment              Frequency  Min 2X/week        Progress Toward Goals  OT Goals(current goals can now be found in the care plan section)  Progress towards OT goals: Not progressing toward goals - comment (pain)  Acute Rehab OT Goals Patient Stated Goal: get stronger OT Goal Formulation: With patient Time For Goal Achievement: 05/16/21 Potential to Achieve Goals: [place] Discharge plan remains appropriate    Co-evaluation          OT goals addressed during session: Strengthening/ROM      AM-PAC OT "6 Clicks" Daily Activity     Outcome Measure   Help from another person eating meals?: A Little Help from another person taking care of personal grooming?: A Little Help from another person toileting, which includes using toliet, bedpan, or urinal?: Total Help from another person bathing (including washing, rinsing, drying)?: A Lot Help from another person to put on and taking off regular upper body clothing?: A Lot Help from another person to put on and taking off regular lower body clothing?: Total 6 Click Score: 12    End of Session    OT Visit Diagnosis: Muscle weakness (generalized) (M62.81);Pain   Activity Tolerance Patient limited by pain;Patient limited by fatigue   Patient Left in bed;with call bell/phone within reach;with bed alarm set;with family/visitor present   Nurse Communication Mobility status        Time: 7062-3762 OT Time Calculation (min): 26 min  Charges: OT General Charges $OT Visit: 1 Visit OT Treatments $Therapeutic Activity: 8-22 mins $Therapeutic Exercise: 8-22 mins  Derl Barrow, OTR/L [place]  Office  [phone] Pager: [place] 05/07/2021, 2:09 PM

## 2021-05-07 NOTE — Progress Notes (Addendum)
Patient ID: [name], female   DOB: [birth year], 87 y.o.   MRN: [id number]    Progress Note   Subjective  Day # 7  CC; Dysphagia  CXR-lung volumes lower than in prior study, unchanged small left pleural effusion new small right pleural effusion, increased bibasilar opacities may reflect atelectasis versus infection  Sats in 90s on 2 L  Labs today-WBC 8.0/hemoglobin 8.2/hematocrit 25.5 Sodium 128/potassium 3.8 creatinine 0.78  Patient is more alert this morning, daughter says she was able to drink a couple of the resource berry supplements yesterday still not eating much of anything. Patient does not have any appetite. Patient says she feels a bit more short of breath than her baseline continues to produce a lot of sputum      Objective   Vital signs in last 24 hours: Temp:  [97.5 F (36.4 C)-97.7 F (36.5 C)] 97.6 F (36.4 C) (01/19 0534) Pulse Rate:  [56-65] 59 (01/19 0534) Resp:  [16-20] 18 (01/19 0534) BP: (136-152)/(64-74) 152/66 (01/19 0534) SpO2:  [98 %-100 %] 98 % (01/19 0606) Weight:  [67.8 kg] 67.8 kg (01/19 0500) Last BM Date: 05/05/21 General: Elderly frail-appearing   white female in NAD, weak appearing, comfortable on nasal O2 2 L Tongue coated/greenish Heart:  Regular rate and rhythm; no murmurs Lungs: Respirations even and unlabored, decreased breath sounds bilateral bases, Abdomen:  Soft, nontender and nondistended. Normal bowel sounds. Extremities:  Without edema. Neurologic:  Alert and oriented,  grossly normal neurologically. Psych:  Cooperative. Normal mood and affect.  Intake/Output from previous day: 01/18 0701 - 01/19 0700 In: 480 [P.O.:480] Out: 150 [Urine:150] Intake/Output this shift: No intake/output data recorded.  Lab Results: Recent Labs    05/05/21 0750 05/06/21 0429 05/07/21 0425  WBC 9.0 8.5 8.0  HGB 8.9* 8.4* 8.2*  HCT 27.6* 25.5* 25.5*  PLT 454* 390 350   BMET Recent Labs    05/05/21 0750 05/06/21 0429  05/07/21 0425  NA 132* 129* 128*  K 3.9 4.3 3.8  CL 97* 94* 92*  CO2 28 30 27   GLUCOSE 134* 143* 122*  BUN 33* 31* 29*  CREATININE 0.94 1.06* 0.78  CALCIUM 8.2* 8.2* 8.2*   LFT Recent Labs    05/07/21 0425  PROT 5.0*  ALBUMIN 2.3*  AST 17  ALT 18  ALKPHOS 83  BILITOT 0.5   PT/INR No results for input(s): LABPROT, INR in the last 72 hours.  Studies/Results: DG CHEST PORT 1 VIEW  Result Date: 05/07/2021 CLINICAL DATA:  Pneumonia. EXAM: PORTABLE CHEST 1 VIEW COMPARISON:  05/03/2021 FINDINGS: The cardiac silhouette remains mildly enlarged. Aortic atherosclerosis is noted. Lung volumes are lower than on the prior study. A small left pleural effusion is unchanged. Mild left basilar opacity has mildly increased. There is new hazy opacity in the right lung base which partially obscures the right hemidiaphragm. No pneumothorax is identified. IMPRESSION: 1. Unchanged small left pleural effusion. New small right pleural effusion. 2. Increased bibasilar opacities which may reflect atelectasis or infection. Electronically Signed   By: Logan Bores M.D.   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 January 2022  Worsening complaints of dysphagia over the past couple of months-barium swallow this admission does show a persistent distal esophageal stricture, no recurrent hernia  Patient has not been eating well at all over several weeks, I think this is  multifactorial and not just from the esophageal stricture,  #2 chronic hypoxic respiratory failure [place] to COPD on home O2 at nighttime Some mild worsening of findings on chest x-ray, early a.m.-atelectasis versus infiltrates bases  #3 syncope on admission-torsade de points in the ER/brief CPR, no defibrillation, rapid atrial fib converted with amiodarone Remains on amiodarone  #4 Marked hypokalemia on  admission possibly precipitating above #5 adult onset diabetes mellitus #6 anemia chronic stable #7 debilitation/failure to thrive/ malnutrition #8  probable oral thrush #9 right upper extremity edema-as per medicine  Plan; continue full liquids, with resource berry supplements 3 times daily Will reassess in a.m. tentatively scheduled for EGD with esophageal dilation tomorrow afternoon BMET am , pre albumin today Start Mycostatin oral suspension 5 cc 4 times daily for thrush Discussed with Dr. Raliegh Ip this a.m. regarding failure to thrive/significant debilitation-query need for PEG tube  Principal Problem:   Syncope and collapse Active Problems:   Hypertension   GERD (gastroesophageal reflux disease)   Chronic respiratory failure (HCC)   Iron deficiency anemia   Gastro-esophageal reflux disease without esophagitis   S/P repair of paraesophageal hernia   Acute lower UTI   Diastolic dysfunction   Atrial fibrillation with RVR (HCC)   Non-sustained ventricular tachycardia   Pressure injury of skin     LOS: 7 days   Amy Esterwood PA-C 05/07/2021, 8:44 AM    Attending physician's note   I have taken an interval history, reviewed the chart and examined the patient. I agree with the Advanced Practitioner's note, impression and recommendations.   Eso dysphagia with abn Ba Swallow showing distal esophageal stricture H/O paraesophageal hernia with organoaxial volvulus s/p lap repair Jan 2022.  No recurrent hernia. Adm d/t syncope. Had cardiac arrest d/t torsade s/p CPR, d/t hypokalemia s/p K replacement, on IV amiodarone. Also with chronic hypoxic resp failure d/t COPD on nocturnal O2  Plan: -EGD with dil tomorrow. OK by cardiology -Recheck BMP in AM  Again explained risks and benefits  Carmell Austria, MD [facility] [phone]   Addendum: Pt more short of breath this evening especially after physical therapy Hold off on EGD with dil for tomorrow. Have discussed with  patient's daughter in detail regarding other options.  Have discussed risks and benefits of PEG tube again. She is willing to consider that. She will talk to her mom and dad tomorrow and discuss with her family.  She will let us know regarding their decision.  Will follow along for now   RG

## 2021-05-07 NOTE — Progress Notes (Signed)
RUE venous duplex has been completed.  Results can be found under chart review under CV PROC. 05/07/2021 1:19 PM Sarena Jezek RVT, RDMS

## 2021-05-07 NOTE — Progress Notes (Signed)
PROGRESS NOTE   [name]  KVQ:[id number] DOB: [birth year] DOA: 04/30/2021 PCP: Aretta Nip, MD  Brief Narrative:   87 year old white female Prior large paraesophageal hernia with organoaxial volvulus status postrepair 04/28/2020 Dr. Kipp Brood Chronic respiratory failure on 3 L oxygen Prior pulmonary embolism no anticoagulation Prior cholecystectomy Previous transaminitis?  2/2 sepsis HTN, HFpEF EF 65% 2016  Admit with 3-second loss of consciousness 04/30/2021-found to be tachycardic-developed CODE BLUE in the ED and patient slumped over found to have NSVT --potassium on admission was noted to be 2.4. Cardiology consulted--patient was started on amiodarone drip-->tablets 1/15 and transferred out of stepdown 1/16  Patient initially improved but then developed some shortness of breath and increasing pleural effusions and as AKI resolved patient was placed on diuretics  Gastroenterology was consulted secondary to need for EGD given complex hernia repair previously  Hospital-Problem based course  V. fib arrest 2/2 hypokalemia status post CPR Paroxysmal A. fib Cardiology input appreciated-no plans LifeVest ICD  Patient currently has bradycardia on monitors continue amiodarone 400 twice daily until 1/24 and then transition to 200 daily Flector patch  reassess pain etc. in a.m. discontinue IV Dilaudid use Oxy IR Sparingly every 6 as needed for moderate pain Hypoxic respiratory failure with acute superimposed on chronic HFpEF EF 60-65% Evidences of left-sided heart failure noted Lasix 20 IV twice daily no--start Lasix 20 twice daily IV Avapro downward adjusted 75 daily  Continue hydralazine 10 every 6 as needed Toxic metabolic encephalopathy on admission Combination AKI + opiates Discontinue Dilaudid-continue cautious oxycodone for pain control-see below Dysphagia reflux hernia status postrepair 2021 Gastroenterology contemplating EGD 1/20--patient cleared by cardiology for  procedure  on liquid diet for a week PTA accounting for her AKI Likely hypervolemic hyponatremia-serum osmolality 273-urine studies pending Contemplated IV fluid to raise sodium-CXR shows edema therefore start salt tablets Twice daily Lasix as below Normocytic anemia dilutional Monitor only RUE pain/swelling Noticed on 1/19 and painful--seems like superficial phlebitis Will still r/o UE DVT Elevate extremity--ice/heat Resolved AKI-peak 3.99 creatinine Continue oral fluids at this time Urinary tract infection from pta  Received 3 days of ceftriaxone ending 1/15  DVT prophylaxis: Lovenox Code Status: Full Family Communication:  Disposition: Discussed with patient's daughter and husband at the bedside Status is: Inpatient  Remains inpatient appropriate because: Not stable for discharge     Consultants:  Cardiology Gastroenterology  Procedures:  CPR  Antimicrobials: Rocephin   Subjective:  Still SOB--Feels maybe more winded than yesterday CP persists No n/v R upper arm is quite swollen--large area in medial bicep  Objective: Vitals:   05/07/21 0500 05/07/21 0534 05/07/21 0606 05/07/21 0949  BP:  (!) 152/66  (!) 146/64  Pulse:  (!) 59  (!) 56  Resp:  18  18  Temp:  97.6 F (36.4 C)  97.7 F (36.5 C)  TempSrc:  Oral  Oral  SpO2:  98% 98% 100%  Weight: 67.8 kg     Height:        Intake/Output Summary (Last 24 hours) at 05/07/2021 1013 Last data filed at 05/07/2021 0650 Gross per 24 hour  Intake 480 ml  Output 150 ml  Net 330 ml    Filed Weights   05/05/21 0500 05/06/21 0500 05/07/21 0500  Weight: 68.3 kg 67.6 kg 67.8 kg    Examination:  Awake coherent iin some resp distress no focal defciit Cta b, decreased AE in lower lung fields Abd soft nt nd no rebound R UE has duck egg sized swelling--note IV track  mark just below it Neruo intact--but appears tired/weak   Data Reviewed: personally reviewed   CBC    Component Value Date/Time   WBC 8.0  05/07/2021 0425   RBC 2.54 (L) 05/07/2021 0425   HGB 8.2 (L) 05/07/2021 0425   HGB 12.4 09/27/2016 1349   HCT 25.5 (L) 05/07/2021 0425   HCT 38.4 09/27/2016 1349   PLT 350 05/07/2021 0425   PLT 263 09/27/2016 1349   MCV 100.4 (H) 05/07/2021 0425   MCV 89.1 09/27/2016 1349   MCH 32.3 05/07/2021 0425   MCHC 32.2 05/07/2021 0425   RDW 14.5 05/07/2021 0425   RDW 17.3 (H) 09/27/2016 1349   LYMPHSABS 1.1 05/07/2021 0425   LYMPHSABS 1.5 09/27/2016 1349   MONOABS 0.7 05/07/2021 0425   MONOABS 0.7 09/27/2016 1349   EOSABS 0.2 05/07/2021 0425   EOSABS 0.1 09/27/2016 1349   BASOSABS 0.1 05/07/2021 0425   BASOSABS 0.0 09/27/2016 1349   CMP Latest Ref Rng & Units 05/07/2021 05/06/2021 05/05/2021  Glucose 70 - 99 mg/dL 122(H) 143(H) 134(H)  BUN 8 - 23 mg/dL 29(H) 31(H) 33(H)  Creatinine 0.44 - 1.00 mg/dL 0.78 1.06(H) 0.94  Sodium 135 - 145 mmol/L 128(L) 129(L) 132(L)  Potassium 3.5 - 5.1 mmol/L 3.8 4.3 3.9  Chloride 98 - 111 mmol/L 92(L) 94(L) 97(L)  CO2 22 - 32 mmol/L 27 30 28   Calcium 8.9 - 10.3 mg/dL 8.2(L) 8.2(L) 8.2(L)  Total Protein 6.5 - 8.1 g/dL 5.0(L) - -  Total Bilirubin 0.3 - 1.2 mg/dL 0.5 - -  Alkaline Phos 38 - 126 U/L 83 - -  AST 15 - 41 U/L 17 - -  ALT 0 - 44 U/L 18 - -     Radiology Studies: DG CHEST PORT 1 VIEW  Result Date: 05/07/2021 CLINICAL DATA:  Pneumonia. EXAM: PORTABLE CHEST 1 VIEW COMPARISON:  05/03/2021 FINDINGS: The cardiac silhouette remains mildly enlarged. Aortic atherosclerosis is noted. Lung volumes are lower than on the prior study. A small left pleural effusion is unchanged. Mild left basilar opacity has mildly increased. There is new hazy opacity in the right lung base which partially obscures the right hemidiaphragm. No pneumothorax is identified. IMPRESSION: 1. Unchanged small left pleural effusion. New small right pleural effusion. 2. Increased bibasilar opacities which may reflect atelectasis or infection. Electronically Signed   By: Logan Bores M.D.    On: 05/07/2021 08:07     Scheduled Meds:  [START ON 05/12/2021] amiodarone  200 mg Oral Daily   amiodarone  400 mg Oral BID   aspirin EC  81 mg Oral Daily   chlorhexidine  15 mL Mouth Rinse BID   diclofenac  1 patch Transdermal BID   enoxaparin (LOVENOX) injection  40 mg Subcutaneous Q24H   feeding supplement  1 Container Oral TID BM   feeding supplement  237 mL Oral BID BM   fluticasone furoate-vilanterol  1 puff Inhalation Daily   irbesartan  300 mg Oral Daily   mouth rinse  15 mL Mouth Rinse BID   mouth rinse  15 mL Mouth Rinse q12n4p   omeprazole  40 mg Oral BID   senna-docusate  2 tablet Oral BID   Continuous Infusions:  sodium chloride 40 mL/hr at 05/07/21 0958     LOS: 7 days   Time spent: [place], MD [facility] To contact the attending provider between 7A-7P or the covering provider during after hours 7P-7A, please log into the web site [email or web address] and access using universal [facility] password for that web site. If you do not have the password, please call the hospital operator.  05/07/2021, 10:13 AM

## 2021-05-07 NOTE — Plan of Care (Signed)

## 2021-05-07 NOTE — Progress Notes (Signed)
Notified on call provider that patient was complaining of right upper arm. Patient stated a "6/10" on a 0-10 pain scale, but did not want any pain medication for it. Nurse noticed patient's upper arm had some edema and felt hard to the touch. Patient's upper arm was also warm but not red. Applied hot pack to patient's upper arm to see if it would help.

## 2021-05-08 ENCOUNTER — Encounter (HOSPITAL_COMMUNITY)
Admission: EM | Disposition: A | Discharge status: Hospice | Payer: Self-pay | Source: Home / Self Care | Attending: Family Medicine

## 2021-05-08 DIAGNOSIS — R55 Syncope and collapse: Secondary | ICD-10-CM | POA: Diagnosis not present

## 2021-05-08 LAB — OSMOLALITY, URINE: Osmolality, Ur: 477 mOsm/kg (ref 300–900)

## 2021-05-08 LAB — RENAL FUNCTION PANEL
Albumin: 2.4 g/dL — ABNORMAL LOW (ref 3.5–5.0)
Anion gap: 7 (ref 5–15)
BUN: 27 mg/dL — ABNORMAL HIGH (ref 8–23)
CO2: 28 mmol/L (ref 22–32)
Calcium: 7.9 mg/dL — ABNORMAL LOW (ref 8.9–10.3)
Chloride: 93 mmol/L — ABNORMAL LOW (ref 98–111)
Creatinine, Ser: 0.89 mg/dL (ref 0.44–1.00)
GFR, Estimated: >60 mL/min (ref >60)
Glucose, Bld: 115 mg/dL — ABNORMAL HIGH (ref 70–99)
Phosphorus: 3.9 mg/dL (ref 2.5–4.6)
Potassium: 3.5 mmol/L (ref 3.5–5.1)
Sodium: 128 mmol/L — ABNORMAL LOW (ref 135–145)

## 2021-05-08 LAB — SODIUM, URINE, RANDOM: Sodium, Ur: 43 mmol/L

## 2021-05-08 LAB — GLUCOSE, CAPILLARY: Glucose-Capillary: 118 mg/dL — ABNORMAL HIGH (ref 70–99)

## 2021-05-08 SURGERY — ESOPHAGOGASTRODUODENOSCOPY (EGD) WITH PROPOFOL
Anesthesia: Monitor Anesthesia Care

## 2021-05-08 MED ORDER — FUROSEMIDE 10 MG/ML IJ SOLN
40.0000 mg | Freq: Two times a day (BID) | INTRAMUSCULAR | Status: DC
  Filled 2021-05-08: qty 4

## 2021-05-08 MED ORDER — FUROSEMIDE 10 MG/ML IJ SOLN
40.0000 mg | Freq: Two times a day (BID) | INTRAMUSCULAR | Status: DC
  Administered 2021-05-08 – 2021-05-10 (×5): 40 mg via INTRAVENOUS
  Filled 2021-05-08 (×4): qty 4

## 2021-05-08 NOTE — Consult Note (Signed)
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 UPPER VENOUS STUDY  Patient Name:  [name]  Date of Exam:   05/07/2021 Medical Rec #: [id number]       Accession #:    [id number] Date of Birth: 1935-02-12       Patient Gender: F Patient Age:   19 years Exam Location:  [facility] Procedure:      VAS [place] UPPER EXTREMITY VENOUS DUPLEX Referring Phys: Nita Sells --------------------------------------------------------------------------------  Indications: Swelling Limitations: Poor ultrasound/tissue interface and body habitus. Comparison Study: No previous exams Performing Technologist: Jody Hill RVT, RDMS  Examination Guidelines: A complete evaluation includes B-mode imaging, spectral Doppler, color Doppler, and power Doppler as needed of all accessible portions of each vessel. Bilateral testing is considered an integral part of a complete examination. Limited examinations for reoccurring indications may be performed as noted.  Right Findings:  +----------+------------+---------+-----------+----------+--------------+  RIGHT      Compressible Phasicity Spontaneous Properties    Summary      +----------+------------+---------+-----------+----------+--------------+  IJV            Full        Yes        Yes                                +----------+------------+---------+-----------+----------+--------------+  Subclavian     Full        Yes        Yes                                +----------+------------+---------+-----------+----------+--------------+  Axillary       Full        Yes        Yes                                +----------+------------+---------+-----------+----------+--------------+  Brachial       Full                                                      +----------+------------+---------+-----------+----------+--------------+  Radial         Full                                                      +----------+------------+---------+-----------+----------+--------------+  Ulnar          Full                                                      +----------+------------+---------+-----------+----------+--------------+  Cephalic                                                 Not visualized  +----------+------------+---------+-----------+----------+--------------+  Basilic        Full        Yes        Yes                                +----------+------------+---------+-----------+----------+--------------+ Only one of paired ulnar veins visualized  Summary:  Right: No evidence of deep vein thrombosis in the upper extremity. No evidence of superficial vein thrombosis in the upper extremity. However, unable to visualize the cephalic vein.  Left: No evidence of thrombosis in the subclavian.  *See table(s) above for measurements and observations.  Diagnosing physician: Orlie Pollen Electronically signed by Orlie Pollen on 05/07/2021 at 6:56:09 PM.    Final    DG ESOPHAGUS W SINGLE CM (SOL OR THIN BA)  Result Date: 05/04/2021 CLINICAL  DATA:  Esophageal stricture follow-up. EXAM: ESOPHAGUS/BARIUM SWALLOW/TABLET STUDY TECHNIQUE: Single contrast examination was performed using thin liquid barium. This exam was performed by Rushie Nyhan NP, and was supervised and interpreted by Fabiola Backer MD. FLUOROSCOPY TIME:  Radiation Exposure Index (as provided by the fluoroscopic device): 10.4 mGy If the device does not provide the exposure index: Fluoroscopy Time:  1 minute, 6 seconds Number of Acquired Images:  0 COMPARISON:  Esophagram dated June 25, 2020. FINDINGS: Swallowing: Appears normal. No vestibular penetration or aspiration seen. Pharynx: Unremarkable. Esophagus: Unchanged short segment smooth circumferential narrowing of the distal esophagus near the GE junction. Esophageal motility: Occasional mild tertiary contractions. Hiatal Hernia: None. Gastroesophageal reflux: None visualized. Ingested 56mm barium tablet: Not given Other: None. IMPRESSION: 1. Unchanged mild peptic stricture in the distal esophagus near the GE junction. 2. Mild esophageal dysmotility. Electronically Signed   By: Titus Dubin M.D.   On: 05/04/2021 16:32    Labs:  CBC: Recent Labs    05/04/21 0604 05/05/21 0750 05/06/21 0429 05/07/21 0425  WBC 11.3* 9.0 8.5 8.0  HGB 8.7* 8.9* 8.4* 8.2*  HCT 27.7* 27.6* 25.5* 25.5*  PLT 425* 454* 390 350    COAGS: No results for input(s): INR, APTT in the last 8760 hours.  BMP: Recent Labs    05/05/21 0750 05/06/21 0429 05/07/21 0425 05/08/21 0415  NA 132* 129* 128* 128*  K 3.9 4.3 3.8 3.5  CL 97* 94* 92* 93*  CO2 28 30 27 28   GLUCOSE 134* 143* 122* 115*  BUN 33* 31* 29* 27*  CALCIUM 8.2* 8.2* 8.2* 7.9*  CREATININE 0.94 1.06* 0.78 0.89  GFRNONAA 60* 52* >60 >60    LIVER FUNCTION TESTS: Recent Labs    04/30/21 1507 05/01/21 0520 05/01/21 1532 05/04/21 0604 05/07/21 0425 05/08/21 0415  BILITOT 2.8* 1.2  --  0.9 0.5  --   AST 30 45*  --  17 17  --   ALT 15 35  --  19 18  --   ALKPHOS 202*  148*  --  97 83  --   PROT 7.3 6.0*  --  5.1* 5.0*  --   ALBUMIN 3.2* 2.8* 3.0* 2.4* 2.3* 2.4*    TUMOR MARKERS: No results for input(s): AFPTM, CEA, CA199, CHROMGRNA in the last 8760 hours.  Assessment and Plan:  87 y/o F with history of hiatal hernia s/p repair January 2022 who presented to the ED 04/30/21 after syncopal episode related to coughing fit. She had an episode of torsades requiring brief compressions but no respiratory assistance, she was started on amiodarone gtt which has been transitioned to PO  without further significant cardiac events. She underwent barium esophagram due to relatively recent history of dysphagia, regurgitation and weight loss. Esophagram showed unchanged short segment smooth circumferential narrowing of the distal esophagus near the GE junction and occasional mild tertiary contractions. She was tentatively planned for EGD however this was cancelled due to increased risk for complications with sedation.  IR has been consulted for gastrostomy placement for supplemental nutrition. Patient history and imaging reviewed by Dr. Serafina Royals who approves anatomy for procedure.   We will tentatively plan on proceeding with placement 1/23 pending any emergent procedures which would take precedence. I discussed with the patient and her daughter that is possible that her procedure could be delayed or even postponed to the following day. We also discussed that if she is unable to tolerate positioning or she has respiratory decompensation we will not be able to proceed with placement, they state understanding and are agreeable to proceed.   Patient to be NPO at midnight on 1/23 - may have small sips with meds, very few ice chips and mouth swabs to wet mouth. ASA to be held starting today and may be resumed post procedure, Lovenox to be held after 1/21 dose. IR will call for patient when ready.  Risks and benefits discussed with the patient including, but not limited to the need for a  barium enema during the procedure, bleeding, infection, peritonitis, or damage to adjacent structures.  All of the patient's questions were answered, patient is agreeable to proceed.  Consent signed and in chart.   Thank you for this interesting consult.  I greatly enjoyed meeting [name] and look forward to participating in their care.  A copy of this report was sent to the requesting provider on this date.  Electronically Signed: Joaquim Nam, PA-C 05/08/2021, 2:37 PM   I spent a total of 55 Miinutes in face to face in clinical consultation, greater than 50% of which was counseling/coordinating care for gastrostomy tube placement.

## 2021-05-08 NOTE — Progress Notes (Addendum)
Patient ID: [name], female   DOB: 04-May-1934, 87 y.o.   MRN: [id number]    Progress Note   Subjective  Day #8 CC; dysphagia  Remains very weak, not eating much at all, she is trying to take the berry breeze supplements, has no appetite and also says she has been afraid to eat over the past few months for fear of anything becoming stuck.  Sats stable on 2 L  Daughter and husband at bedside, they had brief discussion with patient last evening regarding the potential feeding tube    Objective   Vital signs in last 24 hours: Temp:  [97.6 F (36.4 C)-97.8 F (36.6 C)] 97.6 F (36.4 C) (01/20 0500) Pulse Rate:  [55-61] 61 (01/20 0500) Resp:  [18-20] 19 (01/20 0100) BP: (121-156)/(57-74) 121/72 (01/20 0500) SpO2:  [99 %-100 %] 100 % (01/20 0910) FiO2 (%):  [28 %] 28 % (01/20 0910) Weight:  [67.2 kg] 67.2 kg (01/20 0500) Last BM Date: 05/05/21 General:   Elderly white female in NAD, wincing in pain from her chest wall Heart:  Regular rate and rhythm; no murmurs Lungs: Respirations even and unlabored, decreased breath sounds bilaterally bases Abdomen:  Soft, nontender and nondistended. Normal bowel sounds. Extremities:  Without edema. Neurologic:  Alert and oriented,  grossly normal neurologically. Psych:  Cooperative. Normal mood and affect.  Intake/Output from previous day: 01/19 0701 - 01/20 0700 In: 120 [P.O.:120] Out: 650 [Urine:650] Intake/Output this shift: Total I/O In: 120 [P.O.:120] Out: 200 [Urine:200]  Lab Results: Recent Labs    05/06/21 0429 05/07/21 0425  WBC 8.5 8.0  HGB 8.4* 8.2*  HCT 25.5* 25.5*  PLT 390 350   BMET Recent Labs    05/06/21 0429 05/07/21 0425 05/08/21 0415  NA 129* 128* 128*  K 4.3 3.8 3.5  CL 94* 92* 93*  CO2 30 27 28   GLUCOSE 143* 122* 115*  BUN 31* 29* 27*  CREATININE 1.06* 0.78 0.89  CALCIUM 8.2* 8.2* 7.9*   LFT Recent Labs    05/07/21 0425 05/08/21 0415  PROT 5.0*  --   ALBUMIN 2.3* 2.4*  AST 17  --    ALT 18  --   ALKPHOS 83  --   BILITOT 0.5  --    PT/INR No results for input(s): LABPROT, INR in the last 72 hours.  Studies/Results: DG CHEST PORT 1 VIEW  Result Date: 05/07/2021 CLINICAL DATA:  Pneumonia. EXAM: PORTABLE CHEST 1 VIEW COMPARISON:  05/03/2021 FINDINGS: The cardiac silhouette remains mildly enlarged. Aortic atherosclerosis is noted. Lung volumes are lower than on the prior study. A small left pleural effusion is unchanged. Mild left basilar opacity has mildly increased. There is new hazy opacity in the right lung base which partially obscures the right hemidiaphragm. No pneumothorax is identified. IMPRESSION: 1. Unchanged small left pleural effusion. New small right pleural effusion. 2. Increased bibasilar opacities which may reflect atelectasis or infection. Electronically Signed   By: Logan Bores M.D.   On: 05/07/2021 08:07   VAS [place] UPPER EXTREMITY VENOUS DUPLEX  Result Date: 05/07/2021 UPPER VENOUS STUDY  Patient Name:  CHIANTE PEDEN  Date of Exam:   05/07/2021 Medical Rec #: [id number]       Accession #:    [id number] Date of Birth: 04/10/1935       Patient Gender: F Patient Age:   25 years Exam Location:  [facility] Procedure:      VAS [place] UPPER EXTREMITY VENOUS DUPLEX Referring Phys: [facility]  SAMTANI --------------------------------------------------------------------------------  Indications: Swelling Limitations: Poor ultrasound/tissue interface and body habitus. Comparison Study: No previous exams Performing Technologist: Jody Hill RVT, RDMS  Examination Guidelines: A complete evaluation includes B-mode imaging, spectral Doppler, color Doppler, and power Doppler as needed of all accessible portions of each vessel. Bilateral testing is considered an integral part of a complete examination. Limited examinations for reoccurring indications may be performed as noted.  Right Findings: +----------+------------+---------+-----------+----------+--------------+  RIGHT       Compressible Phasicity Spontaneous Properties    Summary      +----------+------------+---------+-----------+----------+--------------+  IJV            Full        Yes        Yes                                +----------+------------+---------+-----------+----------+--------------+  Subclavian     Full        Yes        Yes                                +----------+------------+---------+-----------+----------+--------------+  Axillary       Full        Yes        Yes                                +----------+------------+---------+-----------+----------+--------------+  Brachial       Full                                                      +----------+------------+---------+-----------+----------+--------------+  Radial         Full                                                      +----------+------------+---------+-----------+----------+--------------+  Ulnar          Full                                                      +----------+------------+---------+-----------+----------+--------------+  Cephalic                                                 Not visualized  +----------+------------+---------+-----------+----------+--------------+  Basilic        Full        Yes        Yes                                +----------+------------+---------+-----------+----------+--------------+ Only one of paired ulnar veins visualized  Summary:  Right: No evidence of deep vein thrombosis in the upper extremity. No evidence of superficial  vein thrombosis in the upper extremity. However, unable to visualize the cephalic vein.  Left: No evidence of thrombosis in the subclavian.  *See table(s) above for measurements and observations.  Diagnosing physician: Orlie Pollen Electronically signed by Orlie Pollen on 05/07/2021 at 6:56:09 PM.    Final        Assessment / Plan:    #8 87 year old white female with worsening dysphagia over the past few months prior to admission in setting of prior paraesophageal  hernia repair January 2022 She has persistent narrowing in the distal esophagus-likely secondary to stenosis postoperatively/tight wrap  #2 malnutrition-multifactoral.  Patient is extremely frail and weak at present after all of the events over the past week, had lost quite a bit of weight prior to hospitalization and really too weak at this point to eat  #3 chronic hypoxic respiratory failure secondary to COPD on home O2 #4 admitted with syncope-torsade the point in ER requiring brief CPR/no defibrillation-rapid A. Fib, heard in setting of hypokalemia  #5 persistent chest wall pain post CPR #6 anemia chronic stable #7 congestive heart failure superimposed, being diuresed #8.  Adult onset diabetes mellitus  #9 oral thrush-treating  Plan; have canceled current plans for EGD with dilation, this can be reconsidered when she is stronger, concerned with significant increased risk for complications with sedation  Long discussion today with the patient and family regarding IR placed gastrostomy tube.  I explained that once these are placed they need to be left in place for at least 3 months, she certainly can continue to eat what ever she can and tube would be used to supplement her nutrition either with continuous feedings, nocturnal feedings or bolus feedings ending on her situation over the next few months.  I understand that plan is for rehab on discharge.  Will ask IR to see her today-anticipate this would not be done before Monday Will advance to mechanical soft diet, this was discussed in detail with family as well We will continue to try to drink the oral supplements Continue Mycostatin oral suspension for 10 days.     Principal Problem:   Syncope and collapse Active Problems:   Hypertension   GERD (gastroesophageal reflux disease)   Chronic respiratory failure (HCC)   Iron deficiency anemia   Gastro-esophageal reflux disease without esophagitis   S/P repair of paraesophageal  hernia   Acute lower UTI   Diastolic dysfunction   Atrial fibrillation with RVR (HCC)   Non-sustained ventricular tachycardia   Pressure injury of skin     LOS: 8 days   Amy Esterwood PA-C 05/08/2021, 10:51 AM    Attending physician's note   I have taken an interval history, reviewed the chart and examined the patient. I agree with the Advanced Practitioner's note, impression and recommendations.   Pt with very high risk for cardiopulmonary compromise with EGD.  Hence EGD is canceled for now.  We have discussed extensively with patient, patient's daughter and patient's husband regarding just encouraging p.o.  vs PEG tube in addition.  Since Ms Milford does not have significant p.o. intake, daughter would like to proceed with PEG tube.  Patient understands as well.   Plan: -IR consultation for PEG tube early next week.  I have discussed risks and benefits in detail. -Advance diet to mechanically soft diet (if she is able to tolerate) -Continue omeprazole 40 twice daily.  Can open the capsule and put it in applesauce. -She is to FU with Dr. Fuller Plan as outpt -May need rehab placement. -  Will sign off for now.   Carmell Austria, MD [facility] [phone]

## 2021-05-08 NOTE — Progress Notes (Signed)
Physical Therapy Treatment Patient Details Name: [name] MRN: [id number] DOB: [birth year] Today's Date: 05/08/2021   History of Present Illness 87 year old F with PMH of hiatal hernia status post repair, dysphagia, GERD, CHF, PE not on AC, chronic hypoxic RF on 2 L at bedtime, cognitive impairment and venous insufficiency brought to ED by EMS after an episode of 3-second LOC after coughing spell followed by generalized weakness. Admitted for syncope and collapse. While in ED, [facility] called when patient became pale and slumped over but no LOC.  She had brief CPR.  No VT, VF or asystole on monitor.  She later developed A. fib with RVR and had 20 beats of nonsustained VT.    PT Comments    Pt agreeable to attempt mobility and reports feeling a little better today. Pt required less assist for bed mobility however remains limited by sternal pain, also feeling dizzy with sitting EOB today (see below, RN also aware).  IR PA into room to discuss possible G-tube on Monday end of session.    Recommendations for follow up therapy are one component of a multi-disciplinary discharge planning process, led by the attending physician.  Recommendations may be updated based on patient status, additional functional criteria and insurance authorization.  Follow Up Recommendations  Skilled nursing-short term rehab (<3 hours/day)     Assistance Recommended at Discharge Frequent or constant Supervision/Assistance  Patient can return home with the following Two people to help with walking and/or transfers;A lot of help with bathing/dressing/bathroom;Assistance with cooking/housework;Direct supervision/assist for medications management;Assist for transportation;Assistance with feeding;Direct supervision/assist for financial management;Help with stairs or ramp for entrance   Equipment Recommendations  None recommended by PT    Recommendations for Other Services       Precautions / Restrictions  Precautions Precautions: Fall Precaution Comments: sternum and across to right chest wall painful     Mobility  Bed Mobility Overal bed mobility: Needs Assistance Bed Mobility: Supine to Sit, Sit to Supine     Supine to sit: HOB elevated, Min assist, +2 for safety/equipment Sit to supine: Mod assist, +2 for safety/equipment   General bed mobility comments: pt assisting as able, provided assist for trunk, continues to report sternal pain however better able to assist today,  pt requiring more assist upon return to bed due to dizziness, assist for LEs onto bed; BP 145/57 mmHg and HR 57 bpm (RN notified and aware)    Transfers                        Ambulation/Gait                   Stairs             Wheelchair Mobility    Modified Rankin (Stroke Patients Only)       Balance Overall balance assessment: Needs assistance Sitting-balance support: Single extremity supported, Feet supported Sitting balance-Leahy Scale: Poor Sitting balance - Comments: froward flexed posture and lean, able to correct with cues, assist required today likely due to dizziness                                    Cognition Arousal/Alertness: Awake/alert Behavior During Therapy: WFL for tasks assessed/performed Overall Cognitive Status: Within Functional Limits for tasks assessed  Exercises      General Comments        Pertinent Vitals/Pain Pain Assessment Pain Assessment: Faces Faces Pain Scale: Hurts whole lot Pain Location: sternum, right flank with movement Pain Intervention(s): Repositioned, Monitored during session (declines pain meds once returned to bed)    Home Living                          Prior Function            PT Goals (current goals can now be found in the care plan section) Progress towards PT goals: Progressing toward goals    Frequency    Min  2X/week      PT Plan Current plan remains appropriate    Co-evaluation              AM-PAC PT "6 Clicks" Mobility   Outcome Measure  Help needed turning from your back to your side while in a flat bed without using bedrails?: A Lot Help needed moving from lying on your back to sitting on the side of a flat bed without using bedrails?: A Lot Help needed moving to and from a bed to a chair (including a wheelchair)?: Total Help needed standing up from a chair using your arms (e.g., wheelchair or bedside chair)?: Total Help needed to walk in hospital room?: Total Help needed climbing 3-5 steps with a railing? : Total 6 Click Score: 8    End of Session Equipment Utilized During Treatment: Oxygen Activity Tolerance: Patient limited by fatigue;Patient limited by pain Patient left: in bed;with call bell/phone within reach;with family/visitor present Nurse Communication: Mobility status PT Visit Diagnosis: Unsteadiness on feet (R26.81);Difficulty in walking, not elsewhere classified (R26.2)     Time: 9244-6286 PT Time Calculation (min) (ACUTE ONLY): 13 min  Charges:  $Therapeutic Activity: 8-22 mins                    Jannette Spanner PT, DPT Acute Rehabilitation Services Pager: [phone] Office: [place] 05/08/2021, 4:10 PM

## 2021-05-08 NOTE — Progress Notes (Signed)
PROGRESS NOTE   [name]  AVW:[id number] DOB: [birth year] DOA: 04/30/2021 PCP: Aretta Nip, MD  Brief Narrative:   87 year old white female Prior large paraesophageal hernia with organoaxial volvulus status postrepair 04/28/2020 Dr. Kipp Brood Chronic respiratory failure on 3 L oxygen Prior pulmonary embolism no anticoagulation Prior cholecystectomy Previous transaminitis?  2/2 sepsis HTN, HFpEF EF 65% 2016  Admit with 3-second loss of consciousness 04/30/2021-found to be tachycardic-developed CODE BLUE in the ED and patient slumped over found to have NSVT --potassium on admission was noted to be 2.4. Cardiology consulted--patient was started on amiodarone drip-->tablets 1/15 and transferred out of stepdown 1/16  Patient initially improved but then developed some shortness of breath and increasing pleural effusions and as AKI resolved patient was placed on diuretics  Gastroenterology was consulted secondary to need for EGD given complex hernia repair previously-currently we have suspended EGD plans and patient will get a PEG tube for nutrition in the next several days  Hospital-Problem based course  V. fib arrest 2/2 hypokalemia status post CPR Paroxysmal A. fib Cardiology input appreciated-no plans LifeVest ICD  Patient currently has bradycardia on monitors continue amiodarone 400 twice daily until 1/24 and then transition to 200 daily Flector patch-use Oxy IR Sparingly every 6 as needed for moderate pain--she is improved from this perspective Hypoxic respiratory failure with acute superimposed on chronic HFpEF EF 60-65% Evidences of left-sided heart failure noted 1/19 with rales less short of breath more coherent Continuing Lasix 20 twice daily IV Avapro downward adjusted 75 daily  Continue hydralazine 10 every 6 as needed Toxic metabolic encephalopathy on admission Combination AKI + opiates Discontinue Dilaudid-continue cautious oxycodone for pain control-see  below Dysphagia reflux hernia status postrepair 2021 Gastroenterology patient too high risk for EGD given cardiorespiratory status PEG tube placement requested in the next several days hypervolemic hyponatremia-and some component of SIADH serum osmolality 273-urine studies have improved slightly Discussed with Dr. Osborne Casco of nephrology 1/20 Continue salt tablets 2 g twice daily She is eating lots of ice chips and I have told family to bring in substances that have more solute to drink If urine sodium drops below 125 we will consider urea 15 g twice daily Normocytic anemia dilutional Monitor only RUE pain/swelling secondary to superficial phlebitis Elevate extremity--ice/heat, ultrasound 1/19 rules out DVT Resolved AKI-peak 3.99 creatinine Continue oral fluids at this time Urinary tract infection from pta  Received 3 days of ceftriaxone ending 1/15 Patient is still having dysuria and I think she may have senile vaginosis which we will try to treat  DVT prophylaxis: Lovenox Code Status: Full Family Communication:  Disposition: Discussed with patient's daughter and husband at the bedside Status is: Inpatient  Remains inpatient appropriate because: Not stable for discharge     Consultants:  Cardiology Gastroenterology  Procedures:  CPR  Antimicrobials: Rocephin   Subjective:  Patient's respiratory distress is much improved still has chest pain No nausea no vomiting  Objective: Vitals:   05/08/21 0100 05/08/21 0500 05/08/21 0910 05/08/21 1220  BP: 127/74 121/72  (!) 125/57  Pulse: (!) 57 61  (!) 58  Resp: 19   20  Temp: 97.8 F (36.6 C) 97.6 F (36.4 C)  (!) 97.5 F (36.4 C)  TempSrc: Oral Oral  Oral  SpO2:   100% 97%  Weight:  67.2 kg    Height:        Intake/Output Summary (Last 24 hours) at 05/08/2021 1605 Last data filed at 05/08/2021 1300 Gross per 24 hour  Intake 360 ml  Output 750 ml  Net -390 ml    Filed Weights   05/06/21 0500 05/07/21 0500  05/08/21 0500  Weight: 67.6 kg 67.8 kg 67.2 kg    Examination:  Awake coherent in some resp distress no focal defciit Cta b, decreased AE in lower lung fields Abd soft nt nd no rebound R UE less erythematous swollen Neruo intact--but appears tired/weak   Data Reviewed: personally reviewed   CBC    Component Value Date/Time   WBC 8.0 05/07/2021 0425   RBC 2.54 (L) 05/07/2021 0425   HGB 8.2 (L) 05/07/2021 0425   HGB 12.4 09/27/2016 1349   HCT 25.5 (L) 05/07/2021 0425   HCT 38.4 09/27/2016 1349   PLT 350 05/07/2021 0425   PLT 263 09/27/2016 1349   MCV 100.4 (H) 05/07/2021 0425   MCV 89.1 09/27/2016 1349   MCH 32.3 05/07/2021 0425   MCHC 32.2 05/07/2021 0425   RDW 14.5 05/07/2021 0425   RDW 17.3 (H) 09/27/2016 1349   LYMPHSABS 1.1 05/07/2021 0425   LYMPHSABS 1.5 09/27/2016 1349   MONOABS 0.7 05/07/2021 0425   MONOABS 0.7 09/27/2016 1349   EOSABS 0.2 05/07/2021 0425   EOSABS 0.1 09/27/2016 1349   BASOSABS 0.1 05/07/2021 0425   BASOSABS 0.0 09/27/2016 1349   CMP Latest Ref Rng & Units 05/08/2021 05/07/2021 05/06/2021  Glucose 70 - 99 mg/dL 115(H) 122(H) 143(H)  BUN 8 - 23 mg/dL 27(H) 29(H) 31(H)  Creatinine 0.44 - 1.00 mg/dL 0.89 0.78 1.06(H)  Sodium 135 - 145 mmol/L 128(L) 128(L) 129(L)  Potassium 3.5 - 5.1 mmol/L 3.5 3.8 4.3  Chloride 98 - 111 mmol/L 93(L) 92(L) 94(L)  CO2 22 - 32 mmol/L 28 27 30   Calcium 8.9 - 10.3 mg/dL 7.9(L) 8.2(L) 8.2(L)  Total Protein 6.5 - 8.1 g/dL - 5.0(L) -  Total Bilirubin 0.3 - 1.2 mg/dL - 0.5 -  Alkaline Phos 38 - 126 U/L - 83 -  AST 15 - 41 U/L - 17 -  ALT 0 - 44 U/L - 18 -     Radiology Studies: DG CHEST PORT 1 VIEW  Result Date: 05/07/2021 CLINICAL DATA:  Pneumonia. EXAM: PORTABLE CHEST 1 VIEW COMPARISON:  05/03/2021 FINDINGS: The cardiac silhouette remains mildly enlarged. Aortic atherosclerosis is noted. Lung volumes are lower than on the prior study. A small left pleural effusion is unchanged. Mild left basilar opacity has  mildly increased. There is new hazy opacity in the right lung base which partially obscures the right hemidiaphragm. No pneumothorax is identified. IMPRESSION: 1. Unchanged small left pleural effusion. New small right pleural effusion. 2. Increased bibasilar opacities which may reflect atelectasis or infection. Electronically Signed   By: Logan Bores M.D.   On: 05/07/2021 08:07   VAS [place] UPPER EXTREMITY VENOUS DUPLEX  Result Date: 05/07/2021 UPPER VENOUS STUDY  Patient Name:  CALEB PRIGMORE  Date of Exam:   05/07/2021 Medical Rec #: [id number]       Accession #:    [id number] Date of Birth: 1934-07-03       Patient Gender: F Patient Age:   32 years Exam Location:  [facility] Procedure:      VAS [place] UPPER EXTREMITY VENOUS DUPLEX Referring Phys: Nita Sells --------------------------------------------------------------------------------  Indications: Swelling Limitations: Poor ultrasound/tissue interface and body habitus. Comparison Study: No previous exams Performing Technologist: Jody Hill RVT, RDMS  Examination Guidelines: A complete evaluation includes B-mode imaging, spectral Doppler, color Doppler, and power Doppler as needed of all accessible portions of each vessel.  Bilateral testing is considered an integral part of a complete examination. Limited examinations for reoccurring indications may be performed as noted.  Right Findings: +----------+------------+---------+-----------+----------+--------------+  RIGHT      Compressible Phasicity Spontaneous Properties    Summary      +----------+------------+---------+-----------+----------+--------------+  IJV            Full        Yes        Yes                                +----------+------------+---------+-----------+----------+--------------+  Subclavian     Full        Yes        Yes                                +----------+------------+---------+-----------+----------+--------------+  Axillary       Full        Yes        Yes                                 +----------+------------+---------+-----------+----------+--------------+  Brachial       Full                                                      +----------+------------+---------+-----------+----------+--------------+  Radial         Full                                                      +----------+------------+---------+-----------+----------+--------------+  Ulnar          Full                                                      +----------+------------+---------+-----------+----------+--------------+  Cephalic                                                 Not visualized  +----------+------------+---------+-----------+----------+--------------+  Basilic        Full        Yes        Yes                                +----------+------------+---------+-----------+----------+--------------+ Only one of paired ulnar veins visualized  Summary:  Right: No evidence of deep vein thrombosis in the upper extremity. No evidence of superficial vein thrombosis in the upper extremity. However, unable to visualize the cephalic vein.  Left: No evidence of thrombosis in the subclavian.  *See table(s) above for measurements and observations.  Diagnosing physician: Orlie Pollen Electronically signed by Orlie Pollen on 05/07/2021 at 6:56:09 PM.  Final      Scheduled Meds:  [START ON 05/12/2021] amiodarone  200 mg Oral Daily   amiodarone  400 mg Oral BID   chlorhexidine  15 mL Mouth Rinse BID   diclofenac  1 patch Transdermal BID   enoxaparin (LOVENOX) injection  40 mg Subcutaneous Q24H   feeding supplement  1 Container Oral TID BM   feeding supplement  237 mL Oral BID BM   fluticasone furoate-vilanterol  1 puff Inhalation Daily   furosemide  40 mg Intravenous BID   irbesartan  75 mg Oral Daily   mouth rinse  15 mL Mouth Rinse BID   mouth rinse  15 mL Mouth Rinse q12n4p   omeprazole  40 mg Oral BID   senna-docusate  2 tablet Oral BID   sodium chloride  1 g Oral BID WC   Continuous  Infusions:    LOS: 8 days   Time spent: [place], MD [facility] To contact the attending provider between 7A-7P or the covering provider during after hours 7P-7A, please log into the web site [email or web address] and access using universal [place] password for that web site. If you do not have the password, please call the hospital operator.  05/08/2021, 4:05 PM

## 2021-05-08 NOTE — TOC Progression Note (Signed)
Transition of Care [facility]) - Progression Note    Patient Details  Name: [name] MRN: [id number] Date of Birth: January 04, 1935  Transition of [facility]) CM/SW Contact  Cleone Hulick, Juliann Pulse, RN Phone Number: 05/08/2021, 10:29 AM  Clinical Narrative:   Will provide bed offers to dtr Sandra-await choice.  1. 1.3 mi [facility] [place], [place] [address] [phone] Overall rating Above average 2. 1.6 mi [place] [facility] [place], [place] [address] [phone] Overall rating Much below average 3. 2.1 mi [place], [place] [address] [phone] Overall rating Much below average 4. 2.5 mi [facility] [place], [place] [address] [phone] Overall rating Below average 5. 2.8 mi [facility] at the [place], [place] [address] [phone] Overall rating Average 6. 2.8 mi [place] [address] [place], [place] [address] [phone] Overall rating Much below average 7. 3 mi [facility] [place], [place] [address] [phone] Overall rating Above average 8. 3.6 [place] [address] [place], [place] [address] [phone] Overall rating Average 9. 3.6 mi [facility] [address] [place], [place] [address] [phone] Overall rating Much below average 10. 3.9 mi [facility] [place], [place] [address] [phone] Overall rating Much below average 11. 4.4 mi [facility] at [place], [place] [address] [phone] Overall rating Much above average 12. 4.6 mi [facility] [place], [place] [address] [phone] Overall rating Much above average 13. 5.5 mi [facility] [address] [place], [place] [address] [phone] Overall rating Above average 14. 8.2 [facility] [place], [place] [address] [phone] Overall rating Much above average 15. 9 mi [facility] [address] [place], [place] [address] [phone] Overall rating Below average 16. 9.1 [place] and [place], [place] [address] [phone] Overall rating Much below average 17. 9.2 mi [facility] [address] [place], [place] [address] [phone] Overall rating Much above average 18. 10.8 mi [place] at [facility] [address] [place], [place] [address] [phone] Overall rating Much above average 19. 12.6 mi [facility] [address] [place], [place] [address] [phone] Overall rating Much below average 20. 12.8 [facility] [place], [place] [address] [phone] Overall rating Much below average 21. 14.2 mi The [place] [address] [place], [place] [address] [phone] Overall rating Below average 22. 14.4 mi [facility] at [facility] [employer], [place] [address] [phone] Overall rating Above average 23. 14.8 mi [place] and [facility] [place], [place] [address] [phone] Overall rating Much above average 24. 14.9 [place] [address] [place], [place] [address] [phone] Overall rating Much below average 25. 16.5 mi [place] [address] [place], [place] [address] [phone] Overall rating Average 26. 16.7 mi [facility] [place],  [address] [phone] Overall rating Above average 27. 17.9 mi  [place], [place] [address] [phone] Overall rating Average 28. 91.5 [facility] [address] [place], [place] [address] [phone] Overall rating Much below average 29. 19.7 mi [place] [address] [place], [place] [address] [phone] Overall rating Much below average 30. 20 mi [address] at the [facility] at [facility], [place] [address] [phone] Overall rating Much above average 31. 21.1 mi [facility] and [facility] [place], [place] [address] [phone] Overall rating Much below average 32. 21.6 [address] [place], [place] [address] [phone] Overall rating Below average 33. 59.2 [facility] [address] [place], [place] [address] [phone] Overall rating Below average 34. 21.8 [place], [place] [address] [phone] Overall rating Above average 35. [address] [place], [place] [address] [phone] Overall rating Much above average 36. 22.6 mi [facility] [address] [place], [place] [address] [phone] Overall rating Average 37. 22.7 mi [facility] [place], [place] [address] [phone] Overall rating Much below average 38. 23.3 mi [facility] [address] [place], [place] [address] [phone] Overall rating Above average 39. 23.5 [place], [place] [address] [phone] Overall rating Not available18 40. 24.1 mi [place] [address] [place], [place] [address] [phone] Overall rating Much below average 41. 24.2 mi [place] [address] [place], [place] [address] [phone] Overall rating Below average 42. 24.4 [facility] [address] [place], [place] [address] [phone] Overall rating Much below average 43. 24.5 mi [facility] [place],  [address] [phone] Overall rating Above average To explore and download nursing   Expected Discharge Plan: [place] Barriers to Discharge: Continued Medical Work up  Expected Discharge Plan and Services Expected Discharge Plan: [place] Determinants of Health (SDOH) Interventions    Readmission Risk Interventions No flowsheet data found.

## 2021-05-08 NOTE — Care Management Important Message (Signed)
Important Message  Patient Details IM Letter placed in Patients room. Name: [name] MRN: [id number] Date of Birth: Dec 04, 1934   Medicare Important Message Given:  Yes     Kerin Salen 05/08/2021, 11:26 AM

## 2021-05-08 NOTE — Consult Note (Signed)
[facility] Inpatient Consult   05/08/2021  MEKIA DIPINTO 1934/10/14 [id number]  [place] Management [facility] CM)   Patient chart reviewed for length of stay and high risk score for unplanned readmission. Assessed for post hospital [facility] CM needs.   Per review, current recommendation and plan is for skilled nursing facility. No [facility] CM needs at this time as patient needs would be met at that level of care.  Of note, [facility] Care Management services does not replace or interfere with any services that are arranged by inpatient case management or social work.   Netta Cedars, MSN, RN [facility] [employer] [phone]  Toll free office [phone]

## 2021-05-09 LAB — BASIC METABOLIC PANEL
Anion gap: 9 (ref 5–15)
BUN: 28 mg/dL — ABNORMAL HIGH (ref 8–23)
CO2: 28 mmol/L (ref 22–32)
Calcium: 8.1 mg/dL — ABNORMAL LOW (ref 8.9–10.3)
Chloride: 93 mmol/L — ABNORMAL LOW (ref 98–111)
Creatinine, Ser: 0.99 mg/dL (ref 0.44–1.00)
GFR, Estimated: 56 mL/min — ABNORMAL LOW (ref >60)
Glucose, Bld: 105 mg/dL — ABNORMAL HIGH (ref 70–99)
Potassium: 3.1 mmol/L — ABNORMAL LOW (ref 3.5–5.1)
Sodium: 130 mmol/L — ABNORMAL LOW (ref 135–145)

## 2021-05-09 LAB — GLUCOSE, CAPILLARY: Glucose-Capillary: 110 mg/dL — ABNORMAL HIGH (ref 70–99)

## 2021-05-09 MED ORDER — POTASSIUM CHLORIDE CRYS ER 20 MEQ PO TBCR
40.0000 meq | EXTENDED_RELEASE_TABLET | Freq: Every day | ORAL | Status: DC
  Administered 2021-05-09 – 2021-05-10 (×2): 40 meq via ORAL
  Filled 2021-05-09 (×2): qty 2

## 2021-05-09 NOTE — Progress Notes (Signed)
PROGRESS NOTE   [name]  GYK:[id number] DOB: November 25, 1934 DOA: 04/30/2021 PCP: Aretta Nip, MD  Brief Narrative:   87 year old white female Prior large paraesophageal hernia with organoaxial volvulus status postrepair 04/28/2020 Dr. Kipp Brood Chronic respiratory failure on 3 L oxygen Prior pulmonary embolism no anticoagulation Prior cholecystectomy Previous transaminitis?  2/2 sepsis HTN, HFpEF EF 65% 2016  Admit with 3-second loss of consciousness 04/30/2021-found to be tachycardic-developed CODE BLUE in the ED and patient slumped over found to have NSVT--potassium on admission was noted to be 2.4. Cardiology consulted--patient was started on amiodarone drip-->tablets 1/15 and transferred out of stepdown 1/16  Patient initially improved but then developed some shortness of breath and increasing pleural effusions and as AKI resolved patient was placed on diuretics  Gastroenterology was consulted secondary to need for EGD given complex hernia repair previously-currently we have suspended EGD plans and patient will get a PEG tube for nutrition in the next several days  Hospital-Problem based course  V. fib arrest 2/2 hypokalemia status post CPR Paroxysmal A. fib Cardiology input appreciated-no plans LifeVest ICD  Patient currently has bradycardia on monitors continue amiodarone 400 twice daily until 1/24 and then transition to 200 daily Flector patch-use Oxy IR Sparingly  prn--she is improved from this perspective Hypoxic respiratory failure with acute superimposed on chronic HFpEF EF 60-65% Evidences of left-sided heart failure noted 1/19 with rales less short of breath more coherent Continuing Lasix 20 twice daily IV, is becoming net neg-continue cautious diuresis Avapro downward adjusted 75 daily  Continue hydralazine 10 every 6 as needed Toxic metabolic encephalopathy on admission Combination AKI + opiates Discontinue Dilaudid-continue cautious oxycodone for pain  control-see below Dysphagia reflux hernia status postrepair 2021 Gastroenterology patient too high risk for EGD given cardiorespiratory status PEG tube placement requested in the next several days hypervolemic hyponatremia-and some component of SIADH serum osmolality 273-urine studies have improved slightly Discussed with Dr. Joylene Grapes of nephrology 1/20 Continue salt tablets 2 g twice daily Sodium is improving with increasing solute, salt tablet- if sodium drops below 125 , then urea 15 g twice daily Hypokalemia Replace with Kdur Normocytic anemia dilutional Monitor only RUE pain/swelling secondary to superficial phlebitis Elevate extremity--ice/heat, ultrasound 1/19 rules out DVT Resolved AKI-peak 3.99 creatinine Continue oral fluids at this time Urinary tract infection from pta  Received 3 days of ceftriaxone ending 1/15 Patient is still having dysuria and I think she may have senile vaginosis which we will try to treat if this becomes recurrent  DVT prophylaxis: Lovenox Code Status: Full Family Communication: Long discussion with daughter and sister-in-law at the bedside-we discussed advanced planning in simple terms and if she does not make a durable improvement in terms of pain management etc. we will consider palliative trajectory discussions Disposition: Discussed with patient's daughter and husband at the bedside Status is: Inpatient  Remains inpatient appropriate because: Not stable for discharge     Consultants:  Cardiology Gastroenterology  Procedures:  CPR  Antimicrobials: Rocephin   Subjective:  Sleepy today-received Ativan for nausea as cannot give other meds because of her cardiac history Long discussion with daughter at the bedside today-apparently she has been sleeping most of the day and they are concerned that this change is not a good sign Patient more arousable when I attempted to awaken her-feels that she does not seem to have any improvement of the  chest pain or dyspnea-she does not complain today of any dysuria as she did yesterday No fever Nurse reports that she choked on  some fluids earlier   Objective: Vitals:   05/08/21 2137 05/09/21 0500 05/09/21 0858 05/09/21 1426  BP: 138/60 129/68  (!) 120/49  Pulse: (!) 51 (!) 59  67  Resp: 18   16  Temp: 97.9 F (36.6 C) 97.6 F (36.4 C)  (!) 97.3 F (36.3 C)  TempSrc: Oral Oral  Oral  SpO2: 100% 100% 97% 98%  Weight:      Height:        Intake/Output Summary (Last 24 hours) at 05/09/2021 1821 Last data filed at 05/09/2021 1100 Gross per 24 hour  Intake 60 ml  Output 1550 ml  Net -1490 ml    Filed Weights   05/06/21 0500 05/07/21 0500 05/08/21 0500  Weight: 67.6 kg 67.8 kg 67.2 kg    Examination:  Sleepy arouses easily no distress EOMI NCAT Neck soft On oxygen Cta b, air entry equal bilaterally posteriorly Abd soft nt nd no rebound no guarding R UE not examined today Power 5/5-still seems quite sleepy compared to prior   Data Reviewed: personally reviewed   CBC    Component Value Date/Time   WBC 8.0 05/07/2021 0425   RBC 2.54 (L) 05/07/2021 0425   HGB 8.2 (L) 05/07/2021 0425   HGB 12.4 09/27/2016 1349   HCT 25.5 (L) 05/07/2021 0425   HCT 38.4 09/27/2016 1349   PLT 350 05/07/2021 0425   PLT 263 09/27/2016 1349   MCV 100.4 (H) 05/07/2021 0425   MCV 89.1 09/27/2016 1349   MCH 32.3 05/07/2021 0425   MCHC 32.2 05/07/2021 0425   RDW 14.5 05/07/2021 0425   RDW 17.3 (H) 09/27/2016 1349   LYMPHSABS 1.1 05/07/2021 0425   LYMPHSABS 1.5 09/27/2016 1349   MONOABS 0.7 05/07/2021 0425   MONOABS 0.7 09/27/2016 1349   EOSABS 0.2 05/07/2021 0425   EOSABS 0.1 09/27/2016 1349   BASOSABS 0.1 05/07/2021 0425   BASOSABS 0.0 09/27/2016 1349   CMP Latest Ref Rng & Units 05/09/2021 05/08/2021 05/07/2021  Glucose 70 - 99 mg/dL 105(H) 115(H) 122(H)  BUN 8 - 23 mg/dL 28(H) 27(H) 29(H)  Creatinine 0.44 - 1.00 mg/dL 0.99 0.89 0.78  Sodium 135 - 145 mmol/L 130(L) 128(L)  128(L)  Potassium 3.5 - 5.1 mmol/L 3.1(L) 3.5 3.8  Chloride 98 - 111 mmol/L 93(L) 93(L) 92(L)  CO2 22 - 32 mmol/L 28 28 27   Calcium 8.9 - 10.3 mg/dL 8.1(L) 7.9(L) 8.2(L)  Total Protein 6.5 - 8.1 g/dL - - 5.0(L)  Total Bilirubin 0.3 - 1.2 mg/dL - - 0.5  Alkaline Phos 38 - 126 U/L - - 83  AST 15 - 41 U/L - - 17  ALT 0 - 44 U/L - - 18     Radiology Studies: No results found.   Scheduled Meds:  [START ON 05/12/2021] amiodarone  200 mg Oral Daily   amiodarone  400 mg Oral BID   chlorhexidine  15 mL Mouth Rinse BID   diclofenac  1 patch Transdermal BID   enoxaparin (LOVENOX) injection  40 mg Subcutaneous Q24H   feeding supplement  1 Container Oral TID BM   feeding supplement  237 mL Oral BID BM   fluticasone furoate-vilanterol  1 puff Inhalation Daily   furosemide  40 mg Intravenous BID   irbesartan  75 mg Oral Daily   mouth rinse  15 mL Mouth Rinse BID   mouth rinse  15 mL Mouth Rinse q12n4p   omeprazole  40 mg Oral BID   potassium chloride  40 mEq Oral Daily  senna-docusate  2 tablet Oral BID   sodium chloride  1 g Oral BID WC   Continuous Infusions:    LOS: 9 days   Time spent: [place], MD [facility] To contact the attending provider between 7A-7P or the covering provider during after hours 7P-7A, please log into the web site [email or web address] and access using universal [place] password for that web site. If you do not have the password, please call the hospital operator.  05/09/2021, 6:21 PM

## 2021-05-10 LAB — COMPREHENSIVE METABOLIC PANEL
ALT: 15 U/L (ref 0–44)
AST: 18 U/L (ref 15–41)
Albumin: 2.4 g/dL — ABNORMAL LOW (ref 3.5–5.0)
Alkaline Phosphatase: 91 U/L (ref 38–126)
Anion gap: 9 (ref 5–15)
BUN: 29 mg/dL — ABNORMAL HIGH (ref 8–23)
CO2: 26 mmol/L (ref 22–32)
Calcium: 8.1 mg/dL — ABNORMAL LOW (ref 8.9–10.3)
Chloride: 94 mmol/L — ABNORMAL LOW (ref 98–111)
Creatinine, Ser: 1.06 mg/dL — ABNORMAL HIGH (ref 0.44–1.00)
GFR, Estimated: 52 mL/min — ABNORMAL LOW (ref >60)
Glucose, Bld: 104 mg/dL — ABNORMAL HIGH (ref 70–99)
Potassium: 3.3 mmol/L — ABNORMAL LOW (ref 3.5–5.1)
Sodium: 129 mmol/L — ABNORMAL LOW (ref 135–145)
Total Bilirubin: 0.9 mg/dL (ref 0.3–1.2)
Total Protein: 4.9 g/dL — ABNORMAL LOW (ref 6.5–8.1)

## 2021-05-10 LAB — GLUCOSE, CAPILLARY: Glucose-Capillary: 111 mg/dL — ABNORMAL HIGH (ref 70–99)

## 2021-05-10 MED ORDER — FUROSEMIDE 40 MG PO TABS: 40.0000 mg | ORAL_TABLET | Freq: Every day | ORAL | Status: DC

## 2021-05-10 MED ORDER — FUROSEMIDE 20 MG PO TABS
20.0000 mg | ORAL_TABLET | Freq: Every evening | ORAL | Status: DC
  Administered 2021-05-10: 20 mg via ORAL
  Filled 2021-05-10: qty 1

## 2021-05-10 NOTE — Progress Notes (Addendum)
PROGRESS NOTE   [name]  EHU:[id number] DOB: July 25, 1934 DOA: 04/30/2021 PCP: Aretta Nip, MD  Brief Narrative:   87 year old white female Prior large paraesophageal hernia with organoaxial volvulus status postrepair 04/28/2020 Dr. Kipp Brood Chronic respiratory failure on 3 L oxygen Prior pulmonary embolism no anticoagulation Prior cholecystectomy Previous transaminitis?  2/2 sepsis HTN, HFpEF EF 65% 2016  Admit with 3-second loss of consciousness 04/30/2021-found to be tachycardic-developed CODE BLUE in the ED and patient slumped over found to have NSVT--potassium on admission was noted to be 2.4. Cardiology consulted--patient was started on amiodarone drip-->tablets 1/15 and transferred out of stepdown 1/16  Patient initially improved but then developed some shortness of breath and increasing pleural effusions and as AKI resolved patient was placed on diuretics  Gastroenterology was consulted secondary to need for EGD given complex hernia repair previously-currently we have suspended EGD plans and patient will get a PEG tube for nutrition in the next several days  Hospital-Problem based course  V. fib arrest 2/2 hypokalemia status post CPR Paroxysmal A. fib Cardiology signed ff-no plans LifeVest ICD  continue amiodarone 400 twice daily until 1/24 and then transition to 200 daily Flector patch-use Oxy IR 2/2 Get am mag acute superimposed on chronic HFpEF EF 60-65% 1/19 with rales, now less short of breath  Lasix 40 mg po am, 20 mg pm is becoming net neg-diuresis, net -1.88 Avapro now @ 75 daily  Continue hydralazine 10 every 6 as needed Toxic metabolic encephalopathy on admission 2/2 Combination AKI + opiates Discontinue Dilaudid-continue cautious oxycodone for pain control-see below Dysphagia reflux hernia status postrepair 2021 Gastroenterology patient too high risk for EGD given cardiorespiratory status ?PEG tube placement in 24-48  hypervolemic hyponatremia-and  some component of SIADH serum osmolality 273-urine studies have improved slightly Discussed with Dr. Joylene Grapes of nephrology 1/20 Continue salt tablets 2 g twice daily--Sodium relatively stable if sodium drops below 125 , then urea 15 g twice daily Hypokalemia Replace with Kdur Normocytic anemia dilutional Monitor only RUE pain/swelling secondary to superficial phlebitis Elevate extremity--ice/heat, ultrasound 1/19 rules out DVT Resolved AKI-peak 3.99 creatinine Continue oral fluids at this time Urinary tract infection from pta  Received 3 days of ceftriaxone ending 1/15  DVT prophylaxis: Lovenox Code Status: Partial Family Communication: Long discussion with daughter --eventually may need OP Palliative discussions, but for now will continue current course Disposition: Discussed with patient's daughter and husband at the bedside Status is: Inpatient  Remains inpatient appropriate because: Not stable for discharge  Consultants:  Cardiology Gastroenterology  Procedures:  CPR  Antimicrobials: Rocephin   Subjective:  Awake coherent More interactive today--still has pain but is stable  Objective: Vitals:   05/09/21 2038 05/10/21 0500 05/10/21 0732 05/10/21 1423  BP: (!) 124/57   (!) 121/50  Pulse: 62   60  Resp:    18  Temp: 97.8 F (36.6 C)   98.2 F (36.8 C)  TempSrc: Oral   Oral  SpO2:   97% 100%  Weight:  65.6 kg    Height:        Intake/Output Summary (Last 24 hours) at 05/10/2021 1448 Last data filed at 05/10/2021 0700 Gross per 24 hour  Intake 60 ml  Output 600 ml  Net -540 ml    Filed Weights   05/07/21 0500 05/08/21 0500 05/10/21 0500  Weight: 67.8 kg 67.2 kg 65.6 kg    Examination:  Coherent no distress--On oxygen EOMI NCAT, no ict no pallor Neck soft, supple Cta b, lateral lung fields less  rhonchi Abd soft nt nd no rebound no guarding R UE  still swollen Power 5/5-, limited by weakness  Data Reviewed: personally reviewed   CBC     Component Value Date/Time   WBC 8.0 05/07/2021 0425   RBC 2.54 (L) 05/07/2021 0425   HGB 8.2 (L) 05/07/2021 0425   HGB 12.4 09/27/2016 1349   HCT 25.5 (L) 05/07/2021 0425   HCT 38.4 09/27/2016 1349   PLT 350 05/07/2021 0425   PLT 263 09/27/2016 1349   MCV 100.4 (H) 05/07/2021 0425   MCV 89.1 09/27/2016 1349   MCH 32.3 05/07/2021 0425   MCHC 32.2 05/07/2021 0425   RDW 14.5 05/07/2021 0425   RDW 17.3 (H) 09/27/2016 1349   LYMPHSABS 1.1 05/07/2021 0425   LYMPHSABS 1.5 09/27/2016 1349   MONOABS 0.7 05/07/2021 0425   MONOABS 0.7 09/27/2016 1349   EOSABS 0.2 05/07/2021 0425   EOSABS 0.1 09/27/2016 1349   BASOSABS 0.1 05/07/2021 0425   BASOSABS 0.0 09/27/2016 1349   CMP Latest Ref Rng & Units 05/10/2021 05/09/2021 05/08/2021  Glucose 70 - 99 mg/dL 104(H) 105(H) 115(H)  BUN 8 - 23 mg/dL 29(H) 28(H) 27(H)  Creatinine 0.44 - 1.00 mg/dL 1.06(H) 0.99 0.89  Sodium 135 - 145 mmol/L 129(L) 130(L) 128(L)  Potassium 3.5 - 5.1 mmol/L 3.3(L) 3.1(L) 3.5  Chloride 98 - 111 mmol/L 94(L) 93(L) 93(L)  CO2 22 - 32 mmol/L 26 28 28   Calcium 8.9 - 10.3 mg/dL 8.1(L) 8.1(L) 7.9(L)  Total Protein 6.5 - 8.1 g/dL 4.9(L) - -  Total Bilirubin 0.3 - 1.2 mg/dL 0.9 - -  Alkaline Phos 38 - 126 U/L 91 - -  AST 15 - 41 U/L 18 - -  ALT 0 - 44 U/L 15 - -     Radiology Studies: No results found.   Scheduled Meds:  [START ON 05/12/2021] amiodarone  200 mg Oral Daily   amiodarone  400 mg Oral BID   chlorhexidine  15 mL Mouth Rinse BID   diclofenac  1 patch Transdermal BID   enoxaparin (LOVENOX) injection  40 mg Subcutaneous Q24H   feeding supplement  1 Container Oral TID BM   feeding supplement  237 mL Oral BID BM   fluticasone furoate-vilanterol  1 puff Inhalation Daily   furosemide  20 mg Oral QPM   [START ON 05/11/2021] furosemide  40 mg Oral Daily   irbesartan  75 mg Oral Daily   mouth rinse  15 mL Mouth Rinse BID   mouth rinse  15 mL Mouth Rinse q12n4p   omeprazole  40 mg Oral BID   potassium  chloride  40 mEq Oral Daily   senna-docusate  2 tablet Oral BID   sodium chloride  1 g Oral BID WC   Continuous Infusions:    LOS: 10 days   Time spent: 23  Nita Sells, MD [facility] To contact the attending provider between 7A-7P or the covering provider during after hours 7P-7A, please log into the web site [email or web address] and access using universal [place] password for that web site. If you do not have the password, please call the hospital operator.  05/10/2021, 2:48 PM

## 2021-05-10 NOTE — TOC Progression Note (Signed)
Transition of Care [facility]) - Progression Note    Patient Details  Name: [name] MRN: [id number] Date of Birth: 1935/01/18  [facility] CM/SW Contact  Ross Ludwig, [place] Phone Number: 05/10/2021, 6:10 PM  Clinical Narrative:    Updated clinicals sent to SNFs patient will need insurance authorization and decision on SNF.   Expected Discharge Plan: Skilled Nursing Facility Barriers to Discharge: Continued Medical Work up  Expected Discharge Plan and Services Expected Discharge Plan: [place]                                               Social Determinants of Health (SDOH) Interventions    Readmission Risk Interventions No flowsheet data found.

## 2021-05-11 ENCOUNTER — Other Ambulatory Visit (HOSPITAL_COMMUNITY): Payer: Self-pay | Admitting: Physician Assistant

## 2021-05-11 ENCOUNTER — Inpatient Hospital Stay (HOSPITAL_COMMUNITY): Payer: Medicare PPO

## 2021-05-11 HISTORY — PX: IR GASTROSTOMY TUBE MOD SED: IMG625

## 2021-05-11 LAB — CBC
HCT: 21.3 % — ABNORMAL LOW (ref 36.0–46.0)
Hemoglobin: 7.2 g/dL — ABNORMAL LOW (ref 12.0–15.0)
MCH: 32.9 pg (ref 26.0–34.0)
MCHC: 33.8 g/dL (ref 30.0–36.0)
MCV: 97.3 fL (ref 80.0–100.0)
Platelets: 316 10*3/uL (ref 150–400)
RBC: 2.19 MIL/uL — ABNORMAL LOW (ref 3.87–5.11)
RDW: 15.1 % (ref 11.5–15.5)
WBC: 5.8 10*3/uL (ref 4.0–10.5)
nRBC: 0 % (ref 0.0–0.2)

## 2021-05-11 LAB — COMPREHENSIVE METABOLIC PANEL
ALT: 15 U/L (ref 0–44)
AST: 17 U/L (ref 15–41)
Albumin: 2.4 g/dL — ABNORMAL LOW (ref 3.5–5.0)
Alkaline Phosphatase: 91 U/L (ref 38–126)
Anion gap: 7 (ref 5–15)
BUN: 30 mg/dL — ABNORMAL HIGH (ref 8–23)
CO2: 28 mmol/L (ref 22–32)
Calcium: 8 mg/dL — ABNORMAL LOW (ref 8.9–10.3)
Chloride: 95 mmol/L — ABNORMAL LOW (ref 98–111)
Creatinine, Ser: 1.13 mg/dL — ABNORMAL HIGH (ref 0.44–1.00)
GFR, Estimated: 48 mL/min — ABNORMAL LOW (ref >60)
Glucose, Bld: 95 mg/dL (ref 70–99)
Potassium: 3.4 mmol/L — ABNORMAL LOW (ref 3.5–5.1)
Sodium: 130 mmol/L — ABNORMAL LOW (ref 135–145)
Total Bilirubin: 1 mg/dL (ref 0.3–1.2)
Total Protein: 5 g/dL — ABNORMAL LOW (ref 6.5–8.1)

## 2021-05-11 LAB — GLUCOSE, CAPILLARY
Glucose-Capillary: 94 mg/dL (ref 70–99)
Glucose-Capillary: 92 mg/dL (ref 70–99)

## 2021-05-11 LAB — PROTIME-INR
INR: 1.1 (ref 0.8–1.2)
Prothrombin Time: 13.8 seconds (ref 11.4–15.2)

## 2021-05-11 LAB — MAGNESIUM: Magnesium: 1.7 mg/dL (ref 1.7–2.4)

## 2021-05-11 MED ORDER — IOHEXOL 300 MG/ML  SOLN: 50.0000 mL | Freq: Once | INTRAMUSCULAR | Status: DC | PRN

## 2021-05-11 MED ORDER — MIDAZOLAM HCL 2 MG/2ML IJ SOLN
INTRAMUSCULAR | Status: AC | PRN
  Administered 2021-05-11: .5 mg via INTRAVENOUS

## 2021-05-11 MED ORDER — CEFAZOLIN SODIUM-DEXTROSE 2-4 GM/100ML-% IV SOLN
INTRAVENOUS | Status: AC
  Filled 2021-05-11: qty 100

## 2021-05-11 MED ORDER — CEFAZOLIN SODIUM-DEXTROSE 1-4 GM/50ML-% IV SOLN
INTRAVENOUS | Status: AC | PRN
  Administered 2021-05-11: 2 g via INTRAVENOUS

## 2021-05-11 MED ORDER — LIDOCAINE HCL (PF) 1 % IJ SOLN
INTRAMUSCULAR | Status: AC | PRN
  Administered 2021-05-11: 10 mL via INTRADERMAL

## 2021-05-11 MED ORDER — DEXTROSE 5 % IV SOLN: INTRAVENOUS | Status: DC

## 2021-05-11 MED ORDER — MORPHINE SULFATE (PF) 2 MG/ML IV SOLN
1.0000 mg | INTRAVENOUS | Status: DC | PRN
  Administered 2021-05-11 – 2021-05-12 (×3): 1 mg via INTRAVENOUS
  Filled 2021-05-11 (×3): qty 1

## 2021-05-11 MED ORDER — CEFAZOLIN SODIUM-DEXTROSE 1-4 GM/50ML-% IV SOLN: INTRAVENOUS | Status: AC | PRN

## 2021-05-11 MED ORDER — FENTANYL CITRATE (PF) 100 MCG/2ML IJ SOLN
INTRAMUSCULAR | Status: AC | PRN
  Administered 2021-05-11: 25 ug via INTRAVENOUS

## 2021-05-11 MED ORDER — AMIODARONE IV BOLUS ONLY 150 MG/100ML
150.0000 mg | Freq: Once | INTRAVENOUS | Status: AC
  Administered 2021-05-11: 150 mg via INTRAVENOUS
  Filled 2021-05-11: qty 100

## 2021-05-11 MED ORDER — LIDOCAINE HCL 1 % IJ SOLN
INTRAMUSCULAR | Status: AC
  Filled 2021-05-11: qty 20

## 2021-05-11 MED ORDER — MIDAZOLAM HCL 2 MG/2ML IJ SOLN
INTRAMUSCULAR | Status: AC
  Filled 2021-05-11: qty 2

## 2021-05-11 MED ORDER — FENTANYL CITRATE (PF) 100 MCG/2ML IJ SOLN
INTRAMUSCULAR | Status: AC
  Filled 2021-05-11: qty 2

## 2021-05-11 MED ORDER — LIDOCAINE VISCOUS HCL 2 % MT SOLN
OROMUCOSAL | Status: AC
  Filled 2021-05-11: qty 15

## 2021-05-11 MED ORDER — LIDOCAINE VISCOUS HCL 2 % MT SOLN
OROMUCOSAL | Status: AC | PRN
  Administered 2021-05-11: 5 mL via OROMUCOSAL

## 2021-05-11 MED ORDER — BACITRACIN-NEOMYCIN-POLYMYXIN 400-5-5000 EX OINT
1.0000 "application " | TOPICAL_OINTMENT | Freq: Every day | CUTANEOUS | Status: DC
  Administered 2021-05-12 – 2021-05-15 (×4): 1 via TOPICAL

## 2021-05-11 NOTE — Progress Notes (Signed)
PROGRESS NOTE   [name]  SHF:[id number] DOB: January 31, 1935 DOA: 04/30/2021 PCP: Aretta Nip, MD  Brief Narrative:   87 year old white female Prior large paraesophageal hernia with organoaxial volvulus status postrepair 04/28/2020 Dr. Kipp Brood Chronic respiratory failure on 3 L oxygen Prior pulmonary embolism no anticoagulation Prior cholecystectomy Previous transaminitis?  2/2 sepsis HTN, HFpEF EF 65% 2016  Admit with 3-second loss of consciousness 04/30/2021-found to be tachycardic-developed CODE BLUE in the ED and patient slumped over found to have NSVT--potassium on admission was noted to be 2.4. Cardiology consulted--patient was started on amiodarone drip-->tablets 1/15 and transferred out of stepdown 1/16  Patient initially improved but then developed some shortness of breath and increasing pleural effusions and as AKI resolved patient was placed on diuretics  Gastroenterology was consulted secondary to need for EGD given complex hernia repair previously-currently we have suspended EGD plans PEG placed by IR 1/23 but she is n.p.o. and needs meds crushed therefore she will need to be resumed on all of her home meds and other meds tomorrow  Hospital-Problem based course  Dysphagia reflux hernia status postrepair 2021 PEG tube placed by IR 1/23 Gastroenterology patient too high risk for EGD given cardiorespiratory status Cannot use until tomorrow and is n.p.o. and has to have meds crushed--med adjustments made today V. fib arrest 2/2 hypokalemia status post CPR Paroxysmal A. fib Cardiology signed ff-no plans LifeVest ICD  amio given as IV today---resume 200 daily tab when able to take p.o. Flector patch Magnesium is okay Vertigo? Screening yesterday 1/22 she has had symptoms suggestive of BPPV I will ask physical therapy to see her for habituation exercises acute superimposed on chronic HFpEF EF 60-65% 1/19 with rales, now less short of breath  Lasix 40 mg po am,  20 mg pm is becoming net neg-diuresis, net -2.05 Avapro now @ 75 daily  Continue hydralazine 10 every 6 as needed Toxic metabolic encephalopathy on admission 2/2 Combination AKI + opiates As is n.p.o. we will use morphine 1 mg q4prn low-dose temporarily--can resume oxycodone 5 every 6 as needed hypervolemic hyponatremia-and some component of SIADH serum osmolality 273-urine studies have improved slightly Discussed with Dr. Joylene Grapes of nephrology 1/20 salt tablets 2 g twice daily when able--Sodium relatively stable if sodium drops below 125 , then urea 15 g twice daily Hypokalemia Replace with Kdur-improved Normocytic anemia dilutional Monitor only--transfusion trigger below 7.0 Get Hemoccult Stool RUE pain/swelling secondary to superficial phlebitis Elevate extremity--ice/heat, ultrasound 1/19 rules out DVT Resolved AKI-peak 3.99 creatinine Recheck labs am Would hold IVf today [just resolving vol overload]  Urinary tract infection from pta  Received 3 days of ceftriaxone ending 1/15  DVT prophylaxis: Lovenox Code Status: Partial Family Communication: may need OP Palliative discussions, I have discussed again with daughter and patient's husband at the bedside-I expect she will improve and be able to discharge sometime later this Disposition: Discussed with patient's daughter and husband at the bedside Status is: Inpatient  Remains inpatient appropriate because: Not stable for discharge  Consultants:  Cardiology Gastroenterology  Procedures:  CPR  Antimicrobials: Rocephin   Subjective:  Seen after PEG tube placed-and some pain Giving morphine She appears a little dry but would hesitate to give IV fluids at this time The swelling in her right arm is less painful She still complains of some chest pain  Objective: Vitals:   05/11/21 1258 05/11/21 1259 05/11/21 1320 05/11/21 1400  BP:   (!) 139/53 (!) 155/55  Pulse: 60 60 62 (!) 59  Resp: 18  17 16 (!) 24  Temp:   97.8  F (36.6 C) (!) 97.3 F (36.3 C)  TempSrc:   Oral Oral  SpO2: 100% 100% 98% 100%  Weight:      Height:        Intake/Output Summary (Last 24 hours) at 05/11/2021 1414 Last data filed at 05/11/2021 0430 Gross per 24 hour  Intake --  Output 250 ml  Net -250 ml    Filed Weights   05/08/21 0500 05/10/21 0500 05/11/21 0500  Weight: 67.2 kg 65.6 kg 68.6 kg    Examination:  Coherent no distress--On oxygen and some pain EOMI eyes predominantly closed most of the time Neck soft, supple mucosas dry Cta b, lateral lung fields air entry slightly decreased Abd soft nt --PEG tube in place-dressing over R UE  still swollen but improving Power 5/5-, limited by weakness  Data Reviewed: personally reviewed   CBC    Component Value Date/Time   WBC 5.8 05/11/2021 0441   RBC 2.19 (L) 05/11/2021 0441   HGB 7.2 (L) 05/11/2021 0441   HGB 12.4 09/27/2016 1349   HCT 21.3 (L) 05/11/2021 0441   HCT 38.4 09/27/2016 1349   PLT 316 05/11/2021 0441   PLT 263 09/27/2016 1349   MCV 97.3 05/11/2021 0441   MCV 89.1 09/27/2016 1349   MCH 32.9 05/11/2021 0441   MCHC 33.8 05/11/2021 0441   RDW 15.1 05/11/2021 0441   RDW 17.3 (H) 09/27/2016 1349   LYMPHSABS 1.1 05/07/2021 0425   LYMPHSABS 1.5 09/27/2016 1349   MONOABS 0.7 05/07/2021 0425   MONOABS 0.7 09/27/2016 1349   EOSABS 0.2 05/07/2021 0425   EOSABS 0.1 09/27/2016 1349   BASOSABS 0.1 05/07/2021 0425   BASOSABS 0.0 09/27/2016 1349   CMP Latest Ref Rng & Units 05/11/2021 05/10/2021 05/09/2021  Glucose 70 - 99 mg/dL 95 104(H) 105(H)  BUN 8 - 23 mg/dL 30(H) 29(H) 28(H)  Creatinine 0.44 - 1.00 mg/dL 1.13(H) 1.06(H) 0.99  Sodium 135 - 145 mmol/L 130(L) 129(L) 130(L)  Potassium 3.5 - 5.1 mmol/L 3.4(L) 3.3(L) 3.1(L)  Chloride 98 - 111 mmol/L 95(L) 94(L) 93(L)  CO2 22 - 32 mmol/L 28 26 28   Calcium 8.9 - 10.3 mg/dL 8.0(L) 8.1(L) 8.1(L)  Total Protein 6.5 - 8.1 g/dL 5.0(L) 4.9(L) -  Total Bilirubin 0.3 - 1.2 mg/dL 1.0 0.9 -  Alkaline Phos 38 -  126 U/L 91 91 -  AST 15 - 41 U/L 17 18 -  ALT 0 - 44 U/L 15 15 -     Radiology Studies: IR GASTROSTOMY TUBE MOD SED  Result Date: 05/11/2021 INDICATION: 87 year old female referred for percutaneous gastrostomy EXAM: PERC PLACEMENT GASTROSTOMY MEDICATIONS: 2 g Ancef; Antibiotics were administered within 1 hour of the procedure. ANESTHESIA/SEDATION: Versed 1.0 mg IV; Fentanyl 25 mcg IV Moderate Sedation Time:  13 minutes The patient was continuously monitored during the procedure by the interventional radiology nurse under my direct supervision. CONTRAST:  10 cc-administered into the gastric lumen. FLUOROSCOPY TIME:  Fluoroscopy Time: 5 minutes 12 seconds (32 mGy). COMPLICATIONS: None PROCEDURE: Informed written consent was obtained from the patient and the patient's family after a thorough discussion of the procedural risks, benefits and alternatives. All questions were addressed. Maximal Sterile Barrier Technique was utilized including caps, mask, sterile gowns, sterile gloves, sterile drape, hand hygiene and skin antiseptic. A timeout was performed prior to the initiation of the procedure. The epigastrium was prepped with Betadine in a sterile fashion, and a sterile drape was applied covering the  operative field. A sterile gown and sterile gloves were used for the procedure. A 5-French orogastric tube is placed under fluoroscopic guidance. Scout imaging of the abdomen confirms barium within the transverse colon. The stomach was distended with gas. Under fluoroscopic guidance, an 18 gauge needle was utilized to puncture the anterior wall of the body of the stomach. An Amplatz wire was advanced through the needle passing a T fastener into the lumen of the stomach. The T fastener was secured for gastropexy. A 9-French sheath was inserted. A snare was advanced through the 9-French sheath. A Britta Mccreedy was advanced through the orogastric tube. It was snared then pulled out the oral cavity, pulling the snare, as  well. The leading edge of the gastrostomy was attached to the snare. It was then pulled down the esophagus and out the percutaneous site. Tube secured in place. Contrast was injected. Patient tolerated the procedure well and remained hemodynamically stable throughout. No complications were encountered and no significant blood loss encountered. IMPRESSION: Status post fluoroscopic placed percutaneous gastrostomy tube, with 20 [place] pull-through. Signed, Dulcy Fanny. Earleen Newport, DO [facility] Radiology Electronically Signed   By: Corrie Mckusick D.O.   On: 05/11/2021 13:16     Scheduled Meds:  [START ON 05/12/2021] amiodarone  200 mg Oral Daily   chlorhexidine  15 mL Mouth Rinse BID   diclofenac  1 patch Transdermal BID   enoxaparin (LOVENOX) injection  40 mg Subcutaneous Q24H   feeding supplement  1 Container Oral TID BM   feeding supplement  237 mL Oral BID BM   fluticasone furoate-vilanterol  1 puff Inhalation Daily   lidocaine       mouth rinse  15 mL Mouth Rinse BID   mouth rinse  15 mL Mouth Rinse q12n4p   neomycin-bacitracin-polymyxin  1 application Topical Daily   potassium chloride  40 mEq Oral Daily   senna-docusate  2 tablet Oral BID   sodium chloride  1 g Oral BID WC   Continuous Infusions:  ceFAZolin        LOS: 11 days   Time spent: [place], MD [facility] To contact the attending provider between 7A-7P or the covering provider during after hours 7P-7A, please log into the web site [email or web address] and access using universal [place] password for that web site. If you do not have the password, please call the hospital operator.  05/11/2021, 2:14 PM

## 2021-05-11 NOTE — Procedures (Signed)
Interventional Radiology Procedure Note  Procedure: Placement of percutaneous 20F pull-through gastrostomy tube. Complications: None Recommendations: - NPO except for sips and chips remainder of today and overnight - Maintain G-tube to LWS until tomorrow morning  - May advance diet as tolerated and begin using tube tomorrow morning  Signed,   Korbin Mapps S. Koryn Charlot, DO   

## 2021-05-11 NOTE — TOC Progression Note (Signed)
Transition of Care [facility]) - Progression Note    Patient Details  Name: [name] MRN: [id number] Date of Birth: [birth year]  Transition of Care [facility]) CM/SW Contact  Brenton Joines, Juliann Pulse, RN Phone Number: 05/11/2021, 3:34 PM  Clinical Narrative: Family chose [facility] for bed availability @ d/c-auth has been initiated-auth completion can be done @ SNF since auths are waived currently.Await medical stability.     Expected Discharge Plan: Skilled Nursing Facility Barriers to Discharge: Continued Medical Work up  Expected Discharge Plan and Services Expected Discharge Plan: [place]                                               Social Determinants of Health (SDOH) Interventions    Readmission Risk Interventions No flowsheet data found.

## 2021-05-11 NOTE — Care Management Important Message (Signed)
Important Message  Patient Details IM Letter placed in Patients room. Name: [name] MRN: [id number] Date of Birth: 24-Jun-1934   Medicare Important Message Given:  Yes     Kerin Salen 05/11/2021, 1:37 PM

## 2021-05-11 NOTE — Progress Notes (Signed)
Patient back from IR post PEG tube placement, patient alert in the no distress. PEG connected to LWS per order, PEG dressing clean/dry/intact. Vitals WNL, will continue to assess patient, Family at the bedside.

## 2021-05-12 DIAGNOSIS — R42 Dizziness and giddiness: Secondary | ICD-10-CM

## 2021-05-12 DIAGNOSIS — I4901 Ventricular fibrillation: Secondary | ICD-10-CM

## 2021-05-12 LAB — GLUCOSE, CAPILLARY
Glucose-Capillary: 110 mg/dL — ABNORMAL HIGH (ref 70–99)
Glucose-Capillary: 106 mg/dL — ABNORMAL HIGH (ref 70–99)

## 2021-05-12 LAB — RENAL FUNCTION PANEL
Albumin: 2.3 g/dL — ABNORMAL LOW (ref 3.5–5.0)
Anion gap: 6 (ref 5–15)
BUN: 30 mg/dL — ABNORMAL HIGH (ref 8–23)
CO2: 29 mmol/L (ref 22–32)
Calcium: 8.2 mg/dL — ABNORMAL LOW (ref 8.9–10.3)
Chloride: 95 mmol/L — ABNORMAL LOW (ref 98–111)
Creatinine, Ser: 1.01 mg/dL — ABNORMAL HIGH (ref 0.44–1.00)
GFR, Estimated: 55 mL/min — ABNORMAL LOW (ref >60)
Glucose, Bld: 110 mg/dL — ABNORMAL HIGH (ref 70–99)
Phosphorus: 3.7 mg/dL (ref 2.5–4.6)
Potassium: 3.2 mmol/L — ABNORMAL LOW (ref 3.5–5.1)
Sodium: 130 mmol/L — ABNORMAL LOW (ref 135–145)

## 2021-05-12 MED ORDER — POTASSIUM CHLORIDE 20 MEQ PO PACK: 40.0000 meq | PACK | Freq: Two times a day (BID) | ORAL | Status: DC

## 2021-05-12 MED ORDER — HYDROMORPHONE HCL 1 MG/ML IJ SOLN
0.5000 mg | INTRAMUSCULAR | Status: DC | PRN
  Filled 2021-05-12: qty 0.5

## 2021-05-12 MED ORDER — POTASSIUM CHLORIDE 20 MEQ PO PACK
40.0000 meq | PACK | Freq: Three times a day (TID) | ORAL | Status: AC
  Administered 2021-05-12 (×3): 40 meq
  Filled 2021-05-12 (×4): qty 2

## 2021-05-12 MED ORDER — OSMOLITE 1.2 CAL PO LIQD
1000.0000 mL | ORAL | Status: DC
  Administered 2021-05-12: 19:00:00 1000 mL
  Filled 2021-05-12: qty 1000

## 2021-05-12 MED ORDER — POTASSIUM CHLORIDE 20 MEQ PO PACK
40.0000 meq | PACK | Freq: Every day | ORAL | Status: DC
  Administered 2021-05-13: 11:00:00 40 meq
  Filled 2021-05-12: qty 2

## 2021-05-12 MED ORDER — MAGNESIUM SULFATE 2 GM/50ML IV SOLN
2.0000 g | Freq: Once | INTRAVENOUS | Status: AC
  Administered 2021-05-12: 10:00:00 2 g via INTRAVENOUS
  Filled 2021-05-12: qty 50

## 2021-05-12 NOTE — Assessment & Plan Note (Signed)
With severe nausea.  Given her QT prolongation, we are severely limited in management of her nausea.  She is also very sleepy, so I am cautious about benzodiazepines. - Low-dose Ativan as needed for nausea

## 2021-05-12 NOTE — Assessment & Plan Note (Addendum)
Initial presenting complaint, had torsades cardiac arrest in the waiting room of the ER, received CPR for less than 6 minutes, actually no defibrillation, and then ROSC and has been cognitively intact -Discharged with [facility] with cardiology follow-up - Keep magnesium greater than 2 and potassium greater than 4  -Continue amiodarone

## 2021-05-12 NOTE — Assessment & Plan Note (Signed)
Continue Breo 

## 2021-05-12 NOTE — Progress Notes (Signed)
Patient ID: [name], female   DOB: [birth year], 87 y.o.   MRN: [id number] Pt s/p perc G tube placement yesterday; family in room; AF;VSS; G tube intact, insertion site ok, mildly tender, no leaking noted; abd soft; ok to use tube as needed

## 2021-05-12 NOTE — Progress Notes (Signed)
Physical Therapy Treatment Patient Details Name: [name] MRN: [id number] DOB: [birth year] Today's Date: 05/12/2021   History of Present Illness 87 year old F with PMH of hiatal hernia status post repair, dysphagia, GERD, CHF, PE not on AC, chronic hypoxic RF on 2 L at bedtime, cognitive impairment and venous insufficiency brought to ED by EMS after an episode of 3-second LOC after coughing spell followed by generalized weakness. Admitted for syncope and collapse. While in ED, CODE BLUE called when patient became pale and slumped over but no LOC.  She had brief CPR.  No VT, VF or asystole on monitor.  She later developed A. fib with RVR and had 20 beats of nonsustained VT.  PEG tube placed by IR 1/23    PT Comments    Pt very ill appearing in bed.  Pt attempted to participate however extremely limited by weakness, nausea and dizziness.  Attempted vestibular exam however pt very poor historian and does not describe dizzy well at this time.  Pt does endorse increased dizziness with movement and mobility however also has not been very mobile during admission. Difficult to distinguish if dizziness has vertigo component.  Family in room and explained testing and also difficulties to family who appear to understand (pt has many medical issues).  Recommend Palliative consult.   05/12/21 1119  Vestibular Assessment  General Observation Pt very ill appearing laying in bed.  Pt reports dizziness at times prior to admission and also ringing in ears however daughter and spouse both unaware of ringing in ears.  Pt does have a hearing impairment with one ear however does not wear hearing aide despite having hearing aid.  Symptom Behavior  Type of Dizziness  Lightheadedness;"World moves";Comment (weak")  Frequency of Dizziness states with movement however also saying she is dizzy at rest with eyes closed  Duration of Dizziness reports short duration, does not last an hour  Symptom Nature Motion  provoked;Positional  Aggravating Factors Moving eyes;Turning head quickly;Supine to sit;Rolling to right;Rolling to left;Activity in general  Relieving Factors Closing eyes;Rest  Progression of Symptoms Worse  History of similar episodes reports being "dizzy" prior to admission for years?  Oculomotor Exam  Oculomotor Alignment Normal  Ocular ROM diffulty focusing on finger  Spontaneous Absent  Gaze-induced  Age appropriate nystagmus at end range  Smooth Pursuits Saccades  Comment Pt not tolerating oculomotor exam well and keeps eyes half closed.      Recommendations for follow up therapy are one component of a multi-disciplinary discharge planning process, led by the attending physician.  Recommendations may be updated based on patient status, additional functional criteria and insurance authorization.  Follow Up Recommendations  Skilled nursing-short term rehab (<3 hours/day)     Assistance Recommended at Discharge Frequent or constant Supervision/Assistance  Patient can return home with the following Two people to help with walking and/or transfers;A lot of help with bathing/dressing/bathroom;Assistance with cooking/housework;Direct supervision/assist for medications management;Assist for transportation;Assistance with feeding;Direct supervision/assist for financial management;Help with stairs or ramp for entrance   Equipment Recommendations  None recommended by PT    Recommendations for Other Services       Precautions / Restrictions Precautions Precautions: Fall Precaution Comments: sternum and across to right chest wall painful, PEG Restrictions Weight Bearing Restrictions: No     Mobility  Bed Mobility Overal bed mobility: Needs Assistance Bed Mobility: Rolling, Sidelying to Sit, Sit to Sidelying Rolling: Max assist, Total assist, +2 for physical assistance Sidelying to sit: Total assist, +2 for physical assistance  Sit to sidelying: Total assist, +2 for physical  assistance General bed mobility comments: pt requiring increased assist due to generalized weakness, deconditioning, fatigue, pain and dizziness; pt poor historian when asked questions about dizziness; unable to maintain upright posture long due to "dizziness" "weak"    Transfers                        Ambulation/Gait                   Stairs             Wheelchair Mobility    Modified Rankin (Stroke Patients Only)       Balance Overall balance assessment: Needs assistance Sitting-balance support: Feet supported Sitting balance-Leahy Scale: Zero Sitting balance - Comments: pt not assisting with UE, requiring trunk support                                    Cognition Arousal/Alertness: Awake/alert Behavior During Therapy: Flat affect Overall Cognitive Status: Within Functional Limits for tasks assessed                                 General Comments: required constant cues to keep eyes open (tired but also reports feeling dizzy and eyes closed helps)        Exercises      General Comments        Pertinent Vitals/Pain Pain Assessment Pain Assessment: Faces Faces Pain Scale: Hurts even more Pain Location: sternum, PEG location Pain Descriptors / Indicators: Grimacing, Moaning, Guarding Pain Intervention(s): Monitored during session, Repositioned    Home Living                          Prior Function            PT Goals (current goals can now be found in the care plan section) Progress towards PT goals: Not progressing toward goals - comment (medically complex, dizzy, weak)    Frequency    Min 2X/week      PT Plan Current plan remains appropriate    Co-evaluation              AM-PAC PT "6 Clicks" Mobility   Outcome Measure  Help needed turning from your back to your side while in a flat bed without using bedrails?: Total Help needed moving from lying on your back to sitting  on the side of a flat bed without using bedrails?: Total Help needed moving to and from a bed to a chair (including a wheelchair)?: Total Help needed standing up from a chair using your arms (e.g., wheelchair or bedside chair)?: Total Help needed to walk in hospital room?: Total Help needed climbing 3-5 steps with a railing? : Total 6 Click Score: 6    End of Session Equipment Utilized During Treatment: Oxygen Activity Tolerance: Patient limited by fatigue;Patient limited by pain Patient left: in bed;with call bell/phone within reach;with family/visitor present Nurse Communication: Mobility status PT Visit Diagnosis: Unsteadiness on feet (R26.81);Difficulty in walking, not elsewhere classified (R26.2)     Time: 1610-9604 PT Time Calculation (min) (ACUTE ONLY): 37 min  Charges:  $Therapeutic Activity: 8-22 mins $Physical Performance Test: 8-22 mins  Jannette Spanner PT, DPT Acute Rehabilitation Services Pager: [phone] Office: [place] 05/12/2021, 1:20 PM

## 2021-05-12 NOTE — Progress Notes (Signed)
Occupational Therapy Treatment Patient Details Name: [name] MRN: [id number] DOB: 07-02-1934 Today's Date: 05/12/2021   History of present illness 87 year old F with PMH of hiatal hernia status post repair, dysphagia, GERD, CHF, PE not on AC, chronic hypoxic RF on 2 L at bedtime, cognitive impairment and venous insufficiency brought to ED by EMS after an episode of 3-second LOC after coughing spell followed by generalized weakness. Admitted for syncope and collapse. While in ED, CODE BLUE called when patient became pale and slumped over but no LOC.  She had brief CPR.  No VT, VF or asystole on monitor.  She later developed A. fib with RVR and had 20 beats of nonsustained VT.  PEG tube placed by IR 1/23   OT comments  Patient was noted to be limited by increased nausea and pain with attempts at chair position in bed on this date. Patients daughter was present and educated on proper positioning with pillows to reduce edema and importance of moving BUE to maintain ROM. Patient's daughter verbalized understanding. Patient unable to tolerate HOB raised past 45 degrees at this time with pain medication on board. Nurse educated on positioning for patient as well. Nurse verbalized understanding. Patient would continue to benefit from skilled OT services at this time while admitted and after d/c to address noted deficits in order to improve overall safety and independence in ADLs.     Recommendations for follow up therapy are one component of a multi-disciplinary discharge planning process, led by the attending physician.  Recommendations may be updated based on patient status, additional functional criteria and insurance authorization.    Follow Up Recommendations  Skilled nursing-short term rehab (<3 hours/day)    Assistance Recommended at Discharge Frequent or constant Supervision/Assistance  Patient can return home with the following  A lot of help with bathing/dressing/bathroom;Assistance with  cooking/housework;Two people to help with walking and/or transfers   Equipment Recommendations  Other (comment) (defer to next venue)    Recommendations for Other Services      Precautions / Restrictions Precautions Precautions: Fall Precaution Comments: sternum and across to right chest wall painful, PEG Restrictions Weight Bearing Restrictions: No       Mobility Bed Mobility                    Transfers                         Balance                                           ADL either performed or assessed with clinical judgement   ADL Overall ADL's : Needs assistance/impaired                                       General ADL Comments: patient noted to have increased dizziness and nausea with sitting in chair position in bed. patient unable to tolerate sitting up past 45 degrees with increased nausea noted.  patient and daughter were educated on importance of keeping RUE elevated to reduce edema and patients daughter verbalized understanding. patient's daughter was also educated on importance of participation in ROM of BUE at bed level. patient's daughter verbalized understanding.    Extremity/Trunk Assessment Upper Extremity Assessment RUE Deficits /  Details: patient has swollen RUE with patient demonstrating ability to continue to use yonker in this UE            Vision       Perception     Praxis      Cognition Arousal/Alertness: Awake/alert Behavior During Therapy: Flat affect Overall Cognitive Status: Within Functional Limits for tasks assessed                                 General Comments: required constant cues to keep eyes open (tired but also reports feeling dizzy and eyes closed helps)        Exercises      Shoulder Instructions       General Comments      Pertinent Vitals/ Pain       Pain Assessment Pain Assessment: Faces Faces Pain Scale: Hurts even more Pain  Location: sternum, PEG location Pain Descriptors / Indicators: Grimacing, Moaning, Guarding Pain Intervention(s): Monitored during session, Premedicated before session  Home Living                                          Prior Functioning/Environment              Frequency  Min 2X/week        Progress Toward Goals  OT Goals(current goals can now be found in the care plan section)  Progress towards OT goals: Not progressing toward goals - comment (increased pain and nausea impacting participation)     Plan Discharge plan remains appropriate    Co-evaluation                 AM-PAC OT "6 Clicks" Daily Activity     Outcome Measure   Help from another person eating meals?: A Little Help from another person taking care of personal grooming?: A Little Help from another person toileting, which includes using toliet, bedpan, or urinal?: Total Help from another person bathing (including washing, rinsing, drying)?: A Lot Help from another person to put on and taking off regular upper body clothing?: A Lot Help from another person to put on and taking off regular lower body clothing?: Total 6 Click Score: 12    End of Session    OT Visit Diagnosis: Muscle weakness (generalized) (M62.81);Pain   Activity Tolerance Patient limited by pain;Patient limited by fatigue   Patient Left in bed;with call bell/phone within reach;with bed alarm set;with family/visitor present   Nurse Communication Mobility status        Time: 2778-2423 OT Time Calculation (min): 27 min  Charges: OT General Charges $OT Visit: 1 Visit OT Treatments $Therapeutic Activity: 23-37 mins  Jackelyn Poling OTR/L, [place] Acute Rehabilitation Department Office# [phone] Pager# [phone]   Marcellina Millin 05/12/2021, 1:22 PM

## 2021-05-12 NOTE — Hospital Course (Addendum)
[name] is an 87 y.o. F with hiatal hernia, s/p repair 1 yr ago, c/b chronic post-op nausea, vomiting and anorexia, hx PE not on AC, HTN, dCHF, and asthma not on home O2 (follows with Dr. Valeta Harms) who presented with syncope.  In the ER, had a CODE BLUE, found to have VF/torsades and received CPR --> K 2.4   1/12: Admitted, cardiology consulted, treated with amiodarone gtt 1/16: transferred out of ICU 1/17 GI consulted for distal esophageal narrowing, ?PEG placement 1/23: PEG placed by IR 1/24-1/26: Severe nausea, palliative consulted

## 2021-05-12 NOTE — Progress Notes (Signed)
°  Progress Note   Patient: [name] FVW:[id number] DOB: May 08, 1934 DOA: 04/30/2021     12 DOS: the patient was seen and examined on 05/12/2021   Brief hospital course: [name] is an 87 y.o. F with hiatal hernia, s/p repair 1 yr ago, hx PE not on AC, HTN, dCHF, and chornic respiraotry failure on 3L home O2 who presented with syncope.  In the ER, had a CODE BLUE, found to have VF/torsades and received CPR, and admission K 2.4   1/12: Admitted, cardiology consulted, treated with amiodarone gtt 1/16: transferred out of unit 1/17 GI consulted for distal esophageal narrowing, ?PEG placement 1/23: PEG placed by IR   Assessment and Plan Dysphagia PEG placed yesterday, IR has okayed for this to be used today. - Start tube feeds gradually  Ventricular fibrillation ([name]) Initial presenting complaint, had torsades cardiac arrest in the waiting room of the ER, received CPR for less than 6 minutes, actually no defibrillation, and then ROSC and has been cognitively intact -Discharged with [name] with cardiology follow-up - Keep magnesium greater than 2 and potassium greater than 4  -Continue amiodarone  Vertigo With severe nausea.  Given her QT prolongation, we are severely limited in management of her nausea.  She is also very sleepy, so I am cautious about benzodiazepines. - Low-dose Ativan as needed for nausea  Chronic respiratory failure ([name])- (present on admission) Continue Breo     Subjective: Patient with severe spinning and nausea and dizziness sensation.  Still has chest pain, improved somewhat with IV opiates.  Has a lot of coughing up mucus.  No confusion, fever.  Objective Vital signs reviewed and remarkable for slight high blood pressure, on 2 L oxygen Elderly adult female, appears debilitated, lying in bed, very weak.  Attention diminished but oriented to person, place, and time.  Heart rate regular, no murmurs, no lower extremity edema, she has right  arm edema.  Respiratory rate slow but normal.  Data Reviewed: Labs and imaging is notable for creatinine normal, potassium down to 3.2, mag 1.7, sodium 130  Family Communication: Daughter and husband at the bedside  Disposition: Status is: Inpatient  Remains inpatient appropriate because: still too weak to work with Physical therapy yet  I hope as her vertigo improves over the next few days, she will be able to work with PT and discharge to SNF within the next 4 to 5 days.           Author: Edwin Dada, MD 05/12/2021 6:46 PM  For on call review [email or web address].

## 2021-05-12 NOTE — Progress Notes (Signed)
Brief Nutrition Note  Consult received for enteral/tube feeding initiation and management.  PEG placed 1/23. Ready to use now per MD.  Adult Enteral Nutrition Protocol initiated. Full assessment to follow.  Admitting Dx: Syncope and collapse [R55] Ventricular tachycardia [I47.20] Weight loss [R63.4] Atrial fibrillation with RVR (HCC) [I48.91] Urinary tract infection without hematuria, site unspecified [N39.0] Gastroesophageal reflux disease, unspecified whether esophagitis present [K21.9]  Body mass index is 27.78 kg/m. Pt meets criteria for overweight based on current BMI.  Labs:  Recent Labs  Lab 05/06/21 0429 05/07/21 0425 05/08/21 0415 05/09/21 0437 05/10/21 0430 05/11/21 0441 05/12/21 0452  NA 129* 128* 128*   < > 129* 130* 130*  K 4.3 3.8 3.5   < > 3.3* 3.4* 3.2*  CL 94* 92* 93*   < > 94* 95* 95*  CO2 30 27 28    < > 26 28 29   BUN 31* 29* 27*   < > 29* 30* 30*  CREATININE 1.06* 0.78 0.89   < > 1.06* 1.13* 1.01*  CALCIUM 8.2* 8.2* 7.9*   < > 8.1* 8.0* 8.2*  MG 1.9 1.8  --   --   --  1.7  --   PHOS 2.7  --  3.9  --   --   --  3.7  GLUCOSE 143* 122* 115*   < > 104* 95 110*   < > = values in this interval not displayed.    Clayton Bibles, MS, RD, LDN Inpatient Clinical Dietitian Contact information available via Am[employer]

## 2021-05-12 NOTE — Assessment & Plan Note (Signed)
PEG placed yesterday, IR has okayed for this to be used today. - Start tube feeds gradually

## 2021-05-13 DIAGNOSIS — E871 Hypo-osmolality and hyponatremia: Secondary | ICD-10-CM

## 2021-05-13 DIAGNOSIS — I5032 Chronic diastolic (congestive) heart failure: Secondary | ICD-10-CM

## 2021-05-13 DIAGNOSIS — J45909 Unspecified asthma, uncomplicated: Secondary | ICD-10-CM

## 2021-05-13 DIAGNOSIS — R1310 Dysphagia, unspecified: Secondary | ICD-10-CM

## 2021-05-13 LAB — RENAL FUNCTION PANEL
Albumin: 2.5 g/dL — ABNORMAL LOW (ref 3.5–5.0)
Anion gap: 7 (ref 5–15)
BUN: 26 mg/dL — ABNORMAL HIGH (ref 8–23)
CO2: 26 mmol/L (ref 22–32)
Calcium: 8.8 mg/dL — ABNORMAL LOW (ref 8.9–10.3)
Chloride: 100 mmol/L (ref 98–111)
Creatinine, Ser: 0.76 mg/dL (ref 0.44–1.00)
GFR, Estimated: >60 mL/min (ref >60)
Glucose, Bld: 103 mg/dL — ABNORMAL HIGH (ref 70–99)
Phosphorus: 2.8 mg/dL (ref 2.5–4.6)
Potassium: 4.8 mmol/L (ref 3.5–5.1)
Sodium: 133 mmol/L — ABNORMAL LOW (ref 135–145)

## 2021-05-13 LAB — MAGNESIUM: Magnesium: 2.4 mg/dL (ref 1.7–2.4)

## 2021-05-13 LAB — CBC
HCT: 23.4 % — ABNORMAL LOW (ref 36.0–46.0)
Hemoglobin: 7.9 g/dL — ABNORMAL LOW (ref 12.0–15.0)
MCH: 33.3 pg (ref 26.0–34.0)
MCHC: 33.8 g/dL (ref 30.0–36.0)
MCV: 98.7 fL (ref 80.0–100.0)
Platelets: 351 10*3/uL (ref 150–400)
RBC: 2.37 MIL/uL — ABNORMAL LOW (ref 3.87–5.11)
RDW: 15.7 % — ABNORMAL HIGH (ref 11.5–15.5)
WBC: 6.4 10*3/uL (ref 4.0–10.5)
nRBC: 0.3 % — ABNORMAL HIGH (ref 0.0–0.2)

## 2021-05-13 LAB — GLUCOSE, CAPILLARY
Glucose-Capillary: 110 mg/dL — ABNORMAL HIGH (ref 70–99)
Glucose-Capillary: 123 mg/dL — ABNORMAL HIGH (ref 70–99)
Glucose-Capillary: 128 mg/dL — ABNORMAL HIGH (ref 70–99)
Glucose-Capillary: 206 mg/dL — ABNORMAL HIGH (ref 70–99)
Glucose-Capillary: 208 mg/dL — ABNORMAL HIGH (ref 70–99)
Glucose-Capillary: 94 mg/dL (ref 70–99)

## 2021-05-13 MED ORDER — SENNOSIDES-DOCUSATE SODIUM 8.6-50 MG PO TABS
2.0000 | ORAL_TABLET | Freq: Two times a day (BID) | ORAL | Status: DC
  Administered 2021-05-14 – 2021-05-15 (×2): 2
  Filled 2021-05-13 (×4): qty 2

## 2021-05-13 MED ORDER — SODIUM CHLORIDE 1 G PO TABS: 1.0000 g | ORAL_TABLET | Freq: Two times a day (BID) | ORAL | Status: DC

## 2021-05-13 MED ORDER — DEXAMETHASONE SODIUM PHOSPHATE 10 MG/ML IJ SOLN
6.0000 mg | Freq: Every day | INTRAMUSCULAR | Status: DC
  Administered 2021-05-13 – 2021-05-14 (×2): 6 mg via INTRAVENOUS
  Filled 2021-05-13 (×2): qty 1

## 2021-05-13 MED ORDER — FREE WATER
100.0000 mL | Status: DC
  Administered 2021-05-13 – 2021-05-14 (×6): 100 mL

## 2021-05-13 MED ORDER — OMEPRAZOLE 20 MG PO CPDR
20.0000 mg | DELAYED_RELEASE_CAPSULE | Freq: Two times a day (BID) | ORAL | Status: DC
  Administered 2021-05-13 – 2021-05-15 (×4): 20 mg via ORAL
  Filled 2021-05-13 (×8): qty 1

## 2021-05-13 MED ORDER — FAMOTIDINE 20 MG PO TABS
20.0000 mg | ORAL_TABLET | Freq: Every day | ORAL | Status: DC
  Administered 2021-05-13 – 2021-05-15 (×3): 20 mg
  Filled 2021-05-13 (×4): qty 1

## 2021-05-13 MED ORDER — PROSOURCE TF PO LIQD
45.0000 mL | Freq: Two times a day (BID) | ORAL | Status: DC
  Administered 2021-05-13 – 2021-05-15 (×4): 45 mL
  Filled 2021-05-13 (×5): qty 45

## 2021-05-13 MED ORDER — OXYCODONE HCL 5 MG PO TABS: 2.5000 mg | ORAL_TABLET | Freq: Four times a day (QID) | ORAL | Status: DC | PRN

## 2021-05-13 MED ORDER — OSMOLITE 1.5 CAL PO LIQD
1000.0000 mL | ORAL | Status: DC
  Administered 2021-05-13 (×2): 1000 mL
  Filled 2021-05-13: qty 1000

## 2021-05-13 MED ORDER — AMIODARONE HCL 200 MG PO TABS
200.0000 mg | ORAL_TABLET | Freq: Every day | ORAL | Status: DC
  Administered 2021-05-14 – 2021-05-15 (×2): 200 mg
  Filled 2021-05-13 (×2): qty 1

## 2021-05-13 NOTE — Assessment & Plan Note (Addendum)
History dysphagia due to stricture and large paraesophageal hernia going back years, at least to before 2018.   Last EGD dilation June 2021 but symptoms progressed that year and so paraesophageal hernia repaired surgically by Dr. Kipp Brood in Jan 2022. Unfortunately, post-op she developed persistent nausea, vomiting, difficulty swallowing, 20 lb weight loss and dizziness.  This waxed and waned over 2022 until 1 month ago, the cough, vomiting, nausea got worse, accompanied by cough, phlegm, fatigue.  Finally, her vomiting and anorexia were so severe, she developed electrolyte abnormalities which led to the current admission.  Here, with regard to dysphagia: she has had a barium esophagram that showed mild stricture only.  GI consulted for EGD but felt her medical condition too precarious and needed rehab prior.  As a result of this stalemate, the likely prolonged rehab needed, and inability to take PO adequately, PEG was placed.  It is expected she will rehab, then have EGD and hopefully dilation, and then be able to resume oral intake and take out PEG in 8-12 weeks  - Advance tube feeds  - Continue Pepcid, Omeprazole

## 2021-05-13 NOTE — Assessment & Plan Note (Addendum)
This was brief.  Cardiology recommended not to start anticoagulation given the brief atrial fibrillation which was probably secondary to ventricular fibrillation conversion.  -Continue amiodarone

## 2021-05-13 NOTE — Assessment & Plan Note (Addendum)
Respiratory failure ruled out.  At last Pulm appt in Nov, was 98% on RA. - Continue Breo

## 2021-05-13 NOTE — Assessment & Plan Note (Addendum)
BP slightly elevated - Hold valsartan  - D/c HCTZ at discharge given hypoK - May trial furosemide with K here

## 2021-05-13 NOTE — Assessment & Plan Note (Signed)
Asymptomatic bacteriuria, treated with Rocephin, resolved.

## 2021-05-13 NOTE — Assessment & Plan Note (Signed)
-  Hold iron

## 2021-05-13 NOTE — Assessment & Plan Note (Addendum)
This has actually become her chief issue limiting progression (as her dysphagia is circumvented and her Torsades is resolved)  Given her QT prolongation, we are severely limited in management of her nausea.  Given weakness, I am avoiding benzodiazepines.  I do not suspect gastroparesis or GOO as she is tolerating tube feeds.  Palliative have recommended dexamethasone for nausea.  They also want to rule out esophageal obstruction and central causes of nausea.  - Obtain CXR to make sure no barium is retained in esophagus from 10 days ago (if negative, is there utility to repeat esophagram?)  - Obtain CT head to rule out mass effect causing nausea  - Consult palliative care - Continue dexamethasone  - PT eval

## 2021-05-13 NOTE — Assessment & Plan Note (Addendum)
Initial presenting complaint, had torsades cardiac arrest in the waiting room of the ER, received CPR for less than 6 minutes, actually no defibrillation, and then ROSC and has been cognitively intact  Essentially this is resolved  - Needs Cardiology follow-up at d/c  - Does NOT need LifeVest at d/c  - Keep magnesium greater than 2 and potassium greater than 4 - D/c HCTZ at discharge given hypokalemia, caution with Lasix  -Continue amiodarone

## 2021-05-13 NOTE — Assessment & Plan Note (Addendum)
Na down again - Resume salt tabs

## 2021-05-13 NOTE — Progress Notes (Signed)
Initial Nutrition Assessment  DOCUMENTATION CODES:   Not applicable  INTERVENTION:  - will adjust TF regimen: Osmolite 1.5 @ 30 ml/hr with 45 ml Prosource TF BID and 100 ml free water every 4 hours.  - this regimen will provide 1160 kcal, 67 grams protein, and 1150 ml free water.  - if patient able to tolerate this change, will transition to bolus regimen next date.  - will have serum Mg and serum Phos checked in the AM.     NUTRITION DIAGNOSIS:   Inadequate oral intake related to inability to eat as evidenced by NPO status.  GOAL:   Patient will meet greater than or equal to 90% of their needs  MONITOR:   TF tolerance, Diet advancement, Labs, Weight trends, Skin  REASON FOR ASSESSMENT:   Consult Enteral/tube feeding initiation and management  ASSESSMENT:   87 y.o. female with medical history of hiatal hernia s/p repair 1 year ago, hx PE not on AC, HTN, dCHF, and chronic respiratory failure on 3L home O2. She presented to the ED after a syncopal episode at home. She was a Code Blue in the ED. GI was consulted due to distal esophageal narrowing and PEG was placed by IR on 1/23.  Significant Events: 1/12- admission 1/17- GI consulted and discussion began concerning PEG placement d/t distal esophageal narrowing 1/23- PEG placed by IR 1/24- RD consulted for TF initiation and advancement; discussion had with MD concerning this   Patient laying in bed with daughter and husband at bedside, who provide nearly all information. Patient had 1 episode of vomiting over night, per daughter. During RD visit, noted that [facility] was at 22 degrees so RD raised HOB to 31 degrees.   For several weeks PTA patient was having difficulty with swallowing so she was consuming things that required minimal to no chewing and were easy to swallow (example of jello given). Since admission she has been consuming liquids (such as several cartons of juice/day and several bottles of Boost Breeze/day).   She  was experiencing weakness at home and had a syncopal episode at home which led to ED visit.   Husband shares that patient was scheduled for esophageal dilation at the beginning of next week.  Husband reports that UBW recently has been 125-140 lb range, but that several years ago she was closer to 175 lb. No further details about weight changes able to be elicited at this time.   Weight yesterday was 147 lb and weight has been stable since 1/15. Weight on 1/12 was 140 lb. Weight on 02/03/21 was 151 lb. This indicates 4 lb weight loss (3.3% body weight) in the past 3 months; not significant for time frame.   She has PEG in place and is receiving Osmolite 1.2 @ 20 ml/hr with 30 ml free water every 4 hours. This regimen is providing 576 kcal, 27 grams protein, and 574 ml free water.   Labs reviewed; CBGs: 110, 94, 128 mg/dl, Na: 133 mmol/l, BUN: 26 mg/dl.  Medications reviewed; 40 mEq Klor-Con x3 doses 1/24 and started daily as of 1/25, 2 tablets senokot BID, 1 g NaCal BID.     NUTRITION - FOCUSED PHYSICAL EXAM:  Flowsheet Row Most Recent Value  Orbital Region No depletion  Upper Arm Region No depletion  Thoracic and Lumbar Region Unable to assess  Buccal Region No depletion  Temple Region Mild depletion  Clavicle Bone Region Mild depletion  Clavicle and Acromion Bone Region No depletion  Scapular Bone Region No depletion  Dorsal Hand No depletion  Patellar Region Mild depletion  Anterior Thigh Region No depletion  Posterior Calf Region No depletion  Edema (RD Assessment) None  Hair Reviewed  Eyes Reviewed  Mouth Reviewed  Skin Reviewed  Nails Reviewed       Diet Order:   Diet Order             Diet NPO time specified Except for: Sips with Meds  Diet effective midnight                   EDUCATION NEEDS:   Education needs have been addressed  Skin:  Skin Assessment: Skin Integrity Issues: Skin Integrity Issues:: Stage II Stage II: sacrum  Last BM:  1/25 (type  6 x1, large amount)  Height:   Ht Readings from Last 1 Encounters:  05/01/21 5\' 1"  (1.549 m)    Weight:   Wt Readings from Last 1 Encounters:  05/12/21 66.7 kg     Estimated Nutritional Needs:  Kcal:  1675-1900 kcal Protein:  80-90 grams Fluid:  >/= 1.7 L/day     Jarome Matin, MS, RD, LDN Inpatient Clinical Dietitian RD pager # available in [place]  After hours/weekend pager # available in [facility]

## 2021-05-13 NOTE — Progress Notes (Signed)
Progress Note   Patient: [name] ION:[id number] DOB: [birth year] DOA: 04/30/2021     13 DOS: the patient was seen and examined on 05/13/2021   Brief hospital course: [name] is an 87 y.o. F with hiatal hernia, s/p repair 1 yr ago, c/b chronic post-op nausea, vomiting and anorexia, hx PE not on AC, HTN, dCHF, and asthma not on home O2 (follows with Dr. Valeta Harms) who presented with syncope.  In the ER, had a CODE BLUE, found to have VF/torsades and received CPR --> K 2.4   1/12: Admitted, cardiology consulted, treated with amiodarone gtt 1/16: transferred out of ICU 1/17 GI consulted for distal esophageal narrowing, ?PEG placement 1/23: PEG placed by IR  Assessment and Plan Dysphagia History dysphagia due to stricture and large paraesophageal hernia going back years, at least to before 2018.   Last EGD dilation June 2021 but symptoms progressed that year and so paraesophageal hernia repaired surgically by Dr. Kipp Brood in Jan 2022. Unfortunately, post-op she developed persistent nausea, vomiting, difficulty swallowing, 20 lb weight loss and dizziness.  This waxed and waned over 2022 until 1 month ago, the cough, vomiting, nausea got worse, accompanied by cough, phlegm, fatigue.  Finally, her vomiting and anorexia were so severe, she developed electrolyte abnormalities which led to the current admission.  Here, with regard to dysphagia: she has had a barium esophagram that showed mild stricture only.  GI consulted for EGD but felt her medical condition too precarious and needed rehab prior.  As a result of this stalemate, the likely prolonged rehab needed, and inability to take PO adequately, PEG was placed.  It is expected she will rehab, then have EGD and hopefully dilation, and then be able to resume oral intake and take out PEG in 8-12 weeks  - Advance tube feeds  - Resume Pepcid and PPI as she has a lot of reflux like symptoms  Ventricular fibrillation (HCC) Initial  presenting complaint, had torsades cardiac arrest in the waiting room of the ER, received CPR for less than 6 minutes, actually no defibrillation, and then ROSC and has been cognitively intact  Essentially this is resolved - Needs LifeVest at d/c  - Needs Cardiology follow-up at d/c - Keep magnesium greater than 2 and potassium greater than 4  -Continue amiodarone  Vertigo With severe nausea.  Given her QT prolongation, we are severely limited in management of her nausea.    She is also very sleepy, so I am cautious about benzodiazepines.  - Consult palliative care - Start dexamethasone - PT eval  Hyponatremia Stable - Stop salt tabs  Pressure injury of skin- (present on admission) Stage II sacrum, not pOA  Atrial fibrillation with RVR (HCC) Not on AC at baseline -Continue amiodarone  Acute lower UTI Asymptomatic bacteriuria, treated with Rocephin, resolved.  Chronic diastolic CHF (congestive heart failure) ([place])- (present on admission) - Start oral Lasix when tube feeds at goal rate  Iron deficiency anemia- (present on admission) -Hold iron  Asthma without acute exacerbation- (present on admission) Respiratory failure ruled out.  At last Pulm appt in Nov, was 98% on RA. - Continue Breo  Hypertension- (present on admission) BP controlled -Hold valsartan HCTZ     Subjective: No confusion, fever.  Still with cough, mucus, fatigue, vertigo, weakness, dizziness, nausea.      Objective Vital signs were reviewed and unremarkable. Elderly adult female, lying in bed, appears weak and debilitated, attention normal when family are out of the room, oriented to person, situation.  Heart rate normal, no murmurs, she has some right arm edema, mild nonpitting peripheral edema in the other extremities.  Respiratory rate normal, lungs clear without rales or wheezes.  Diminished throughout however, abdomen soft without tenderness palpation.  She has mild voluntary guarding  throughout.  Data Reviewed: Discussed with palliative care My review of labs and imaging is notable for sodium stable at 133, albumin 2.5.  Magnesium normal.  Complete blood count notable for hemoglobin up to 7.9.  Glucose normal. Previous imaging personally reviewed, shows increasing bibasilar opacities on chest x-ray, barium esophagram with mild narrowing, CT angiogram of the chest last month that showed no significant airspace disease.  Family Communication: Husband and daughter at the bedside  Disposition: Status is: Inpatient  Remains inpatient appropriate because: From the standpoint of her cardiac arrest, the patient is cleared for discharge.  From the standpoint of her dysphagia, the patient now has a PEG tube, will likely be at goal rate tube feeds tomorrow and will be ready for discharge.  However she will need significant rehab, to return to her prior level of function, and rehab bed is pending.          Author: Edwin Dada, MD 05/13/2021 3:41 PM  For on call review [email or web address].

## 2021-05-13 NOTE — Assessment & Plan Note (Signed)
Stage II sacrum, not pOA

## 2021-05-13 NOTE — Assessment & Plan Note (Addendum)
-   Can resume oral Lasix tomorrow now that tube feeds at goal rate

## 2021-05-14 ENCOUNTER — Inpatient Hospital Stay (HOSPITAL_COMMUNITY): Payer: Medicare PPO

## 2021-05-14 DIAGNOSIS — R110 Nausea: Secondary | ICD-10-CM

## 2021-05-14 LAB — GLUCOSE, CAPILLARY
Glucose-Capillary: 225 mg/dL — ABNORMAL HIGH (ref 70–99)
Glucose-Capillary: 258 mg/dL — ABNORMAL HIGH (ref 70–99)
Glucose-Capillary: 205 mg/dL — ABNORMAL HIGH (ref 70–99)
Glucose-Capillary: 156 mg/dL — ABNORMAL HIGH (ref 70–99)
Glucose-Capillary: 263 mg/dL — ABNORMAL HIGH (ref 70–99)
Glucose-Capillary: 275 mg/dL — ABNORMAL HIGH (ref 70–99)

## 2021-05-14 LAB — BASIC METABOLIC PANEL
Anion gap: 8 (ref 5–15)
BUN: 28 mg/dL — ABNORMAL HIGH (ref 8–23)
CO2: 22 mmol/L (ref 22–32)
Calcium: 8.8 mg/dL — ABNORMAL LOW (ref 8.9–10.3)
Chloride: 98 mmol/L (ref 98–111)
Creatinine, Ser: 0.78 mg/dL (ref 0.44–1.00)
GFR, Estimated: >60 mL/min (ref >60)
Glucose, Bld: 225 mg/dL — ABNORMAL HIGH (ref 70–99)
Potassium: 5.2 mmol/L — ABNORMAL HIGH (ref 3.5–5.1)
Sodium: 128 mmol/L — ABNORMAL LOW (ref 135–145)

## 2021-05-14 LAB — CBC
HCT: 22.2 % — ABNORMAL LOW (ref 36.0–46.0)
Hemoglobin: 7.2 g/dL — ABNORMAL LOW (ref 12.0–15.0)
MCH: 32.3 pg (ref 26.0–34.0)
MCHC: 32.4 g/dL (ref 30.0–36.0)
MCV: 99.6 fL (ref 80.0–100.0)
Platelets: 347 10*3/uL (ref 150–400)
RBC: 2.23 MIL/uL — ABNORMAL LOW (ref 3.87–5.11)
RDW: 15.4 % (ref 11.5–15.5)
WBC: 3.2 10*3/uL — ABNORMAL LOW (ref 4.0–10.5)
nRBC: 0.9 % — ABNORMAL HIGH (ref 0.0–0.2)

## 2021-05-14 LAB — PHOSPHORUS: Phosphorus: 2.5 mg/dL (ref 2.5–4.6)

## 2021-05-14 LAB — MAGNESIUM: Magnesium: 2 mg/dL (ref 1.7–2.4)

## 2021-05-14 MED ORDER — DEXAMETHASONE SODIUM PHOSPHATE 4 MG/ML IJ SOLN
4.0000 mg | Freq: Every day | INTRAMUSCULAR | Status: DC
  Administered 2021-05-15 – 2021-05-16 (×2): 4 mg via INTRAVENOUS
  Filled 2021-05-14 (×2): qty 1

## 2021-05-14 MED ORDER — FUROSEMIDE 20 MG PO TABS
20.0000 mg | ORAL_TABLET | Freq: Every day | ORAL | Status: DC
  Administered 2021-05-15: 20 mg via ORAL
  Filled 2021-05-14 (×2): qty 1

## 2021-05-14 MED ORDER — INSULIN ASPART 100 UNIT/ML IJ SOLN: 0.0000 [IU] | Freq: Every day | INTRAMUSCULAR | Status: DC

## 2021-05-14 MED ORDER — FREE WATER
200.0000 mL | Status: DC
  Administered 2021-05-14 – 2021-05-15 (×6): 200 mL

## 2021-05-14 MED ORDER — FREE WATER
250.0000 mL | Status: DC
Start: 2021-05-14 — End: 2021-05-14

## 2021-05-14 MED ORDER — SODIUM CHLORIDE 0.9 % IV SOLN
50.0000 ug/h | INTRAVENOUS | Status: DC
  Administered 2021-05-14 – 2021-05-15 (×3): 50 ug/h via INTRAVENOUS
  Filled 2021-05-14 (×4): qty 1

## 2021-05-14 MED ORDER — SODIUM CHLORIDE 1 G PO TABS
1.0000 g | ORAL_TABLET | Freq: Two times a day (BID) | ORAL | Status: DC
  Administered 2021-05-14 – 2021-05-15 (×2): 1 g
  Filled 2021-05-14 (×2): qty 1

## 2021-05-14 MED ORDER — INSULIN ASPART 100 UNIT/ML IJ SOLN
0.0000 [IU] | Freq: Three times a day (TID) | INTRAMUSCULAR | Status: DC
  Administered 2021-05-14: 5 [IU] via SUBCUTANEOUS

## 2021-05-14 MED ORDER — GLYCOPYRROLATE 0.2 MG/ML IJ SOLN
0.1000 mg | Freq: Three times a day (TID) | INTRAMUSCULAR | Status: DC
  Administered 2021-05-14 – 2021-05-15 (×2): 0.1 mg via INTRAVENOUS
  Filled 2021-05-14 (×5): qty 0.5

## 2021-05-14 MED ORDER — INSULIN ASPART 100 UNIT/ML IJ SOLN
0.0000 [IU] | INTRAMUSCULAR | Status: DC
  Administered 2021-05-14 (×2): 5 [IU] via SUBCUTANEOUS
  Administered 2021-05-14: 2 [IU] via SUBCUTANEOUS
  Administered 2021-05-15: 1 [IU] via SUBCUTANEOUS
  Administered 2021-05-15: 2 [IU] via SUBCUTANEOUS
  Administered 2021-05-15: 5 [IU] via SUBCUTANEOUS
  Administered 2021-05-15: 3 [IU] via SUBCUTANEOUS

## 2021-05-14 MED ORDER — OSMOLITE 1.5 CAL PO LIQD
1000.0000 mL | ORAL | Status: DC
  Administered 2021-05-14: 1000 mL
  Filled 2021-05-14 (×3): qty 1000

## 2021-05-14 NOTE — Plan of Care (Signed)
  Problem: Education: Goal: Knowledge of General Education information will improve Description Including pain rating scale, medication(s)/side effects and non-pharmacologic comfort measures Outcome: Progressing   

## 2021-05-14 NOTE — Progress Notes (Signed)
Nutrition Follow-up  DOCUMENTATION CODES:   Not applicable  INTERVENTION:  - advanced Osmolite 1.5 to 40 ml/hr at this time and then advance to goal rate of 50 ml/hr in 8 hours.  - continue 45 ml Prosource TF BID and 100 ml free water every 4 hours. - at goal, this regimen will provide 1880 kcal, 97 grams protein, and 1517 ml free water.  - if patient to d/c to rehab/facility today, transition to bolus regimen can occur at the facility and patient does not need to remain inpatient for this to occur.    NUTRITION DIAGNOSIS:   Inadequate oral intake related to inability to eat as evidenced by NPO status. -ongoing  GOAL:   Patient will meet greater than or equal to 90% of their needs -to be met with TF at goal rate.   MONITOR:   TF tolerance, PO intake, Diet advancement, Labs, Weight trends, Skin   ASSESSMENT:   87 y.o. female with medical history of hiatal hernia s/p repair 1 year ago, hx PE not on AC, HTN, dCHF, and chronic respiratory failure on 3L home O2. She presented to the ED after a syncopal episode at home. She was a Code Blue in the ED. GI was consulted due to distal esophageal narrowing and PEG was placed by IR on 1/23.  Significant Events: 1/12- admission 1/17- GI consulted and discussion began concerning PEG placement d/t distal esophageal narrowing 1/23- PEG placed by IR 1/24- RD consulted for TF initiation and advancement; discussion had with MD concerning this 1/25- initial RD assessment, begin TF advancement to goal   Patient laying in bed with husband and daughter at bedside. Patient is receiving Osmolite 1.5 @ 30 ml/hr 45 ml Prosource TF BID and 100 ml free water every 4 hours. This regimen is providing 1160 kcal, 67 grams protein, and 1150 ml free water.  Family shares that patient had an episode of vomiting this AM that occurred ~15-20 minutes after consuming ice chips. No emesis in emesis bag; patient used yanker and daughter shares that output appeared  clear.   Able to talk with RN who shares that patient was taking ice chips and juice yesterday evening and earlier this AM, prior to episode, without any issue. RN and two nursing students were sitting outside of patient's room and heard what sounded like dry heaving. Family stepped out of patient's room and alerted RN that patient was vomiting. RN and nursing students entered the room at which time patient was no longer vomiting.   Communicated with RN that continuous tube feeding, rather than transition to bolus regimen, to ensure patient is tolerating volume of TF without issue.   Noted current serum Na and that serum Na was the same on 1/18-1/20 when patient was on FLD and consuming 0-10% at meals. NaCl tablets started 1/19. Noted serum K at high end of normal today; patient was previously receiving K supplementation, which has now been discontinued.    Labs reviewed; CBGs: 225, 258, 205 mg/dl, Na: 128 mmol/l, K: 5.2 mmol/l, BUN: 28 mg/dl, Mg and Phos WDL.  Medications reviewed; 20 mg pepcid per PEG/day, sliding scale novolog, 2 tablets senokot BID, 1 tablet NaCl per PEG BID.    Diet Order:   Diet Order             Diet clear liquid Room service appropriate? Yes; Fluid consistency: Thin  Diet effective now  EDUCATION NEEDS:   Education needs have been addressed  Skin:  Skin Assessment: Skin Integrity Issues: Skin Integrity Issues:: Stage I Stage I: sacrum  Last BM:  1/25 (type 6, large amount)  Height:   Ht Readings from Last 1 Encounters:  05/01/21 5' 1"  (1.549 m)    Weight:   Wt Readings from Last 1 Encounters:  05/14/21 66.7 kg     Estimated Nutritional Needs:  Kcal:  1675-1900 kcal Protein:  80-90 grams Fluid:  >/= 1.7 L/day       Jarome Matin, MS, RD, LDN Inpatient Clinical Dietitian RD pager # available in [place]  After hours/weekend pager # available in [facility]

## 2021-05-14 NOTE — Assessment & Plan Note (Signed)
-   Resume Pepcid and PPI

## 2021-05-14 NOTE — Progress Notes (Incomplete)
Pt very weak, vomiting, nauseated.

## 2021-05-14 NOTE — Consult Note (Signed)
Palliative Care Consultation Note Requested by: [facility]  Mrs. Deshotels is an 87 yo woman with CHF, Asthma and [place] who has had multiple complications following repair of a large hiatal hernia 1 year ago mostly characterized by persistent nausea, weight loss, vomiting and associated dizziness. She was admitted with acutely worsening symptoms and suffered a cardiac arrest due to electrolyte abnormalities. She has been unable to maintain her nutrition due to a distal esophageal stricture. During this admission she has had a PEG tube placed, but is severely deconditioned.  Goals: Shared goal of the medical team and patient is to restore her as close as possible to a functional and independent quality of life. She has declined significantly over the past year but is hopeful she can improve and is willing to do whatever is necessary. They understand the road will be slow but as long as they are moving forward they desire continued medical interventions and support. Given her experience with CPR and associate discomfort would not desire an attempt again at resuscitation but would want full scope treatment options outside of an arrest.  Symptoms: Main issue is persistent nausea, inability to eat and severe vertigo.  Recommendations: Refractory Nausea, Vomiting and Weight loss due to inadequate PO intake Now has a PEG tube for primary nutrition which will help with the electrolyte abnormalities. She has a cardiac arrest-Torsades due to electrolye abnormalities and prolonged QT worsened by ati-emetics. Recommend using steroids- burst dose and wean to lowest possible dose that controls her symptoms.  Decadron 6mg  IV daily for 3 days, the 4mg  for three days. further titration as tolerated. She received a dose of steroids about 2 hours prior to my visit and seemed to be much better. Continue to monitor QT and hold other antiemetics for now. 2. Severe Vertigo Etiology of this is likely multifactorial, but suspect there  could be reflux related labyrinthitis or vagus nerve inflammation from prior surgery and stricture. Decadron should help with this as well if related to vestibular inflammation. Will ask PT to check orthostatic BP with OOB  She has not had any Brain imaging but if vertigo persists this may be valuable in determining if there other other central ns related issues.  Will continue to support and follow this patient with a focus on symptom management and ongoing goals of care conversations.  Discussed plan with patient, her daughter and husband at bedside.  Lane Hacker, DO Palliative Medicine   Time: 75 minutes

## 2021-05-14 NOTE — Progress Notes (Signed)
Progress Note   Patient: [name] DOB: [birth year] DOA: 04/30/2021     14 DOS: the patient was seen and examined on 05/14/2021      Brief hospital course: [name] is an 87 y.o. F with hiatal hernia, s/p repair 1 yr ago, c/b chronic post-op nausea, vomiting and anorexia, hx PE not on AC, HTN, dCHF, and asthma not on home O2 (follows with Dr. Valeta Harms) who presented with syncope.  In the ER, had a CODE BLUE, found to have VF/torsades and received CPR --> K 2.4   1/12: Admitted, cardiology consulted, treated with amiodarone gtt 1/16: transferred out of ICU 1/17 GI consulted for distal esophageal narrowing, ?PEG placement 1/23: PEG placed by IR 1/24-1/26: Severe nausea, palliative consulted       Assessment and Plan Dysphagia History dysphagia due to stricture and large paraesophageal hernia going back years, at least to before 2018.   Last EGD dilation June 2021 but symptoms progressed that year and so paraesophageal hernia repaired surgically by Dr. Kipp Brood in Jan 2022. Unfortunately, post-op she developed persistent nausea, vomiting, difficulty swallowing, 20 lb weight loss and dizziness.  This waxed and waned over 2022 until 1 month ago, the cough, vomiting, nausea got worse, accompanied by cough, phlegm, fatigue.  Finally, her vomiting and anorexia were so severe, she developed electrolyte abnormalities which led to the current admission.  Here, with regard to dysphagia: she has had a barium esophagram that showed mild stricture only.  GI consulted for EGD but felt her medical condition too precarious and needed rehab prior.  As a result of this stalemate, the likely prolonged rehab needed, and inability to take PO adequately, PEG was placed.  It is expected she will rehab, then have EGD and hopefully dilation, and then be able to resume oral intake and take out PEG in 8-12 weeks  - Advance tube feeds  - Continue Pepcid, Omeprazole     Ventricular  fibrillation (HCC) Initial presenting complaint, had torsades cardiac arrest in the waiting room of the ER, received CPR for less than 6 minutes, actually no defibrillation, and then ROSC and has been cognitively intact  Essentially this is resolved  - Needs Cardiology follow-up at d/c  - Does NOT need LifeVest at d/c  - Keep magnesium greater than 2 and potassium greater than 4 - D/c HCTZ at discharge given hypokalemia, caution with Lasix  -Continue amiodarone     Vertigo and nausea This has actually become her chief issue limiting progression (as her dysphagia is circumvented and her Torsades is resolved)  Given her QT prolongation, we are severely limited in management of her nausea.  Given weakness, I am avoiding benzodiazepines.  I do not suspect gastroparesis or GOO as she is tolerating tube feeds.  Palliative have recommended dexamethasone for nausea.  They also want to rule out esophageal obstruction and central causes of nausea.  - Obtain CXR to make sure no barium is retained in esophagus from 10 days ago (if negative, is there utility to repeat esophagram?)  - Obtain CT head to rule out mass effect causing nausea  - Consult palliative care - Continue dexamethasone  - PT eval     Hypokalemia- (present on admission) K ~2 on admission, had Torsades arrest.  Subsequently supplemented.  Now with hyperkalemia today, up to 5.2  -Hold K supplement, trend K - Cautious furosemide     Hyponatremia Na down again - Resume salt tabs  Pressure injury of skin- (present on admission)  Stage II sacrum, not pOA  Atrial fibrillation with RVR (HCC) This was brief.  Cardiology recommended not to start anticoagulation given the brief atrial fibrillation which was probably secondary to ventricular fibrillation conversion.  -Continue amiodarone  Acute lower UTI Asymptomatic bacteriuria, treated with Rocephin, resolved.  Gastro-esophageal reflux disease without  esophagitis- (present on admission) - Resume Pepcid and PPI  Chronic diastolic CHF (congestive heart failure) ([place])- (present on admission) - Can resume oral Lasix tomorrow now that tube feeds at goal rate  Iron deficiency anemia- (present on admission) -Hold iron  Asthma without acute exacerbation- (present on admission) Respiratory failure ruled out.  At last Pulm appt in Nov, was 98% on RA. - Continue Breo  Hypertension- (present on admission) BP slightly elevated - Hold valsartan  - D/c HCTZ at discharge given hypoK - May trial furosemide with K here         Subjective: Had a large bowel movement today is feeling somewhat better, after that and some flatulence.  No headache, fever, respiratory distress.  Overall still very weak      Objective Vital signs were reviewed and unremarkable for mild high blood pressure. Elderly adult female, lying in bed, appears weak and debilitated. Attention normal, oriented to person place and time. Heart rate normal, no murmurs, right arm edema, and mild nonpitting edema in all other extremities. Respiratory rate normal, lungs clear without rales or wheezes, relatively good air movement, no crackles. She appears sleepy and weak, but moves both upper extremities with generalized weakness and symmetric strength       Data Reviewed: My review of labs and imaging is notable for slightly elevated glucose Basic metabolic panel for lower sodium, normal magnesium and phosphate, potassium up to5.2 Hemoglobin down to 7.2 Discussed with palliative care     Family Communication: [name] and [name] at the bedside  Disposition: Status is: Inpatient  Remains inpatient appropriate because: From the standpoint of her cardiac arrest, the patient is cleared for discharge.  From the standpoint of her dysphagia, the patient now has a PEG tube, is now at goal rate tube feeds, and is essentially ready for discharge.  Medically ready for  discharge when bed available         Author: Edwin Dada, MD 05/14/2021 4:40 PM  For on call review [email or web address].

## 2021-05-14 NOTE — Progress Notes (Addendum)
Palliative Care Progress Note  Mrs. Gierke is barely responsive to me today-a dramatic change in her mental status compared to yesterday, but more in line with how she has been over the course of her hospitalization. She has had some significant electrolyte shifts over the past 24 hours including hyperglycemia (steroids plus increased TF rate), hyperkalemia, Mag 2.4->2.0, phos 3.7-->2.8-->2.5 since starting feeding started.  In addition to her mental status changes she continues to vomit up large amounts of clear mucous and saliva-no tube feeding is in her emesis. It is associated with severe nausea as well.  Acutely worsening mental status since yesterday Electrolyte shifts- possible refeeding-phos has been falling since initiation of TF as well as her magnesium. Nausea vomiting recurrence after getting relief yesterday with steroid dose-this is likely directly related to what appears to be a complete distal esophageal stricture based on her symptoms and her cycles of vomiting mucous. Severe Deconditioning Vertigo associated with Nausea Anemia w/high MCV-likely has B12 deficiency contributing  Recommendations:  DNR Continue to aggressively mange electrolyte and fluid shifts until stable on tube feeding, including blood glucose levels. Maintain steroid administration-will reduce dose to 4mg  daily in AM Start Thiamine, replete mag and phos Will do a trial of IV octreotide for 24 hours to see if it improves her symptoms of nausea related to esophageal stricture/obstruction and subsequent dilation and vagus mediated NV. Consider possible palliative interventions such as esophageal stenting,stricture injections or further assessment of her esophagus. May also consider head CT to r/o cns causes of persistent NV and vertigo. Will also start robinul to reduce oral and GI secretions.   I had a discussion with her daughter and son re: goals of care. If she continues to decline and does not make  improvements over the next couple of days we need to have a discussion about possibly transitioning to comfort care and even home with hospice in the absence of a reversible condition.   Lane Hacker, DO Palliative Medicine   Time: 55 min

## 2021-05-14 NOTE — Assessment & Plan Note (Addendum)
K ~2 on admission, had Torsades arrest.  Subsequently supplemented.  Now with hyperkalemia today, up to 5.2  -Hold K supplement, trend K - Cautious furosemide

## 2021-05-14 NOTE — TOC Progression Note (Signed)
Transition of Care [facility]) - Progression Note    Patient Details  Name: [name] MRN: [id number] Date of Birth: 05/28/1934  Transition of Care [facility]) CM/SW Contact  Kermitt Harjo, Juliann Pulse, RN Phone Number: 05/14/2021, 2:53 PM  Clinical Narrative: Noted palliative care following.May need comfort care or home w/hospice.Continue to follow for d/c needs.      Expected Discharge Plan:  (TBD) Barriers to Discharge: Continued Medical Work up  Expected Discharge Plan and Services Expected Discharge Plan:  (TBD)                                               Social Determinants of Health (SDOH) Interventions    Readmission Risk Interventions No flowsheet data found.

## 2021-05-14 NOTE — TOC Progression Note (Signed)
Transition of Care [facility]) - Progression Note    Patient Details  Name: DANIJAH NOH MRN: [id number] Date of Birth: 11-Nov-1934  Transition of Care [facility]) CM/SW Contact  Bennie Chirico, Juliann Pulse, RN Phone Number: 05/14/2021, 11:32 AM  Clinical Narrative:  Damaris Schooner to dtr Katharine Look about other bed offers since patient may be medically stable prior next week as a back up-agreed to review list.-await choice.   1. 1.3 mi [facility] [place], [place] [address] [phone] Overall rating Average 2. 1.6 mi [place] at [place], [place] [address] [phone] Overall rating Much below average 3. 2.1 mi [place], [place] [address] [phone] Overall rating Much below average 4. 2.5 mi [facility] [place], [place] [address] [phone] Overall rating Below average 5. 2.8 mi [facility] at the [place], [place] [address] [phone] Overall rating Average 6. 2.8 mi [facility] and [facility] [address] [place], [place] [address] [phone] Overall rating Much below average 7. [place], [place] [address] [phone] Overall rating Much above average 8. 3.6 [place] [address] [place], [place] [address] [phone] Overall rating Average 9. 3.6 mi [facility] [address], [place] [address] [phone] Overall rating Much below average 10. 3.9 mi [facility] [place], [place] [address] [phone] Overall rating Much below average 11. 4.4 mi [facility] at [place], [place] [address] [phone] Overall rating Much above average 12. 5.5 mi [facility] [address] [place], [place] [address] [phone] Overall rating Above average 13. 8.2 [facility] [place], [place] [address] [phone] Overall rating Much above average 14. 9 mi [facility] [address] [place], [place] [address] [phone] Overall rating Above average 15. 9.1 mi [facility] and [place], [place] [address] [phone] Overall rating Average 16. 9.2 mi [facility] [address] [place], [place] [address] [phone] Overall rating Much above average 17. 10.8 mi [place] at [facility] [address] [place], [place] [address] [phone] Overall rating Much above average 18. 12.6 mi [facility] [address] [place], [place] [address] [phone] Overall rating Much below average 19. 12.8 [facility] [place], [place] [address] [phone] Overall rating Much below average 20. 14.2 mi The [place] [facility] [address] [place], [place] [address] [phone] Overall rating Below average 21. 14.4 mi [facility] at [place], [place] [address] [phone] Overall rating Above average 22. 14.8 mi [place] and [facility] [place], [place] [address] [phone] Overall rating Much above average 23. 14.9 [place] [address] [place], [place] [address] [phone] Overall rating Much below average 24. 16.5 mi [facility] [address] [place],  [address] [phone] Overall rating Average 25. 16.7 mi [facility] [place],  [address] [phone] Overall rating Much above average 26. 17.9 mi [employer]  [place], [place] [address] [phone] Overall rating Below average 27. 73.2 [facility] [address] [place], [place] [address] [phone] Overall rating Much below average 28. 19.7 mi [place] [address] [place], [place] [address] [phone] Overall rating Much below average 29. 20 [address] at the [facility] at [facility], [place] [address] [phone] Overall rating Much above average 30. 21.1 mi [facility] and [facility] [place], [place] [address] [phone] Overall rating Much below average 31. 21.6 mi [address] [place], [place] [address] [phone] Overall rating Below average 32. 21.6 mi [facility] [address] [place], [place] [address] [phone] Overall rating Average 33. 21.8 [facility] [address] [place], [place] [address] [phone] Overall rating Much above average 34. [address] [place], [place] [address] [phone] Overall rating Much above average 35. 22.6 mi [facility] [address] [place], [place] [address] [phone] Overall rating Average 36. 22.7 mi [facility] [place], [place] [address] [phone] Overall rating Much below average 37. 23.3 mi [facility] [address] [place], [place] [address] [phone] Overall rating Above average 38. 23.5 [place], [place] [address] [phone] Overall rating Not available18 39. 24.1 mi [place] [address] [place], [place] [address] [phone] Overall rating Much below average 40. 24.2 mi [place] [address] [place], [place] [address] [phone] Overall rating Below average 41. 24.4 [facility] [address] [place], [place] [address] [phone] Overall rating Much below average 42. 24.5 mi [place],  [address] [phone] Overall rating Above average To explore and do  Expected Discharge Plan: [place] Barriers to Discharge: Continued Medical Work up  Expected Discharge Plan and Services Expected Discharge Plan: [place] Determinants of Health (SDOH) Interventions    Readmission Risk Interventions No flowsheet data found.

## 2021-05-15 DIAGNOSIS — Z515 Encounter for palliative care: Secondary | ICD-10-CM

## 2021-05-15 LAB — CBC
HCT: 23.4 % — ABNORMAL LOW (ref 36.0–46.0)
Hemoglobin: 7.4 g/dL — ABNORMAL LOW (ref 12.0–15.0)
MCH: 32.3 pg (ref 26.0–34.0)
MCHC: 31.6 g/dL (ref 30.0–36.0)
MCV: 102.2 fL — ABNORMAL HIGH (ref 80.0–100.0)
Platelets: 348 10*3/uL (ref 150–400)
RBC: 2.29 MIL/uL — ABNORMAL LOW (ref 3.87–5.11)
RDW: 15.2 % (ref 11.5–15.5)
WBC: 4.3 10*3/uL (ref 4.0–10.5)
nRBC: 1.6 % — ABNORMAL HIGH (ref 0.0–0.2)

## 2021-05-15 LAB — BASIC METABOLIC PANEL
Anion gap: 8 (ref 5–15)
BUN: 35 mg/dL — ABNORMAL HIGH (ref 8–23)
CO2: 21 mmol/L — ABNORMAL LOW (ref 22–32)
Calcium: 8.4 mg/dL — ABNORMAL LOW (ref 8.9–10.3)
Chloride: 99 mmol/L (ref 98–111)
Creatinine, Ser: 0.77 mg/dL (ref 0.44–1.00)
GFR, Estimated: >60 mL/min (ref >60)
Glucose, Bld: 201 mg/dL — ABNORMAL HIGH (ref 70–99)
Potassium: 5.3 mmol/L — ABNORMAL HIGH (ref 3.5–5.1)
Sodium: 128 mmol/L — ABNORMAL LOW (ref 135–145)

## 2021-05-15 LAB — GLUCOSE, CAPILLARY
Glucose-Capillary: 138 mg/dL — ABNORMAL HIGH (ref 70–99)
Glucose-Capillary: 172 mg/dL — ABNORMAL HIGH (ref 70–99)
Glucose-Capillary: 178 mg/dL — ABNORMAL HIGH (ref 70–99)
Glucose-Capillary: 215 mg/dL — ABNORMAL HIGH (ref 70–99)
Glucose-Capillary: 271 mg/dL — ABNORMAL HIGH (ref 70–99)

## 2021-05-15 LAB — VITAMIN B12: Vitamin B-12: 224 pg/mL (ref 180–914)

## 2021-05-15 LAB — HEMOGLOBIN A1C
Hgb A1c MFr Bld: 5.4 % (ref 4.8–5.6)
Mean Plasma Glucose: 108 mg/dL

## 2021-05-15 LAB — MAGNESIUM: Magnesium: 2.1 mg/dL (ref 1.7–2.4)

## 2021-05-15 LAB — PHOSPHORUS: Phosphorus: 2.6 mg/dL (ref 2.5–4.6)

## 2021-05-15 MED ORDER — GLYCOPYRROLATE 0.2 MG/ML IJ SOLN
0.1000 mg | Freq: Two times a day (BID) | INTRAMUSCULAR | Status: DC
  Administered 2021-05-16 (×2): 0.1 mg via INTRAVENOUS
  Filled 2021-05-15 (×2): qty 0.5

## 2021-05-15 MED ORDER — HYDROMORPHONE HCL 1 MG/ML IJ SOLN
0.5000 mg | INTRAMUSCULAR | Status: DC | PRN
  Administered 2021-05-15 – 2021-05-16 (×7): 0.5 mg via INTRAVENOUS
  Filled 2021-05-15 (×7): qty 0.5

## 2021-05-15 MED ORDER — ONDANSETRON HCL 4 MG/2ML IJ SOLN
4.0000 mg | INTRAMUSCULAR | Status: DC
  Administered 2021-05-15 – 2021-05-16 (×5): 4 mg via INTRAVENOUS
  Filled 2021-05-15 (×5): qty 2

## 2021-05-15 MED ORDER — ACETAMINOPHEN 500 MG PO TABS
1000.0000 mg | ORAL_TABLET | Freq: Three times a day (TID) | ORAL | Status: DC
  Administered 2021-05-15: 1000 mg
  Filled 2021-05-15 (×2): qty 2

## 2021-05-15 MED ORDER — THIAMINE HCL 100 MG/ML IJ SOLN
100.0000 mg | Freq: Every day | INTRAMUSCULAR | Status: DC
  Administered 2021-05-15: 100 mg via INTRAVENOUS
  Filled 2021-05-15: qty 2

## 2021-05-15 MED ORDER — CYANOCOBALAMIN 1000 MCG/ML IJ SOLN
1000.0000 ug | Freq: Once | INTRAMUSCULAR | Status: AC
  Administered 2021-05-15: 1000 ug via INTRAMUSCULAR
  Filled 2021-05-15: qty 1

## 2021-05-15 MED ORDER — METOCLOPRAMIDE HCL 5 MG/ML IJ SOLN
10.0000 mg | Freq: Three times a day (TID) | INTRAMUSCULAR | Status: DC
  Administered 2021-05-15 – 2021-05-16 (×4): 10 mg via INTRAVENOUS
  Filled 2021-05-15 (×4): qty 2

## 2021-05-15 NOTE — Progress Notes (Addendum)
[facility] Progress Note GI Re-Consult    History of presenting illness: Jalaya Sarver L. Daye is an 87 year old female with a past medical history hypertension, chronic diastolic CHF, diabetes mellitus type 2, COPD on home oxygen IDA, GERD, esophageal stricture,  paraesophageal hernia and chronic organoaxial volvulus s/p robotic assisted paraesophageal hernia repair 04/2020. Past tubal ligation, abdominal hysterectomy and cholecystectomy.  She was seen in our outpatient office by Dr. Fuller Plan on 04/28/2021 due to having nausea/vomiting, dysphagia and a poor appetite. She is status post paraesophageal hernia repair, robotic assisted laparotomy with a chronic organoaxial volvulus in January 2022.  Since her surgery, she has had intermittent problems with nausea vomiting difficulty swallowing and poor appetite. A barium swallow study 05/04/2021 showed an unchanged mild peptic stricture in the distal esophagus near the GE junction with mild esophageal dysmotility. Omeprazole 40 mg was increased to twice daily and she was prescribed Reglan 5 mg p.o. 3 times daily and Phenergan as needed.  A repeat EGD as an outpatient was scheduled on 06/01/2020.   She was admitted to [facility]01/2021 secondary to having several syncopal episodes. While in the ED, she was found to be in torsade de points and received brief CPR without defibrillation. She was in atrial fibrillation with RVR which converted with Amiodarone and she was admitted to the ICU.  She was significantly hypokalemic and it was thought that this precipitated her arrhythmias.  She also had exacerbation of her COPD which required supplemental oxygen.  A GI consult was requested due to her persistent N/V and dysphagia. She was evaluated by our GI service on 05/05/2021 and an EGD with esophageal dilation was recommended once her cardiac and respiratory status stabilized.  Due to her dysphagia with limited p.o. intake, a G-tube was  placed by IR on 05/11/2021.  She remains on tube feedings which have been tolerated well.  Her cardiac and respiratory status have stabilized.  Hospitalist Dr. Florencia Reasons requested a repeat GI consult for consideration for EGD with esophageal dilatation at this time.  It is felt the patient will not progress further without adequate oral nutrition and EGD with esophageal dilatation was recommended at this juncture.  She is tolerating ice chips and sips of water and small quantities of apple juice.  However, she intermittently vomits water or apple juice.  She last vomited apple juice yesterday.  No vomiting today.  She has frequent nausea and spits up a lot of phlegm. She describes having fluids which gets stuck to the mid esophagus which results in vomiting or sometimes she waits and the stuck liquid passes down into the stomach.  She denies having any heartburn.  No upper or lower abdominal pain.  She is passing loose nonbloody stools while on tube feeds.  No bloody stools or melena as reported by the patient and confirmed by her RN.  Her husband and daughter are currently at the bedside.  Objective:   Barium swallow with tablet 05/04/2021: 1. Unchanged mild peptic stricture in the distal esophagus near the GE junction. 2. Mild esophageal dysmotility  Vital signs in last 24 hours: Temp:  [97.8 F (36.6 C)-98.6 F (37 C)] 97.8 F (36.6 C) (01/27 1419) Pulse Rate:  [57-61] 61 (01/27 1419) Resp:  [17-20] 20 (01/27 1419) BP: (174-180)/(61-70) 178/70 (01/27 1419) SpO2:  [96 %-98 %] 96 % (01/27 1419) Weight:  [68.2 kg] 68.2 kg (01/27 0457) Last BM Date: 05/15/21 General: Alert fatigued appearing 87 year old female in no  acute distress. Eyes: No scleral icterus.  Conjunctiva pink. Heart: Regular rate and rhythm, no murmurs. Pulm: Breath sounds coarse but clear throughout.  No wheezes, rhonchi or crackles. Abdomen: Soft, nontender.  Nondistended.  LUQ  with  G tube intact, small amount of dark red  drainage around tube site.  Extremities:  Without edema.   Neurologic:  Alert and  oriented x4;  grossly normal neurologically. Psych:  Alert and cooperative. Normal mood and affect.  Intake/Output from previous day: 01/26 0701 - 01/27 0700 In: 2414.8 [I.V.:53.6; NG/GT:2361.2] Out: 500 [Urine:500] Intake/Output this shift: No intake/output data recorded.  Lab Results: Recent Labs    05/13/21 0439 05/14/21 0536 05/15/21 0442  WBC 6.4 3.2* 4.3  HGB 7.9* 7.2* 7.4*  HCT 23.4* 22.2* 23.4*  PLT 351 347 348   BMET Recent Labs    05/13/21 0439 05/14/21 0438 05/15/21 0442  NA 133* 128* 128*  K 4.8 5.2* 5.3*  CL 100 98 99  CO2 26 22 21*  GLUCOSE 103* 225* 201*  BUN 26* 28* 35*  CREATININE 0.76 0.78 0.77  CALCIUM 8.8* 8.8* 8.4*   LFT Recent Labs    05/13/21 0439  ALBUMIN 2.5*   PT/INR No results for input(s): LABPROT, INR in the last 72 hours. Hepatitis Panel No results for input(s): HEPBSAG, HCVAB, HEPAIGM, HEPBIGM in the last 72 hours.  DG Abd 1 View  Result Date: 05/14/2021 CLINICAL DATA:  Nausea and vomiting EXAM: ABDOMEN - 1 VIEW COMPARISON:  CT abdomen pelvis 04/17/2021 FINDINGS: Nonobstructive bowel gas pattern. There is a gastrostomy tube overlying the stomach. Right upper quadrant surgical clips. No calcifications overlie the kidneys or course of the ureters. IMPRESSION: No evidence of bowel obstruction. Electronically Signed   By: Maurine Simmering M.D.   On: 05/14/2021 15:21   CT HEAD WO CONTRAST (5MM)  Result Date: 05/14/2021 CLINICAL DATA:  Dizziness EXAM: CT HEAD WITHOUT CONTRAST TECHNIQUE: Contiguous axial images were obtained from the base of the skull through the vertex without intravenous contrast. RADIATION DOSE REDUCTION: This exam was performed according to the departmental dose-optimization program which includes automated exposure control, adjustment of the mA and/or kV according to patient size and/or use of iterative reconstruction technique. COMPARISON:   None. FINDINGS: Brain: There is no mass, hemorrhage or extra-axial collection. There is generalized atrophy without lobar predilection. Hypodensity of the white matter is most commonly associated with chronic microvascular disease. Vascular: No abnormal hyperdensity of the major intracranial arteries or dural venous sinuses. No intracranial atherosclerosis. Skull: The visualized skull base, calvarium and extracranial soft tissues are normal. Sinuses/Orbits: No fluid levels or advanced mucosal thickening of the visualized paranasal sinuses. No mastoid or middle ear effusion. The orbits are normal. IMPRESSION: 1. No acute intracranial abnormality. 2. Chronic microvascular ischemia and generalized atrophy. Electronically Signed   By: Ulyses Jarred M.D.   On: 05/14/2021 19:51   DG CHEST PORT 1 VIEW  Result Date: 05/14/2021 CLINICAL DATA:  Difficulty breathing, vomiting EXAM: PORTABLE CHEST 1 VIEW COMPARISON:  05/07/2021 FINDINGS: Transverse diameter of heart is increased. Central pulmonary vessels are less prominent. There is interval decrease in bilateral pleural effusions. There are no new focal infiltrates or signs of alveolar pulmonary edema. Small linear density in the right parahilar region may suggest minimal subsegmental atelectasis. IMPRESSION: There is interval decrease in pulmonary vascular congestion and decrease in bilateral pleural effusions. Small bilateral pleural effusions are seen. Cardiomegaly. Electronically Signed   By: Elmer Picker M.D.   On: 05/14/2021 15:24  Assessment / Plan:  80) 87 year old white female with history of paraesophageal hernia and chronic organoaxial volvulus status post laparoscopic repair January 2022 with recurrent N/V and esophageal dysphagia. Barium swallow showed a mild peptic stricture in the distal esophagus near the GE junction. S/P G tube placement on 05/11/2021 on continuous tube feedings. N/V with recurrent esophageal dysphagia. On Octreotide infusion  and Robinul to reduce oral secretions and N/V.  -NPO after midnight -Hold Tube feedings after midnight  -EGD will esophageal dilatation tomorrow with Dr. Ardis Hughs, benefits and risks discussed including risk with sedation, risk of bleeding, perforation and infection  -Hold Lovenox tomorrow am -Continue Omeprazole 20 mg p.o. twice daily and Famotidine 20 mg IV every 24 hours -Further recommendation to be determined after EGD completed  2) Acute on chronic macrocytic anemia. No overt GI bleeding. Hg 7.4. MCV 102.2. B12 224. -Defer vitamin B12 replacement per the hospitalist  3) Syncopal episodes resulting in hospital admission 04/30/2021, found in torsades, received CPR without defibrillation then afib with RVR, converted after IV Amiodarone. Transitioned to oral Amiodarone. Evaluated by cardiology, arrhythmias likely due to hypokalemia.  Her coagulation was not required.  4) Diastolic CHF. Echo showed LVEF 60 to 65%.  5) COPD, stable. Currently not using oxygen [place].  6) Acute on chronic anemia. No overt GI bleeding.  7) Dizziness. Change in mental status yesterday. Head CT 05/14/2021 showed no acute intracranial abnormality and chronic microvascular ischemia and generalized atrophy. Today, her family, RN and Dr. Erlinda Hong reported patient is much more awake and alert today. She tolerated physical therapy quite well today, appeared fatigued when I saw her.   8) Hyponatremia       LOS: 15 days   Noralyn Pick  05/15/2021, 2:29 PM  _______________________________________________________________________________________________________________________________________  Velora Heckler GI MD note:  Prior to my entering the room Patric had a conversation with Dr. Hilma Favors and has decided against any further interventional procedures. We are available if she changes her mind but will sign off for now.   Owens Loffler, MD [facility] Pager [phone]

## 2021-05-15 NOTE — Progress Notes (Signed)
Physical Therapy Treatment Patient Details Name: [name] MRN: [id number] DOB: 1934-08-03 Today's Date: 05/15/2021   History of Present Illness 87 year old F with PMH of hiatal hernia status post repair, dysphagia, GERD, CHF, PE not on AC, chronic hypoxic RF on 2 L at bedtime, cognitive impairment and venous insufficiency brought to ED by EMS after an episode of 3-second LOC after coughing spell followed by generalized weakness. Admitted for syncope and collapse. While in ED, CODE BLUE called when patient became pale and slumped over but no LOC.  She had brief CPR.  No VT, VF or asystole on monitor.  She later developed A. fib with RVR and had 20 beats of nonsustained VT.  PEG tube placed by IR 1/23    PT Comments    Pt attempting to assist more today with mobility and tolerated sitting EOB and brushing teeth.  Pt then attempted standing x2 however fatigued quickly.  Pt continues to report dizziness and pain however appears better tolerated today then previous sessions.  Pt assisted back to supine end of session and repositioned with pillows for comfort.     Recommendations for follow up therapy are one component of a multi-disciplinary discharge planning process, led by the attending physician.  Recommendations may be updated based on patient status, additional functional criteria and insurance authorization.  Follow Up Recommendations  Skilled nursing-short term rehab (<3 hours/day)     Assistance Recommended at Discharge Frequent or constant Supervision/Assistance  Patient can return home with the following Two people to help with walking and/or transfers;A lot of help with bathing/dressing/bathroom;Assistance with cooking/housework;Direct supervision/assist for medications management;Assist for transportation;Assistance with feeding;Direct supervision/assist for financial management;Help with stairs or ramp for entrance   Equipment Recommendations  None recommended by PT     Recommendations for Other Services       Precautions / Restrictions Precautions Precautions: Fall Precaution Comments: sternum and across to right chest wall painful, PEG     Mobility  Bed Mobility Overal bed mobility: Needs Assistance Bed Mobility: Rolling, Sidelying to Sit, Sit to Supine Rolling: Mod assist   Supine to sit: Mod assist, HOB elevated Sit to supine: +2 for safety/equipment, Max assist, +2 for physical assistance   General bed mobility comments: pt attempting to assist more today with getting to EOB,  cues for hand placement to self assist; required assist for upper body upright and scootoing to EOB; pt very fatigued and required more assist for return to bed    Transfers Overall transfer level: Needs assistance Equipment used: Rolling walker (2 wheels) Transfers: Sit to/from Stand Sit to Stand: Max assist, +2 physical assistance, From elevated surface           General transfer comment: utilized bed pad to assist with boosting pt's hips/bottom upright, pt unable to fully stand upright until second attempt however fatigued quickly    Ambulation/Gait                   Stairs             Wheelchair Mobility    Modified Rankin (Stroke Patients Only)       Balance Overall balance assessment: Needs assistance Sitting-balance support: Feet supported, Single extremity supported Sitting balance-Leahy Scale: Poor Sitting balance - Comments: pt requiring UE support or occasional min assist with trunk with fatigue/performing ADL (OT had pt brush teeth at EOB)  Cognition Arousal/Alertness: Awake/alert Behavior During Therapy: Flat affect Overall Cognitive Status: Within Functional Limits for tasks assessed                                 General Comments: more alert/awake today compared to last session        Exercises      General Comments        Pertinent  Vitals/Pain Pain Assessment Pain Assessment: Faces Faces Pain Scale: Hurts little more Pain Location: sternum, PEG location Pain Descriptors / Indicators: Grimacing, Guarding, Tender, Sore Pain Intervention(s): Repositioned, Monitored during session    Home Living                          Prior Function            PT Goals (current goals can now be found in the care plan section) Progress towards PT goals: Progressing toward goals    Frequency    Min 2X/week      PT Plan Current plan remains appropriate    Co-evaluation PT/OT/SLP Co-Evaluation/Treatment: Yes Reason for Co-Treatment: Complexity of the patient's impairments (multi-system involvement);To address functional/ADL transfers PT goals addressed during session: Mobility/safety with mobility OT goals addressed during session: ADL's and self-care      AM-PAC PT "6 Clicks" Mobility   Outcome Measure  Help needed turning from your back to your side while in a flat bed without using bedrails?: Total Help needed moving from lying on your back to sitting on the side of a flat bed without using bedrails?: Total Help needed moving to and from a bed to a chair (including a wheelchair)?: Total Help needed standing up from a chair using your arms (e.g., wheelchair or bedside chair)?: Total Help needed to walk in hospital room?: Total Help needed climbing 3-5 steps with a railing? : Total 6 Click Score: 6    End of Session Equipment Utilized During Treatment: Gait belt Activity Tolerance: Patient limited by fatigue;Patient limited by pain Patient left: in bed;with call bell/phone within reach (repositioned with pillows) Nurse Communication: Mobility status PT Visit Diagnosis: Unsteadiness on feet (R26.81);Difficulty in walking, not elsewhere classified (R26.2)     Time: 1137-1200 PT Time Calculation (min) (ACUTE ONLY): 23 min  Charges:  $Therapeutic Activity: 8-22 mins           Jannette Spanner PT, DPT Acute  Rehabilitation Services Pager: [phone] Office: [place] 05/15/2021, 1:45 PM

## 2021-05-15 NOTE — Progress Notes (Addendum)
Inpatient Diabetes Program Recommendations  AACE/ADA: New Consensus Statement on Inpatient Glycemic Control (2015)  Target Ranges:  Prepandial:   less than 140 mg/dL      Peak postprandial:   less than 180 mg/dL (1-2 hours)      Critically ill patients:  140 - 180 mg/dL   Lab Results  Component Value Date   GLUCAP 138 (H) 05/15/2021   HGBA1C 5.4 05/14/2021    Review of Glycemic Control  Latest Reference Range & Units 05/14/21 04:09 05/14/21 07:45 05/14/21 12:01 05/14/21 16:55 05/14/21 20:17 05/14/21 23:59 05/15/21 04:00 05/15/21 07:19  Glucose-Capillary 70 - 99 mg/dL 258 (H) 205 (H) 156 (H) 263 (H) 275 (H) 271 (H) 215 (H) 138 (H)  (H): Data is abnormally high   Current orders for Inpatient glycemic control: Novolog 0-9 units Q4H, Osmolite @50  ml/hr  Inpatient Diabetes Program Recommendations:    Novolog 2 units Q4H tube feed coverage.  Hold if feeds ar held or discontinued.   Will continue to follow while inpatient.  Thank you, Reche Dixon, MSN, RN Diabetes Coordinator Inpatient Diabetes Program [phone] (team pager from 8a-5p)

## 2021-05-15 NOTE — Progress Notes (Signed)
Occupational Therapy Treatment Patient Details Name: [name] MRN: [id number] DOB: 09/05/1934 Today's Date: 05/15/2021   History of present illness 87 year old F with PMH of hiatal hernia status post repair, dysphagia, GERD, CHF, PE not on AC, chronic hypoxic RF on 2 L at bedtime, cognitive impairment and venous insufficiency brought to ED by EMS after an episode of 3-second LOC after coughing spell followed by generalized weakness. Admitted for syncope and collapse. While in ED, CODE BLUE called when patient became pale and slumped over but no LOC.  She had brief CPR.  No VT, VF or asystole on monitor.  She later developed A. fib with RVR and had 20 beats of nonsustained VT.  PEG tube placed by IR 1/23   OT comments  Treatment focused on sitting edge of bed and performing ADL for generalized strengthening and activity tolerance. Patient able to brush teeth at side of bed. Patient able to perform sit to stand x 2 but unable to take steps. Limited by pain and weakness in standing. Patient improved today with brighter affect and participation. Cont POC.   Recommendations for follow up therapy are one component of a multi-disciplinary discharge planning process, led by the attending physician.  Recommendations may be updated based on patient status, additional functional criteria and insurance authorization.    Follow Up Recommendations  Skilled nursing-short term rehab (<3 hours/day)    Assistance Recommended at Discharge Frequent or constant Supervision/Assistance  Patient can return home with the following  A lot of help with bathing/dressing/bathroom;Assistance with cooking/housework;Two people to help with walking and/or transfers   Equipment Recommendations   (efer to next venue)    Recommendations for Other Services      Precautions / Restrictions Precautions Precautions: Fall Precaution Comments: sternum and across to right chest wall painful, PEG Restrictions Weight Bearing  Restrictions: No          Balance Overall balance assessment: Needs assistance Sitting-balance support: No upper extremity supported, Feet supported Sitting balance-Leahy Scale: Fair       Standing balance-Leahy Scale: Poor Standing balance comment: reliant on external assistance                           ADL either performed or assessed with clinical judgement   ADL Overall ADL's : Needs assistance/impaired     Grooming: Sitting Grooming Details (indicate cue type and reason): at side of bed patient washed face and brushed teeth.                               General ADL Comments: Patient mod assist to transfer to side of bed and patient actively initiating movement. Patient able to sit at edge of bed to perform grooming task and extended amount of time for therapist to gather needed items. Performed sit to stand x 2 - unable to fully come into standing the first attempt but did better with second. Not able to take an actual step as patient weak and in pain and unable to effectively use upper extremities on walker.      Cognition Arousal/Alertness: Awake/alert Behavior During Therapy: WFL for tasks assessed/performed Overall Cognitive Status: Within Functional Limits for tasks assessed                                 General Comments: Patient has brighter affect  today. Alert and able to answer questions appropriately.                   Pertinent Vitals/ Pain       Pain Assessment Pain Assessment: Faces Faces Pain Scale: Hurts even more Pain Location: sternum, PEG location Pain Descriptors / Indicators: Grimacing, Guarding, Tender, Sore Pain Intervention(s): Limited activity within patient's tolerance   Frequency  Min 2X/week        Progress Toward Goals  OT Goals(current goals can now be found in the care plan section)  Progress towards OT goals: Progressing toward goals  Acute Rehab OT Goals Patient Stated Goal:  get stronger and go to rehab OT Goal Formulation: With patient Time For Goal Achievement: 05/29/21 Potential to Achieve Goals: Good  Plan Discharge plan remains appropriate    Co-evaluation    PT/OT/SLP Co-Evaluation/Treatment: Yes Reason for Co-Treatment: Complexity of the patient's impairments (multi-system involvement);To address functional/ADL transfers PT goals addressed during session: Mobility/safety with mobility OT goals addressed during session: ADL's and self-care      AM-PAC OT "6 Clicks" Daily Activity     Outcome Measure   Help from another person eating meals?: A Little Help from another person taking care of personal grooming?: A Little Help from another person toileting, which includes using toliet, bedpan, or urinal?: Total Help from another person bathing (including washing, rinsing, drying)?: A Lot Help from another person to put on and taking off regular upper body clothing?: A Lot Help from another person to put on and taking off regular lower body clothing?: Total 6 Click Score: 12    End of Session Equipment Utilized During Treatment: Rolling walker (2 wheels);Gait belt  OT Visit Diagnosis: Muscle weakness (generalized) (M62.81);Pain   Activity Tolerance Patient limited by pain   Patient Left in bed;with call bell/phone within reach;with bed alarm set   Nurse Communication Mobility status        Time: 1137-1200 OT Time Calculation (min): 23 min  Charges: OT General Charges $OT Visit: 1 Visit OT Treatments $Self Care/Home Management : 8-22 mins  Derl Barrow, OTR/L [place]  Office [phone] Pager: [place] 05/15/2021, 1:55 PM

## 2021-05-15 NOTE — Progress Notes (Signed)
PROGRESS NOTE    [name]  UDJ:[id number] DOB: 07-05-1934 DOA: 04/30/2021 PCP: Aretta Nip, MD    Chief Complaint  Patient presents with   Syncope    Brief Narrative:  [name] is an 87 y.o. F with hiatal hernia, s/p repair 1 yr ago, c/b chronic post-op nausea, vomiting and anorexia, hx PE not on AC, HTN, dCHF, and asthma not on home O2 (follows with Dr. Valeta Harms) who presented with syncope.   In the ER, had a CODE BLUE, found to have VF/torsades and received CPR --> K 2.4     1/12: Admitted, cardiology consulted, treated with amiodarone gtt 1/16: transferred out of ICU 1/17 GI consulted for distal esophageal narrowing, ?PEG placement 1/23: PEG placed by IR 1/24-1/26: Severe nausea, palliative consulted 1/27 transition to full comfort measures, awaiting for residential hospice placement  Subjective:   She is transitioned to full comfort care today She appear weak, denies acute need, several family members at bedside Plan for residential hospice placement  Assessment & Plan:   Active Problems:   Hypertension   Hypokalemia   Asthma without acute exacerbation   Iron deficiency anemia   Chronic diastolic CHF (congestive heart failure) (HCC)   Dysphagia   Gastro-esophageal reflux disease without esophagitis   Acute lower UTI   Atrial fibrillation with RVR (HCC)   Pressure injury of skin   Ventricular fibrillation (HCC)   Vertigo and nausea   Hyponatremia  Dysphagia intractable nausea vomiting Seen by GI and palliative care -Transition to full comfort measures  V. Fib Initial presenting complaint, had torsades cardiac arrest in the waiting room of the ER, received CPR for less than 6 minutes, actually no defibrillation, and then ROSC and has been cognitively intact Seen by cardiology Was on amiodarone Now full comfort measures  A. fib RVR/chronic diastolic CHF Was on amiodarone Now on full comfort measures  E. coli UTI Finish antibiotic  treatment   Body mass index is 28.41 kg/m.Marland Kitchen Nutrition Status: Nutrition Problem: Inadequate oral intake Etiology: inability to eat Signs/Symptoms: NPO status Interventions: Tube feeding Now on full comfort measures  .     Skin Assessment:  I have examined the patients skin and I agree with the wound assessment as performed by the wound care RN as outlined below:  Pressure Injury 05/01/21 Sacrum Mid Stage 1 -  Intact skin with non-blanchable redness of a localized area usually over a bony prominence. Area of open, pink skin (Active)  05/01/21 1500  Location: Sacrum  Location Orientation: Mid  Staging: Stage 1 -  Intact skin with non-blanchable redness of a localized area usually over a bony prominence.  Wound Description (Comments): Area of open, pink skin  Present on Admission: Yes    Unresulted Labs (From admission, onward)    None                     Anticipated d/c is to: Residential hospice    Objective: Vitals:   05/15/21 0405 05/15/21 0457 05/15/21 0843 05/15/21 1419  BP: (!) 180/62   (!) 178/70  Pulse: (!) 57   61  Resp: 17   20  Temp: 97.8 F (36.6 C)   97.8 F (36.6 C)  TempSrc: Oral   Oral  SpO2: 97%  97% 96%  Weight:  68.2 kg    Height:        Intake/Output Summary (Last 24 hours) at 05/15/2021 2005 Last data filed at 05/15/2021 1500 Gross per 24 hour  Intake 3064.79 ml  Output 0 ml  Net 3064.79 ml   Filed Weights   05/12/21 0537 05/14/21 0412 05/15/21 0457  Weight: 66.7 kg 66.7 kg 68.2 kg    Examination:  General exam: frail , alert, awake, communicative, NAD Respiratory system:  Respiratory effort normal. .     Data Reviewed: I have personally reviewed following labs and imaging studies  CBC: Recent Labs  Lab 05/11/21 0441 05/13/21 0439 05/14/21 0536 05/15/21 0442  WBC 5.8 6.4 3.2* 4.3  HGB 7.2* 7.9* 7.2* 7.4*  HCT 21.3* 23.4* 22.2* 23.4*  MCV 97.3 98.7 99.6 102.2*  PLT 316 351 347 240    Basic Metabolic  Panel: Recent Labs  Lab 05/11/21 0441 05/12/21 0452 05/13/21 0439 05/14/21 0438 05/15/21 0442  NA 130* 130* 133* 128* 128*  K 3.4* 3.2* 4.8 5.2* 5.3*  CL 95* 95* 100 98 99  CO2 28 29 26 22  21*  GLUCOSE 95 110* 103* 225* 201*  BUN 30* 30* 26* 28* 35*  CREATININE 1.13* 1.01* 0.76 0.78 0.77  CALCIUM 8.0* 8.2* 8.8* 8.8* 8.4*  MG 1.7  --  2.4 2.0 2.1  PHOS  --  3.7 2.8 2.5 2.6    GFR: Estimated Creatinine Clearance: 46.3 mL/min (by C-G formula based on SCr of 0.77 mg/dL).  Liver Function Tests: Recent Labs  Lab 05/10/21 0430 05/11/21 0441 05/12/21 0452 05/13/21 0439  AST 18 17  --   --   ALT 15 15  --   --   ALKPHOS 91 91  --   --   BILITOT 0.9 1.0  --   --   PROT 4.9* 5.0*  --   --   ALBUMIN 2.4* 2.4* 2.3* 2.5*    CBG: Recent Labs  Lab 05/14/21 2359 05/15/21 0400 05/15/21 0719 05/15/21 1208 05/15/21 1647  GLUCAP 271* 215* 138* 178* 172*     No results found for this or any previous visit (from the past 240 hour(s)).       Radiology Studies: DG Abd 1 View  Result Date: 05/14/2021 CLINICAL DATA:  Nausea and vomiting EXAM: ABDOMEN - 1 VIEW COMPARISON:  CT abdomen pelvis 04/17/2021 FINDINGS: Nonobstructive bowel gas pattern. There is a gastrostomy tube overlying the stomach. Right upper quadrant surgical clips. No calcifications overlie the kidneys or course of the ureters. IMPRESSION: No evidence of bowel obstruction. Electronically Signed   By: Maurine Simmering M.D.   On: 05/14/2021 15:21   CT HEAD WO CONTRAST (5MM)  Result Date: 05/14/2021 CLINICAL DATA:  Dizziness EXAM: CT HEAD WITHOUT CONTRAST TECHNIQUE: Contiguous axial images were obtained from the base of the skull through the vertex without intravenous contrast. RADIATION DOSE REDUCTION: This exam was performed according to the departmental dose-optimization program which includes automated exposure control, adjustment of the mA and/or kV according to patient size and/or use of iterative reconstruction  technique. COMPARISON:  None. FINDINGS: Brain: There is no mass, hemorrhage or extra-axial collection. There is generalized atrophy without lobar predilection. Hypodensity of the white matter is most commonly associated with chronic microvascular disease. Vascular: No abnormal hyperdensity of the major intracranial arteries or dural venous sinuses. No intracranial atherosclerosis. Skull: The visualized skull base, calvarium and extracranial soft tissues are normal. Sinuses/Orbits: No fluid levels or advanced mucosal thickening of the visualized paranasal sinuses. No mastoid or middle ear effusion. The orbits are normal. IMPRESSION: 1. No acute intracranial abnormality. 2. Chronic microvascular ischemia and generalized atrophy. Electronically Signed   By: Cletus Gash.D.  On: 05/14/2021 19:51   DG CHEST PORT 1 VIEW  Result Date: 05/14/2021 CLINICAL DATA:  Difficulty breathing, vomiting EXAM: PORTABLE CHEST 1 VIEW COMPARISON:  05/07/2021 FINDINGS: Transverse diameter of heart is increased. Central pulmonary vessels are less prominent. There is interval decrease in bilateral pleural effusions. There are no new focal infiltrates or signs of alveolar pulmonary edema. Small linear density in the right parahilar region may suggest minimal subsegmental atelectasis. IMPRESSION: There is interval decrease in pulmonary vascular congestion and decrease in bilateral pleural effusions. Small bilateral pleural effusions are seen. Cardiomegaly. Electronically Signed   By: Elmer Picker M.D.   On: 05/14/2021 15:24        Scheduled Meds:  acetaminophen  1,000 mg Per Tube TID   chlorhexidine  15 mL Mouth Rinse BID   dexamethasone (DECADRON) injection  4 mg Intravenous Daily   famotidine  20 mg Per Tube Daily   fluticasone furoate-vilanterol  1 puff Inhalation Daily   furosemide  20 mg Oral Daily   [START ON 05/16/2021] glycopyrrolate  0.1 mg Intravenous BID   metoCLOPramide (REGLAN) injection  10 mg  Intravenous Q8H   neomycin-bacitracin-polymyxin  1 application Topical Daily   omeprazole  20 mg Oral BID AC   ondansetron (ZOFRAN) IV  4 mg Intravenous Q4H   senna-docusate  2 tablet Per Tube BID   Continuous Infusions:  feeding supplement (OSMOLITE 1.5 CAL) 1,000 mL (05/14/21 1158)     LOS: 15 days    Voice Recognition /Dragon dictation system was used to create this note, attempts have been made to correct errors. Please contact the author with questions and/or clarifications.   Florencia Reasons, MD PhD FACP [facility]  Available via Epic secure chat 7am-7pm for nonurgent issues Please page for urgent issues To page the attending provider between 7A-7P or the covering provider during after hours 7P-7A, please log into the web site [email or web address] and access using universal [place] password for that web site. If you do not have the password, please call the hospital operator.    05/15/2021, 8:05 PM

## 2021-05-15 NOTE — Plan of Care (Signed)

## 2021-05-16 DIAGNOSIS — I4901 Ventricular fibrillation: Principal | ICD-10-CM

## 2021-05-16 DIAGNOSIS — I472 Ventricular tachycardia, unspecified: Secondary | ICD-10-CM

## 2021-05-16 SURGERY — ESOPHAGOGASTRODUODENOSCOPY (EGD) WITH PROPOFOL
Anesthesia: Monitor Anesthesia Care

## 2021-05-16 NOTE — Progress Notes (Signed)
Notified on call provider about patient's elevated BP of 183/62. Gave PRN hydralazine, which helped decrease the BP but was still elevated with a BP of 174/61.

## 2021-05-16 NOTE — TOC Transition Note (Signed)
Transition of Care [facility]) - CM/SW Discharge Note   Patient Details  Name: [name] MRN: [id number] Date of Birth: 12-14-1934  [facility] CM/SW Contact:  Ross Ludwig, LCSW Phone Number: 05/16/2021, 2:51 PM   Clinical Narrative:     CSW was informed that [facility]'s system is back up, and they can accept patient today.  CSW updated bedside nurse and attending physician.  Patient to be d/c'ed today to [facility].  Patient and family agreeable to plans will transport via ems RN to call report to [phone].     Final next level of care: [place] Barriers to Discharge: Barriers Resolved   Patient Goals and CMS Choice Patient states their goals for this hospitalization and ongoing recovery are:: To [facility] to [facility] for [facility] life care CMS Medicare.gov Compare Post Acute Care list provided to:: Patient Represenative (must comment) Choice offered to / list presented to : Spouse  Discharge Placement                       Discharge Plan and Services                                     Social Determinants of Health (SDOH) Interventions     Readmission Risk Interventions No flowsheet data found.

## 2021-05-16 NOTE — Progress Notes (Addendum)
Palliative Care Progress Note  I saw [name] this AM and she appeared to be doing better in terms of her symptoms and was actively engaged in a discussion about her goals of care. We discussed the difficulty she has had over the past year, her current condition and possible trajectories of her illness. She continues to have almost daily persistent nausea and vertigo and is extremely deconditioned. Her symptom management is challenging due to her prolonged QT and previous torsdaes and she has pain in her chest from receiving chest compressions. We explored deeply the path of ongoing aggressive interventions and therapy vs. a comfort care approach. If she and her family decide to continue on going aggressive interventions I recommended getting GI involved again because it will be nearly impossible to control her NV without an intervention for distal esophageal stricture/obstruction.  Around 4PM I received a call directly from [name] stating that she wanted to discuss her care. [name] very clearly stated a desire to transition to comfort care and to not pursue any additional aggressive medical interventions. She had very specific questions about what would be involved in a comfort care transition. She told me that she could no longer endure the suffering she had been through for the past year, that she did not want have her life prolonged in this condition. She also did not think that she would be able to regain her ability to walk or care for herself and did not want to be in a nursing home or be a burden on her family. She articulately thanked me for the opportunity to have this option and knows that her husband [name] is going to have the hardest time with her choice. The patient told me she planned on telling her family this afternoon that she wants hospice care.  Patient informed her family but her husband [name] was having a very difficult time with her decision and understanding why she had made this  choice. I met the patient along with husband, her daughter and a son at bedside to help support them and to make sure everyone understood the next steps with a comfort care transition. Her husband [name] was experiencing profound grief- [name] herself tried to comfort him and asked for him to allow her to be at peace.  Recommendations: DNR, comfort measures only Discontinue all non-essential medications including artifical feeding and hydration Administer anti-nausea medications scheduled ad PRN Pain control and sedation scheduled and as needed. Given high symptom management needs and complexity of her care I recommend transfer to a hospice facility. Prognosis is <2 week most likely Spiritual care consultation  Lane Hacker, DO Palliative Medicine   Time: 120 minutes

## 2021-05-16 NOTE — Discharge Summary (Signed)
Physician Discharge Summary  [name] JEH:[id number] DOB: 1935/03/24 DOA: 04/30/2021  PCP: Aretta Nip, MD  Admit date: 04/30/2021 Discharge date: 05/16/2021  Admitted From: home Disposition:  residential hospice  Discharge Condition: stable CODE STATUS: DNR Diet recommendation: comfort feeding  HPI: Per admitting MD,  [name] is a 87 y.o. female with medical history significant of hiatal hernia status post repair, history of PE, pleural effusion, venous insufficiency, memory loss, anemia, hypertension, GERD, diverticulosis, CHF, chronic respiratory failure who presents after episode of syncope at home. Patient was at home sitting and family noted patient had a 3-second loss of consciousness after a coughing spell.  Patient was weak for period time following this episode.  EMS was called to transfer patient to the ED for further evaluation. Patient also reports episodes of choking and vomiting after eating and taking medicine recently and has had some weight loss secondary to this.  She has been seen by GI who plan for outpatient swallow study considering her comorbidities and concern with starting with endoscopy.  Concern for possible stricture.  Hospital Course / Discharge diagnoses: Principal problem Cardiac arrest / V fib / torsades - on admission s/p CPR with ROCS. Cardiology consulted and followed. Was briefly on Amiodarone. Palliative care consulted and followed as well, eventually transitioned to comfort care, will be discharged to residential hospice  Active problems Dysphagia, intractable nausea / vomiting - she has a history of paraesophageal hernia and chronic organoaxial volvulus status post laparoscopic repair January 2022 with recurrent N/V and esophageal dysphagia. GI consulted and followed patient while hospitalized. Now has a PEG. She was supposed at get an EGD at one point, however now transitioned to comfort care. A fib RVR E coli  UTI HTN Hypokalemia Hyperkalemia Hyponatremia Macrocytic anemia Prolonged QT COPD Acute hypoxic respiratory failure  Recurrent aspiration  Sepsis ruled out  Discharge Instructions  Discharge Instructions     Amb referral to [facility]   Complete by: As directed       Allergies as of 05/16/2021       Reactions   Codeine Nausea And Vomiting   Erythromycin Nausea And Vomiting   Other Nausea And Vomiting, Other (See Comments)   Pain medications   Pantoprazole Diarrhea, Other (See Comments)   Stomach pain, also   Sulfamethoxazole Nausea And Vomiting   Tramadol Hcl Nausea And Vomiting   Amlodipine Cough   Ciprofibrate Rash, Other (See Comments)   Weakness, also   Metronidazole Rash, Other (See Comments)   Weakness, also        Medication List     STOP taking these medications    albuterol 108 (90 Base) MCG/ACT inhaler Commonly known as: VENTOLIN HFA   aspirin EC 81 MG tablet   CALCIUM 1200 PO   CLEAR EYES FOR DRY EYES OP   denosumab 60 MG/ML Sosy injection Commonly known as: PROLIA   ferrous sulfate 325 (65 FE) MG tablet   potassium chloride 10 MEQ tablet Commonly known as: KLOR-CON M   silver sulfADIAZINE 1 % cream Commonly known as: SILVADENE   valsartan-hydrochlorothiazide 80-12.5 MG tablet Commonly known as: DIOVAN-HCT   Vitamin D 50 MCG (2000 UT) tablet       TAKE these medications    Breo Ellipta 200-25 MCG/ACT Aepb Generic drug: fluticasone furoate-vilanterol Inhale 1 puff into the lungs daily.   metoCLOPramide 5 MG tablet Commonly known as: REGLAN Take 5 mg by mouth 3 (three) times daily before meals.   omeprazole 40  MG capsule Commonly known as: PRILOSEC Take 1 capsule (40 mg total) by mouth in the morning and at bedtime.   promethazine 25 MG tablet Commonly known as: PHENERGAN Take 25 mg by mouth every 12 (twelve) hours as needed for refractory nausea / vomiting.        Follow-up Information     Park Liter,  MD Follow up.   Specialty: Cardiology Why: Hospital follow-up with Cardiology scheduled for 06/25/2021 at 11:20am. Our office will also call you to arrange outpatient monitor. Contact information: [place] [address] [phone]                 Consultations: Cardiology  GI Palliative  Procedures/Studies:  DG Chest 2 View  Result Date: 04/30/2021 CLINICAL DATA:  Nausea and vomiting for several days EXAM: CHEST - 2 VIEW COMPARISON:  None. FINDINGS: Normal mediastinum and cardiac silhouette. Lungs are hyperinflated. Normal pulmonary vasculature. No evidence of effusion, infiltrate, or pneumothorax. No acute bony abnormality. IMPRESSION: Hyperinflated lungs.  No acute findings Electronically Signed   By: Suzy Bouchard M.D.   On: 04/30/2021 16:23   DG Chest 2 View  Result Date: 04/17/2021 CLINICAL DATA:  Chest pain, cough, congestion EXAM: CHEST - 2 VIEW COMPARISON:  07/08/2020 FINDINGS: Frontal and lateral views of the chest demonstrate a stable cardiac silhouette. No acute airspace disease, effusion, or pneumothorax. Scattered areas of bibasilar scarring. No acute bony abnormalities. IMPRESSION: 1. No acute intrathoracic process. Electronically Signed   By: Randa Ngo M.D.   On: 04/17/2021 18:54   DG Abd 1 View  Result Date: 05/14/2021 CLINICAL DATA:  Nausea and vomiting EXAM: ABDOMEN - 1 VIEW COMPARISON:  CT abdomen pelvis 04/17/2021 FINDINGS: Nonobstructive bowel gas pattern. There is a gastrostomy tube overlying the stomach. Right upper quadrant surgical clips. No calcifications overlie the kidneys or course of the ureters. IMPRESSION: No evidence of bowel obstruction. Electronically Signed   By: Maurine Simmering M.D.   On: 05/14/2021 15:21   CT HEAD WO CONTRAST (5MM)  Result Date: 05/14/2021 CLINICAL DATA:  Dizziness EXAM: CT HEAD WITHOUT CONTRAST TECHNIQUE: Contiguous axial images were obtained from the base of the skull through the vertex without  intravenous contrast. RADIATION DOSE REDUCTION: This exam was performed according to the departmental dose-optimization program which includes automated exposure control, adjustment of the mA and/or kV according to patient size and/or use of iterative reconstruction technique. COMPARISON:  None. FINDINGS: Brain: There is no mass, hemorrhage or extra-axial collection. There is generalized atrophy without lobar predilection. Hypodensity of the white matter is most commonly associated with chronic microvascular disease. Vascular: No abnormal hyperdensity of the major intracranial arteries or dural venous sinuses. No intracranial atherosclerosis. Skull: The visualized skull base, calvarium and extracranial soft tissues are normal. Sinuses/Orbits: No fluid levels or advanced mucosal thickening of the visualized paranasal sinuses. No mastoid or middle ear effusion. The orbits are normal. IMPRESSION: 1. No acute intracranial abnormality. 2. Chronic microvascular ischemia and generalized atrophy. Electronically Signed   By: Ulyses Jarred M.D.   On: 05/14/2021 19:51   CT Angio Chest PE W and/or Wo Contrast  Result Date: 04/17/2021 CLINICAL DATA:  Lower chest pain with nausea, vomiting and shortness of breath. EXAM: CT ANGIOGRAPHY CHEST WITH CONTRAST TECHNIQUE: Multidetector CT imaging of the chest was performed using the standard protocol during bolus administration of intravenous contrast. Multiplanar CT image reconstructions and MIPs were obtained to evaluate the vascular anatomy. CONTRAST:  152mL OMNIPAQUE IOHEXOL 350 MG/ML SOLN  COMPARISON:  April 24, 2020 FINDINGS: Cardiovascular: There is moderate severity calcification the aortic arch, without evidence of aortic aneurysm or dissection. There is limited evaluation of the subsegmental pulmonary arteries secondary to suboptimal opacification with intravenous contrast. No evidence of pulmonary embolism. Normal heart size. No pericardial effusion. Mediastinum/Nodes:  No enlarged mediastinal, hilar, or axillary lymph nodes. Multiple stable, previously evaluated cystic appearing areas are seen within the right and left lobes of the thyroid gland. The largest measures approximately 1.6 cm in diameter). The trachea and esophagus demonstrate no significant findings. Lungs/Pleura: Mild diffuse interstitial thickening is seen throughout both lungs. Mild atelectasis is seen within the bilateral lung bases, left slightly greater than right. There is no evidence of a pleural effusion or pneumothorax. Upper Abdomen: There is a small hiatal hernia with evidence of interval surgical repair of the large hiatal hernia noted on the prior study. A 1.3 cm diameter ill-defined area of parenchymal low attenuation is seen within the posterolateral aspect of the right lobe of the liver. Multiple surgical clips are seen within the gallbladder fossa. The common bile duct is dilated and measures approximately 1.2 cm in diameter. Musculoskeletal: No chest wall abnormality. No acute or significant osseous findings. Review of the MIP images confirms the above findings. IMPRESSION: 1. Limited evaluation of the subsegmental pulmonary arteries, without evidence of pulmonary embolism. 2. Mild diffuse interstitial thickening throughout both lungs, which may represent sequelae associated with mild interstitial edema 3. Evidence of interval surgical repair of the large hiatal hernia noted on the prior study. 4. Findings which may represent a small hepatic hemangioma. Correlation with nonemergent hepatic ultrasound is recommended 5. Evidence of prior cholecystectomy. 6. Aortic atherosclerosis. Aortic Atherosclerosis (ICD10-I70.0). Electronically Signed   By: Virgina Norfolk M.D.   On: 04/17/2021 21:23   CT ABDOMEN PELVIS W CONTRAST  Result Date: 04/17/2021 CLINICAL DATA:  Acute nonlocalized abdominal pain, nausea, vomiting, elevated liver function tests. EXAM: CT ABDOMEN AND PELVIS WITH CONTRAST TECHNIQUE:  Multidetector CT imaging of the abdomen and pelvis was performed using the standard protocol following bolus administration of intravenous contrast. CONTRAST:  155mL OMNIPAQUE IOHEXOL 350 MG/ML SOLN COMPARISON:  12/12/2016 FINDINGS: Lower chest: Mild bibasilar pulmonary fibrotic changes noted minimal coronary artery calcification. Global cardiac size is within normal limits. Interval diaphragmatic hernia repair has been performed. Small recurrent or residual hiatal hernia is present. Hepatobiliary: Cholecystectomy has been performed. The extrahepatic bile duct is dilated, measuring 14 mm in greatest diameter, but appears stable in size since prior examination, likely representing post cholecystectomy change. No intrahepatic biliary ductal dilation. Trace pneumobilia is present suggesting prior sphincterotomy. 19 mm cystic lesion is seen within the inferior right hepatic lobe, enlarged in size since prior examination where this measured 11 mm. This may represent a simple or complex hepatic cyst or a cystic neoplasm such as a biliary cystadenoma. The liver is otherwise unremarkable. Pancreas: Unremarkable Spleen: Unremarkable Adrenals/Urinary Tract: Adrenal glands are unremarkable. Kidneys are normal, without renal calculi, focal lesion, or hydronephrosis. Bladder is unremarkable. Stomach/Bowel: Severe sigmoid diverticulosis. The stomach, small bowel, and large bowel are otherwise unremarkable. No evidence of obstruction or focal inflammation. Appendix normal. No free intraperitoneal gas or fluid. Vascular/Lymphatic: Aortic atherosclerosis. No enlarged abdominal or pelvic lymph nodes. Reproductive: Status post hysterectomy. No adnexal masses. Other: No abdominal wall hernia. Musculoskeletal: No acute bone abnormality. Asymmetric right hip degenerative arthritis has progressed in the interval since prior examination with a small right base effusion now present. Degenerative changes are seen within the lumbar spine.  No  lytic or blastic bone lesion. IMPRESSION: Status post cholecystectomy. Stable dilation of the extrahepatic bile duct likely representing post cholecystectomy change. Punctate foci of a pneumobilia again identified suggesting changes of prior sphincterotomy. Enlarging, 19 mm cystic lesion within the right hepatic lobe. While nonspecific, it's relatively slow rate of growth favors a nonaggressive etiology. Sigmoid diverticulosis without superimposed acute inflammatory change. Interval development of asymmetric right hip degenerative arthritis now with small right hip effusion identified. While this may be simply related to advanced asymmetric degenerative change, a superimposed infectious or inflammatory process could appear similarly. Electronically Signed   By: Fidela Salisbury M.D.   On: 04/17/2021 21:16   IR GASTROSTOMY TUBE MOD SED  Result Date: 05/11/2021 INDICATION: 87 year old female referred for percutaneous gastrostomy EXAM: PERC PLACEMENT GASTROSTOMY MEDICATIONS: 2 g Ancef; Antibiotics were administered within 1 hour of the procedure. ANESTHESIA/SEDATION: Versed 1.0 mg IV; Fentanyl 25 mcg IV Moderate Sedation Time:  13 minutes The patient was continuously monitored during the procedure by the interventional radiology nurse under my direct supervision. CONTRAST:  10 cc-administered into the gastric lumen. FLUOROSCOPY TIME:  Fluoroscopy Time: 5 minutes 12 seconds (32 mGy). COMPLICATIONS: None PROCEDURE: Informed written consent was obtained from the patient and the patient's family after a thorough discussion of the procedural risks, benefits and alternatives. All questions were addressed. Maximal Sterile Barrier Technique was utilized including caps, mask, sterile gowns, sterile gloves, sterile drape, hand hygiene and skin antiseptic. A timeout was performed prior to the initiation of the procedure. The epigastrium was prepped with Betadine in a sterile fashion, and a sterile drape was applied covering the  operative field. A sterile gown and sterile gloves were used for the procedure. A 5-French orogastric tube is placed under fluoroscopic guidance. Scout imaging of the abdomen confirms barium within the transverse colon. The stomach was distended with gas. Under fluoroscopic guidance, an 18 gauge needle was utilized to puncture the anterior wall of the body of the stomach. An Amplatz wire was advanced through the needle passing a T fastener into the lumen of the stomach. The T fastener was secured for gastropexy. A 9-French sheath was inserted. A snare was advanced through the 9-French sheath. A Britta Mccreedy was advanced through the orogastric tube. It was snared then pulled out the oral cavity, pulling the snare, as well. The leading edge of the gastrostomy was attached to the snare. It was then pulled down the esophagus and out the percutaneous site. Tube secured in place. Contrast was injected. Patient tolerated the procedure well and remained hemodynamically stable throughout. No complications were encountered and no significant blood loss encountered. IMPRESSION: Status post fluoroscopic placed percutaneous gastrostomy tube, with 20 [place] pull-through. Signed, Dulcy Fanny. Earleen Newport, DO [facility] Electronically Signed   By: Corrie Mckusick D.O.   On: 05/11/2021 13:16   DG CHEST PORT 1 VIEW  Result Date: 05/14/2021 CLINICAL DATA:  Difficulty breathing, vomiting EXAM: PORTABLE CHEST 1 VIEW COMPARISON:  05/07/2021 FINDINGS: Transverse diameter of heart is increased. Central pulmonary vessels are less prominent. There is interval decrease in bilateral pleural effusions. There are no new focal infiltrates or signs of alveolar pulmonary edema. Small linear density in the right parahilar region may suggest minimal subsegmental atelectasis. IMPRESSION: There is interval decrease in pulmonary vascular congestion and decrease in bilateral pleural effusions. Small  bilateral pleural effusions are seen. Cardiomegaly. Electronically Signed   By: Elmer Picker M.D.   On: 05/14/2021 15:24   DG CHEST PORT 1  VIEW  Result Date: 05/07/2021 CLINICAL DATA:  Pneumonia. EXAM: PORTABLE CHEST 1 VIEW COMPARISON:  05/03/2021 FINDINGS: The cardiac silhouette remains mildly enlarged. Aortic atherosclerosis is noted. Lung volumes are lower than on the prior study. A small left pleural effusion is unchanged. Mild left basilar opacity has mildly increased. There is new hazy opacity in the right lung base which partially obscures the right hemidiaphragm. No pneumothorax is identified. IMPRESSION: 1. Unchanged small left pleural effusion. New small right pleural effusion. 2. Increased bibasilar opacities which may reflect atelectasis or infection. Electronically Signed   By: Logan Bores M.D.   On: 05/07/2021 08:07   DG CHEST PORT 1 VIEW  Result Date: 05/03/2021 CLINICAL DATA:  Chest pain EXAM: PORTABLE CHEST 1 VIEW COMPARISON:  Previous studies including the examination of 04/30/2021 FINDINGS: Transverse diameter of heart is increased. There are no signs of pulmonary edema. There is improvement in aeration of left lower lung fields. No new focal pulmonary consolidation is seen. Small linear density in the right parahilar region may suggest minimal subsegmental atelectasis. There is blunting of left lateral CP angle. There is no pneumothorax. Left hemidiaphragm is elevated. IMPRESSION: There is interval improvement in aeration of left lower lung fields suggesting resolution of atelectasis/pneumonia. Small left pleural effusion. Minimal subsegmental atelectasis is seen in the right parahilar region. Electronically Signed   By: Elmer Picker M.D.   On: 05/03/2021 10:34   DG Chest Port 1 View  Result Date: 04/30/2021 CLINICAL DATA:  Chest pain.  No sub breath. EXAM: PORTABLE CHEST 1 VIEW COMPARISON:  Radiograph earlier today. FINDINGS: Lower lung volumes from prior exam. Stable  heart size and mediastinal contours. Tortuous thoracic aorta with atherosclerosis. Left pleural effusion is increasing left basilar opacity, likely atelectasis. Subsegmental atelectasis in the left mid lung. No pneumothorax. No pulmonary edema. IMPRESSION: 1. Increasing left pleural effusion and left basilar opacity, likely atelectasis. 2. Lower lung volumes from exam earlier this day. Electronically Signed   By: Keith Rake M.D.   On: 04/30/2021 22:08   ECHOCARDIOGRAM COMPLETE  Result Date: 05/01/2021    ECHOCARDIOGRAM REPORT   Patient Name:   KAILIN PRINCIPATO Date of Exam: 05/01/2021 Medical Rec #:  [id number]      Height:       61.0 in Accession #:    [id number]     Weight:       140.4 lb Date of Birth:  13-Nov-1934      BSA:          1.625 m Patient Age:    60 years       BP:           200/70 mmHg Patient Gender: F              HR:           70 bpm. Exam Location:  Inpatient Procedure: 2D Echo, Cardiac Doppler and Color Doppler Indications:    R55 Syncope  History:        Patient has prior history of Echocardiogram examinations, most                 recent 10/22/2020. Arrythmias:Atrial Fibrillation and Tachycardia,                 Signs/Symptoms:Dyspnea and Edema; Risk Factors:Hypertension.  Sonographer:    Glo Herring Referring Phys: [id number] [place]  1. Left ventricular ejection fraction, by estimation, is 60 to 65%. The left ventricle has normal function. The left  ventricle has no regional wall motion abnormalities. Left ventricular diastolic parameters are consistent with Grade I diastolic dysfunction (impaired relaxation).  2. Right ventricular systolic function is normal. The right ventricular size is normal.  3. The mitral valve is grossly normal. Mild mitral valve regurgitation.  4. The aortic valve is tricuspid. There is mild calcification of the aortic valve. There is mild thickening of the aortic valve. Aortic valve regurgitation is not visualized. Aortic valve  sclerosis/calcification is present, without any evidence of aortic stenosis.  5. Aortic dilatation noted. There is mild dilatation of the ascending aorta, measuring 37 mm.  6. The inferior vena cava is normal in size with greater than 50% respiratory variability, suggesting right atrial pressure of 3 mmHg. Comparison(s): Compared to prior TTE in 10/2020, there is no significant change. FINDINGS  Left Ventricle: Left ventricular ejection fraction, by estimation, is 60 to 65%. The left ventricle has normal function. The left ventricle has no regional wall motion abnormalities. The left ventricular internal cavity size was normal in size. There is  no left ventricular hypertrophy. Left ventricular diastolic parameters are consistent with Grade I diastolic dysfunction (impaired relaxation). Right Ventricle: The right ventricular size is normal. No increase in right ventricular wall thickness. Right ventricular systolic function is normal. Left Atrium: Left atrial size was normal in size. Right Atrium: Right atrial size was normal in size. Pericardium: There is no evidence of pericardial effusion. Mitral Valve: The mitral valve is grossly normal. Mild mitral valve regurgitation. Tricuspid Valve: The tricuspid valve is normal in structure. Tricuspid valve regurgitation is trivial. Aortic Valve: The aortic valve is tricuspid. There is mild calcification of the aortic valve. There is mild thickening of the aortic valve. Aortic valve regurgitation is not visualized. Aortic valve sclerosis/calcification is present, without any evidence of aortic stenosis. Aortic valve mean gradient measures 3.0 mmHg. Aortic valve peak gradient measures 6.2 mmHg. Pulmonic Valve: The pulmonic valve was normal in structure. Pulmonic valve regurgitation is not visualized. Aorta: Aortic dilatation noted. There is mild dilatation of the ascending aorta, measuring 37 mm. Venous: The inferior vena cava is normal in size with greater than 50%  respiratory variability, suggesting right atrial pressure of 3 mmHg. IAS/Shunts: The atrial septum is grossly normal.  LEFT VENTRICLE PLAX 2D LVIDd:         4.80 cm Diastology LVIDs:         2.70 cm LV e' medial:    7.40 cm/s LV PW:         1.00 cm LV E/e' medial:  11.4 LV IVS:        1.00 cm LV e' lateral:   8.05 cm/s                        LV E/e' lateral: 10.5  IVC IVC diam: 1.60 cm LEFT ATRIUM           Index LA diam:      3.90 cm 2.40 cm/m LA Vol (A2C): 38.1 ml 23.45 ml/m  AORTIC VALVE                   PULMONIC VALVE AV Vmax:           124.00 cm/s PV Vmax:       1.32 m/s AV Vmean:          78.600 cm/s PV Peak grad:  7.0 mmHg AV VTI:            0.267 m  AV Peak Grad:      6.2 mmHg AV Mean Grad:      3.0 mmHg LVOT Vmax:         94.60 cm/s LVOT Vmean:        54.400 cm/s LVOT VTI:          0.192 m LVOT/AV VTI ratio: 0.72  AORTA Ao Root diam: 2.90 cm Ao Asc diam:  3.80 cm MITRAL VALVE MV Area (PHT): 4.21 cm     SHUNTS MV Decel Time: 180 msec     Systemic VTI: 0.19 m MV E velocity: 84.40 cm/s MV A velocity: 122.00 cm/s MV E/A ratio:  0.69 Gwyndolyn Kaufman MD Electronically signed by Gwyndolyn Kaufman MD Signature Date/Time: 05/01/2021/5:27:28 PM    Final    VAS [place] UPPER EXTREMITY VENOUS DUPLEX  Result Date: 05/07/2021 UPPER VENOUS STUDY  Patient Name:  [name]  Date of Exam:   05/07/2021 Medical Rec #: [id number]       Accession #:    [id number] Date of Birth: Jul 23, 1934       Patient Gender: F Patient Age:   27 years Exam Location:  [facility] Procedure:      VAS [place] UPPER EXTREMITY VENOUS DUPLEX Referring Phys: Nita Sells --------------------------------------------------------------------------------  Indications: Swelling Limitations: Poor ultrasound/tissue interface and body habitus. Comparison Study: No previous exams Performing Technologist: Jody Hill RVT, RDMS  Examination Guidelines: A complete evaluation includes B-mode imaging, spectral Doppler, color Doppler, and power Doppler  as needed of all accessible portions of each vessel. Bilateral testing is considered an integral part of a complete examination. Limited examinations for reoccurring indications may be performed as noted.  Right Findings: +----------+------------+---------+-----------+----------+--------------+  RIGHT      Compressible Phasicity Spontaneous Properties    Summary      +----------+------------+---------+-----------+----------+--------------+  IJV            Full        Yes        Yes                                +----------+------------+---------+-----------+----------+--------------+  Subclavian     Full        Yes        Yes                                +----------+------------+---------+-----------+----------+--------------+  Axillary       Full        Yes        Yes                                +----------+------------+---------+-----------+----------+--------------+  Brachial       Full                                                      +----------+------------+---------+-----------+----------+--------------+  Radial         Full                                                      +----------+------------+---------+-----------+----------+--------------+  Ulnar          Full                                                      +----------+------------+---------+-----------+----------+--------------+  Cephalic                                                 Not visualized  +----------+------------+---------+-----------+----------+--------------+  Basilic        Full        Yes        Yes                                +----------+------------+---------+-----------+----------+--------------+ Only one of paired ulnar veins visualized  Summary:  Right: No evidence of deep vein thrombosis in the upper extremity. No evidence of superficial vein thrombosis in the upper extremity. However, unable to visualize the cephalic vein.  Left: No evidence of thrombosis in the subclavian.  *See table(s) above for  measurements and observations.  Diagnosing physician: Orlie Pollen Electronically signed by Orlie Pollen on 05/07/2021 at 6:56:09 PM.    Final    DG ESOPHAGUS W SINGLE CM (SOL OR THIN BA)  Result Date: 05/04/2021 CLINICAL DATA:  Esophageal stricture follow-up. EXAM: ESOPHAGUS/BARIUM SWALLOW/TABLET STUDY TECHNIQUE: Single contrast examination was performed using thin liquid barium. This exam was performed by Rushie Nyhan NP, and was supervised and interpreted by Fabiola Backer MD. FLUOROSCOPY TIME:  Radiation Exposure Index (as provided by the fluoroscopic device): 10.4 mGy If the device does not provide the exposure index: Fluoroscopy Time:  1 minute, 6 seconds Number of Acquired Images:  0 COMPARISON:  Esophagram dated June 25, 2020. FINDINGS: Swallowing: Appears normal. No vestibular penetration or aspiration seen. Pharynx: Unremarkable. Esophagus: Unchanged short segment smooth circumferential narrowing of the distal esophagus near the GE junction. Esophageal motility: Occasional mild tertiary contractions. Hiatal Hernia: None. Gastroesophageal reflux: None visualized. Ingested 25mm barium tablet: Not given Other: None. IMPRESSION: 1. Unchanged mild peptic stricture in the distal esophagus near the GE junction. 2. Mild esophageal dysmotility. Electronically Signed   By: Titus Dubin M.D.   On: 05/04/2021 16:32     Subjective: -appears comfortable  Discharge Exam: BP (!) 145/67    Pulse 66    Temp (!) 97.4 F (36.3 C)    Resp (!) 28    Ht 5\' 1"  (1.549 m)    Wt 68.2 kg    SpO2 100%    BMI 28.41 kg/m   General: Pt is NAD Cardiovascular: RRR, S1/S2 +, no rubs, no gallops   The results of significant diagnostics from this hospitalization (including imaging, microbiology, ancillary and laboratory) are listed below for reference.     Microbiology: No results found for this or any previous visit (from the past 240 hour(s)).   Labs: Basic Metabolic Panel: Recent Labs  Lab  05/11/21 0441 05/12/21 0452 05/13/21 0439 05/14/21 0438 05/15/21 0442  NA 130* 130* 133* 128* 128*  K 3.4* 3.2* 4.8 5.2* 5.3*  CL 95* 95* 100 98 99  CO2 28 29 26 22  21*  GLUCOSE 95 110* 103* 225* 201*  BUN 30* 30*  26* 28* 35*  CREATININE 1.13* 1.01* 0.76 0.78 0.77  CALCIUM 8.0* 8.2* 8.8* 8.8* 8.4*  MG 1.7  --  2.4 2.0 2.1  PHOS  --  3.7 2.8 2.5 2.6   Liver Function Tests: Recent Labs  Lab 05/10/21 0430 05/11/21 0441 05/12/21 0452 05/13/21 0439  AST 18 17  --   --   ALT 15 15  --   --   ALKPHOS 91 91  --   --   BILITOT 0.9 1.0  --   --   PROT 4.9* 5.0*  --   --   ALBUMIN 2.4* 2.4* 2.3* 2.5*   CBC: Recent Labs  Lab 05/11/21 0441 05/13/21 0439 05/14/21 0536 05/15/21 0442  WBC 5.8 6.4 3.2* 4.3  HGB 7.2* 7.9* 7.2* 7.4*  HCT 21.3* 23.4* 22.2* 23.4*  MCV 97.3 98.7 99.6 102.2*  PLT 316 351 347 348   CBG: Recent Labs  Lab 05/14/21 2359 05/15/21 0400 05/15/21 0719 05/15/21 1208 05/15/21 1647  GLUCAP 271* 215* 138* 178* 172*   Hgb A1c Recent Labs    05/14/21 0536  HGBA1C 5.4   Lipid Profile No results for input(s): CHOL, HDL, LDLCALC, TRIG, CHOLHDL, LDLDIRECT in the last 72 hours. Thyroid function studies No results for input(s): TSH, T4TOTAL, T3FREE, THYROIDAB in the last 72 hours.  Invalid input(s): FREET3 Urinalysis    Component Value Date/Time   COLORURINE YELLOW 05/07/2021 1115   APPEARANCEUR CLOUDY (A) 05/07/2021 1115   LABSPEC 1.018 05/07/2021 1115   PHURINE 5.0 05/07/2021 1115   GLUCOSEU NEGATIVE 05/07/2021 1115   HGBUR MODERATE (A) 05/07/2021 1115   BILIRUBINUR NEGATIVE 05/07/2021 1115   KETONESUR NEGATIVE 05/07/2021 1115   PROTEINUR NEGATIVE 05/07/2021 1115   UROBILINOGEN 0.2 06/28/2011 1358   NITRITE NEGATIVE 05/07/2021 1115   LEUKOCYTESUR NEGATIVE 05/07/2021 1115    FURTHER DISCHARGE INSTRUCTIONS:   Get Medicines reviewed and adjusted: Please take all your medications with you for your next visit with your Primary MD    Laboratory/radiological data: Please request your Primary MD to go over all hospital tests and procedure/radiological results at the follow up, please ask your Primary MD to get all Hospital records sent to his/her office.   In some cases, they will be blood work, cultures and biopsy results pending at the time of your discharge. Please request that your primary care M.D. goes through all the records of your hospital data and follows up on these results.   Also Note the following: If you experience worsening of your admission symptoms, develop shortness of breath, life threatening emergency, suicidal or homicidal thoughts you must seek medical attention immediately by calling 911 or calling your MD immediately  if symptoms less severe.   You must read complete instructions/literature along with all the possible adverse reactions/side effects for all the Medicines you take and that have been prescribed to you. Take any new Medicines after you have completely understood and accpet all the possible adverse reactions/side effects.    Do not drive when taking Pain medications or sleeping medications (Benzodaizepines)   Do not take more than prescribed Pain, Sleep and Anxiety Medications. It is not advisable to combine anxiety,sleep and pain medications without talking with your primary care practitioner   Special Instructions: If you have smoked or chewed Tobacco  in the last 2 yrs please stop smoking, stop any regular Alcohol  and or any Recreational drug use.   Wear Seat belts while driving.   Please note: You were cared for by a  hospitalist during your hospital stay. Once you are discharged, your primary care physician will handle any further medical issues. Please note that NO REFILLS for any discharge medications will be authorized once you are discharged, as it is imperative that you return to your primary care physician (or establish a relationship with a primary care physician if you do not  have one) for your post hospital discharge needs so that they can reassess your need for medications and monitor your lab values.  Time coordinating discharge: 25 minutes  SIGNED:  Marzetta Board, MD, PhD 05/16/2021, 12:43 PM

## 2021-05-16 NOTE — TOC Progression Note (Addendum)
Transition of Care [facility]) - Progression Note    Patient Details  Name: [name] MRN: [id number] Date of Birth: 1934-09-28  Transition of Care [facility]) CM/SW Contact  Ross Ludwig, [place] Phone Number: 05/16/2021, 9:48 AM  Clinical Narrative:     CSW spoke to patient's husband Rush Landmark to discuss hospice facility choice.  Per patient's husband they would like [facility] for hospice facility placement.  CSW contacted weekend on call, and she is reviewing patient's information and will let CSW know.  CSW to continue to follow patient's progress throughout discharge planning.  12:43pm CSW spoke to [facility] patient has been accepted. However their computer systems are down, and will let CSW know once their system is back up and consents have been signed by patient's family.  CSW updated attending physician and bedside nurse.   Expected Discharge Plan:  (TBD) Barriers to Discharge: Continued Medical Work up  Expected Discharge Plan and Services Expected Discharge Plan:  (TBD)                                               Social Determinants of Health (SDOH) Interventions    Readmission Risk Interventions No flowsheet data found.

## 2021-05-16 NOTE — Progress Notes (Signed)
Received report from the nurse who had patient up until 0300 on 05/16/2021. Agree with nurse's assessment and will continue to monitor and care for patient for the rest of the shift.
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
## 2021-05-22 ENCOUNTER — Encounter (HOSPITAL_COMMUNITY): Payer: Self-pay | Admitting: Gastroenterology

## 2021-05-22 ENCOUNTER — Telehealth: Payer: Self-pay | Admitting: Gastroenterology

## 2021-05-22 NOTE — Telephone Encounter (Signed)
Inbound call from [facility] pre cert department States patient have an upcoming procedure 2/813 but looks like she was discharged home form the hospital to hospice. They would like to know if patient procedure will need to be cancelled?

## 2021-05-25 NOTE — Telephone Encounter (Signed)
Confirmed with pt's daughter that procedure scheduled for 06/01/21 needs to be canceled, pt has transitioned to comfort care. Hospital scheduling has been made aware.

## 2021-06-01 ENCOUNTER — Ambulatory Visit (HOSPITAL_COMMUNITY): Admission: RE | Admit: 2021-06-01 | Payer: Medicare PPO | Source: Home / Self Care | Admitting: Gastroenterology

## 2021-06-01 ENCOUNTER — Encounter (HOSPITAL_COMMUNITY): Admission: RE | Payer: Self-pay | Source: Home / Self Care

## 2021-06-01 SURGERY — ESOPHAGOGASTRODUODENOSCOPY (EGD) WITH PROPOFOL
Anesthesia: Monitor Anesthesia Care

## 2021-06-09 DIAGNOSIS — R2689 Other abnormalities of gait and mobility: Secondary | ICD-10-CM | POA: Diagnosis not present

## 2021-06-09 DIAGNOSIS — I480 Paroxysmal atrial fibrillation: Secondary | ICD-10-CM | POA: Diagnosis not present

## 2021-06-09 DIAGNOSIS — R1310 Dysphagia, unspecified: Secondary | ICD-10-CM | POA: Diagnosis not present

## 2021-06-09 DIAGNOSIS — R410 Disorientation, unspecified: Secondary | ICD-10-CM | POA: Diagnosis not present

## 2021-06-09 DIAGNOSIS — I4901 Ventricular fibrillation: Secondary | ICD-10-CM | POA: Diagnosis not present

## 2021-06-09 DIAGNOSIS — R531 Weakness: Secondary | ICD-10-CM | POA: Diagnosis not present

## 2021-06-09 DIAGNOSIS — I469 Cardiac arrest, cause unspecified: Secondary | ICD-10-CM | POA: Diagnosis not present

## 2021-06-11 IMAGING — RF DG ESOPHAGUS
10 series · 14 of 24 positions shown · non-contrast
Comparison: 05/09/2020 esophagram.

CLINICAL DATA: History of paraesophageal hernia repair 04/28/2020.
History of esophageal dilation. Patient presents with dysphagia with
intermittent vomiting.

EXAM:
ESOPHOGRAM / BARIUM SWALLOW / BARIUM TABLET STUDY
TECHNIQUE: Combined double contrast and single contrast examination performed
using effervescent crystals, thick barium liquid, and thin barium
liquid. The patient was observed with fluoroscopy swallowing a 13 mm
barium sulphate tablet.
FLUOROSCOPY TIME:  Fluoroscopy Time:  3 minutes 42 second
Radiation Exposure Index (if provided by the fluoroscopic device):
317 mGy
Number of Acquired Spot Images: 9

[Series 1: sequence · 2 of 30 frames shown (1 of 7)]
[frame 5/30]
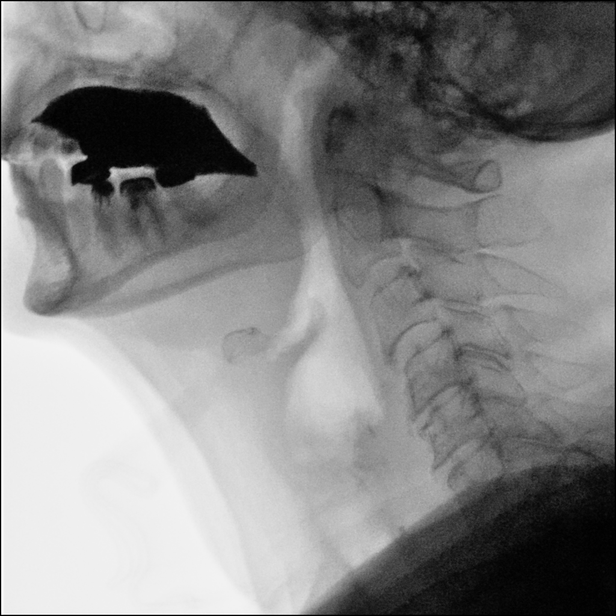
[frame 26/30]
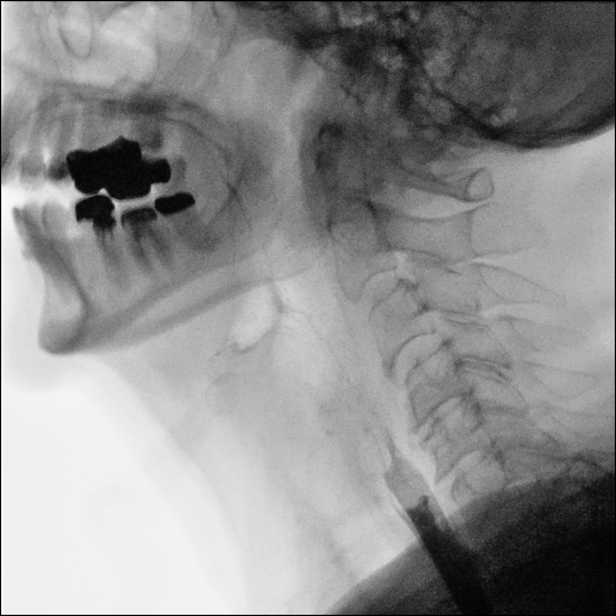

[Series 3: sequence · 1 of 22 frames shown (2 of 7)]
[frame 15/22]
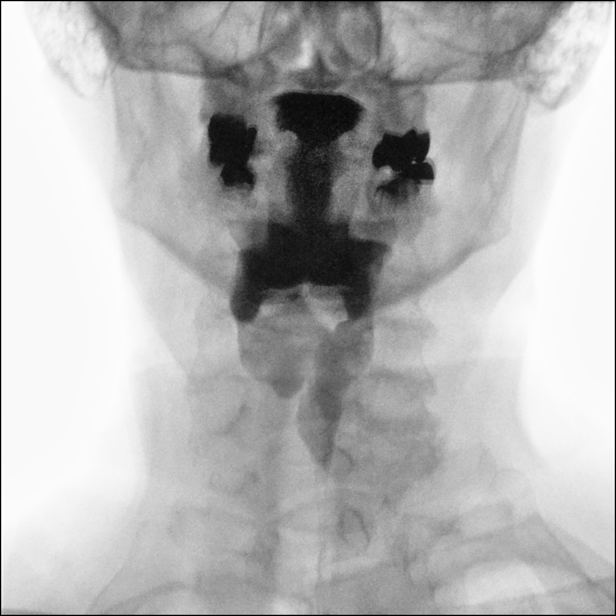

[Series 4: one shot · 0.15mm/px · 2 of 7 slices shown (1 of 3)]
[im 2/7]
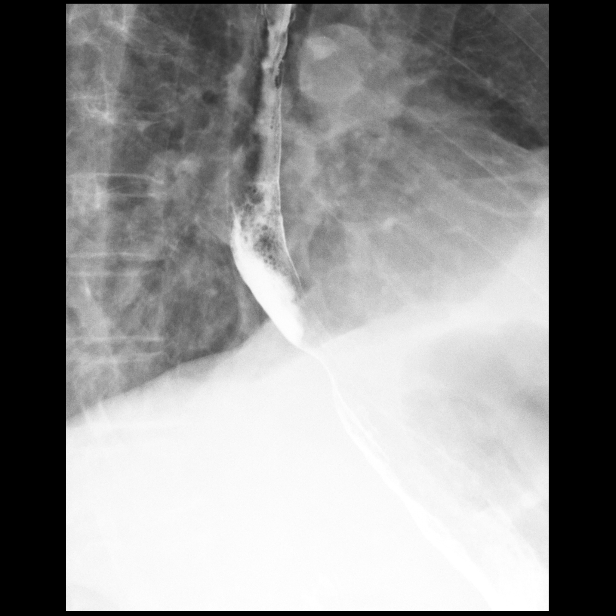
[im 4/7]
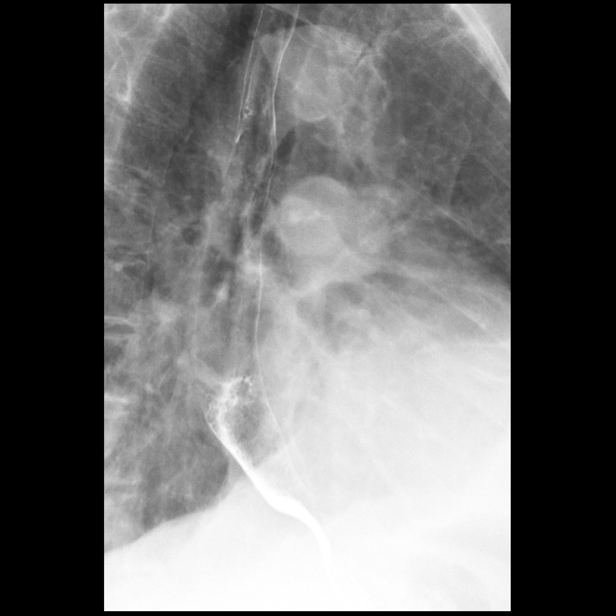

[Series 5: sequence · 1 of 44 frames shown (3 of 7)]
[frame 7/44]
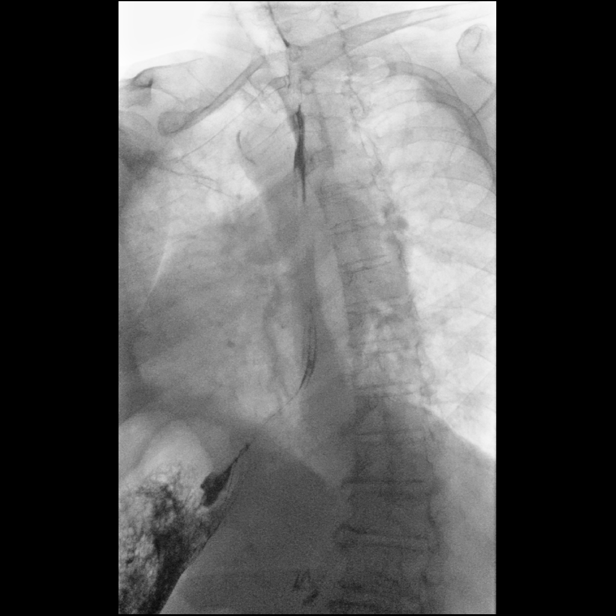

[Series 6: sequence · 2 of 109 frames shown (4 of 7)]
[frame 7/109]
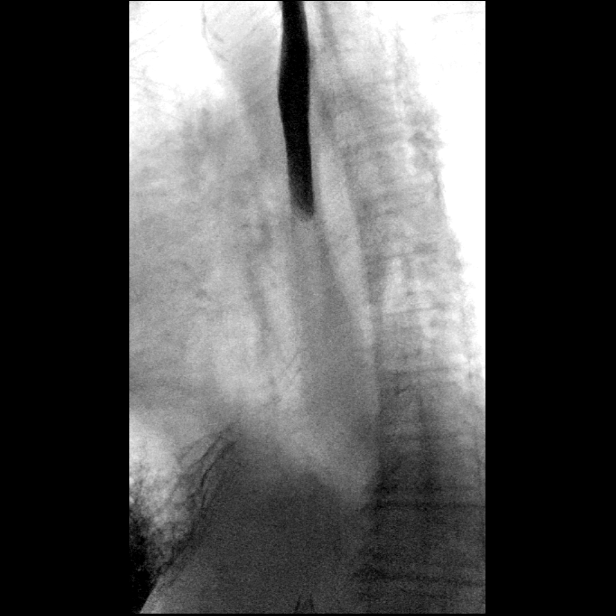
[frame 55/109]
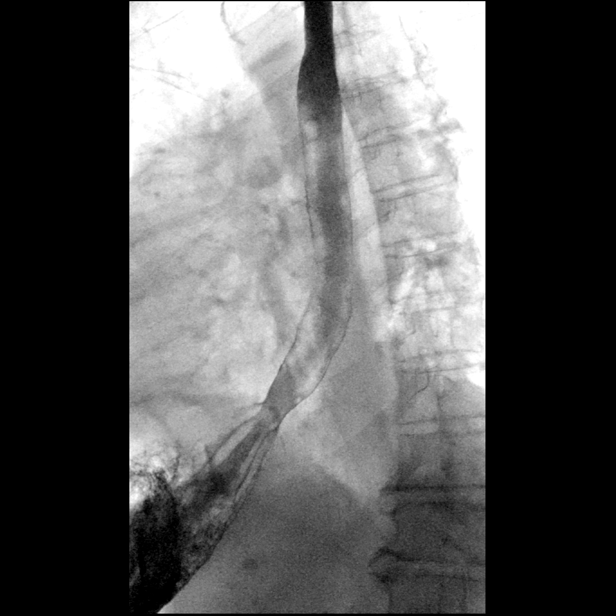

[Series 7: one shot · 1 of 2 slices shown (2 of 3)]
[im 2/2]
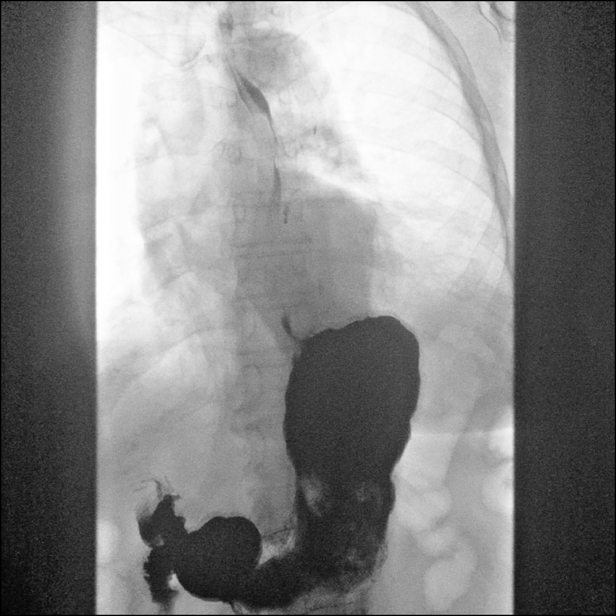

[Series 8: sequence · 1 of 38 frames shown (5 of 7)]
[frame 33/38]
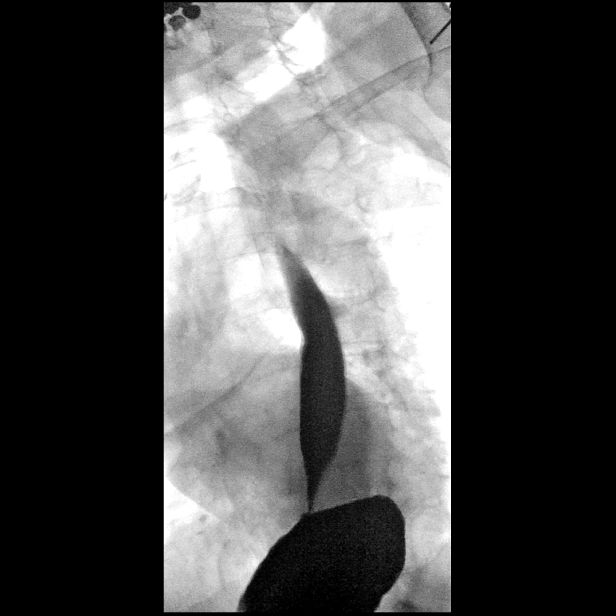

[Series 10: sequence · 2 of 99 frames shown (6 of 7)]
[frame 50/99]
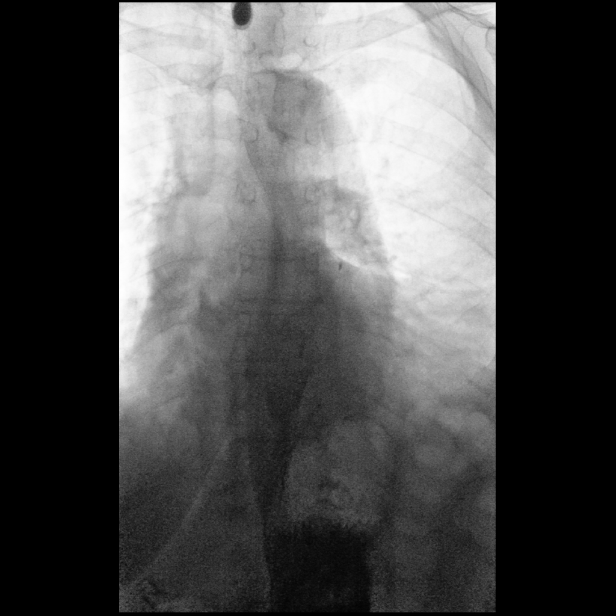
[frame 85/99]
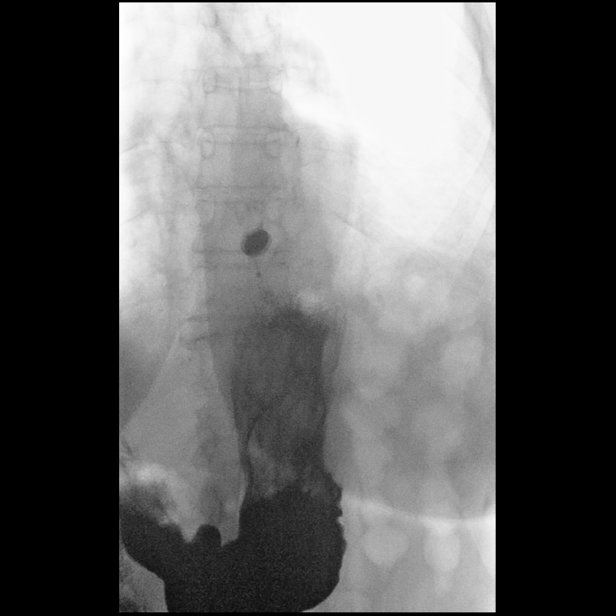

[Series 12: sequence · 1 of 56 frames shown (7 of 7)]
[frame 29/56]
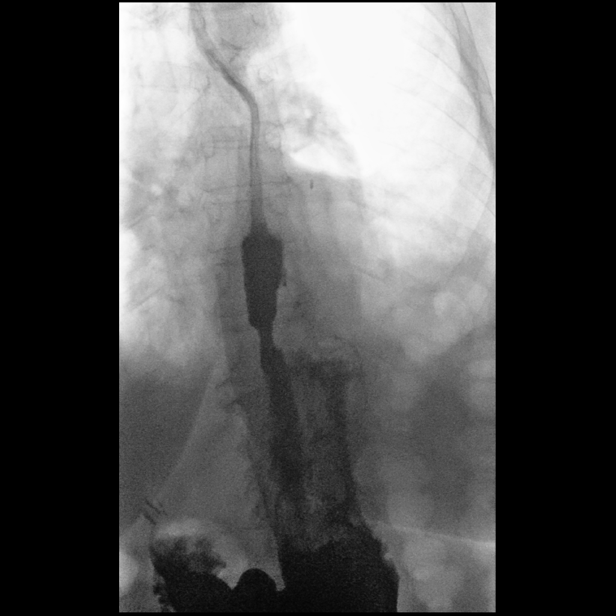

[Series 13: one shot · 1 of 2 slices shown (3 of 3)]
[im 2/2]
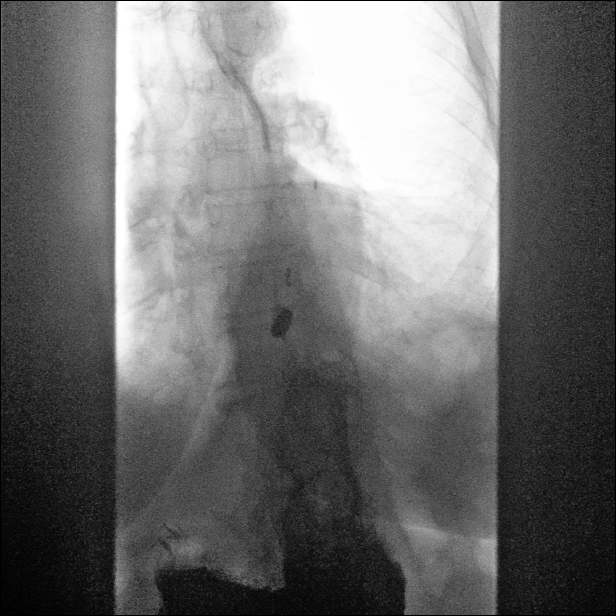

[14 of 24 positions shown; findings below may reference images not displayed]

FINDINGS: Normal oral and pharyngeal phases of swallowing, with no large or
penetration or tracheobronchial aspiration. No significant barium
retention in the pharynx. No evidence of pharyngeal mass, stricture
or diverticulum. No evidence of cricopharyngeus muscle dysfunction.

No evidence of recurrent hiatal hernia. Normal esophageal motility.
Moderate to marked gastroesophageal reflux elicited to the level of
the upper thoracic esophagus with water siphon test. Normal
esophageal mucosa. Short segment of mild smooth circumferential
narrowing in the lower thoracic esophagus extending to the
esophagogastric junction, at which location the barium tablet became
lodged despite multiple water and barium swallows, suggestive of a
mild peptic stricture. No evidence of esophageal mass or ulcer.
IMPRESSION: 1. No evidence of recurrent hiatal hernia.
2. Moderate to marked gastroesophageal reflux elicited.
3. Findings suggestive of a mild peptic stricture in the lower
thoracic esophagus, see comments. No evidence of esophageal mass.
Upper endoscopic correlation suggested.

## 2021-06-13 DIAGNOSIS — R0789 Other chest pain: Secondary | ICD-10-CM | POA: Diagnosis not present

## 2021-06-13 DIAGNOSIS — J9690 Respiratory failure, unspecified, unspecified whether with hypoxia or hypercapnia: Secondary | ICD-10-CM | POA: Diagnosis not present

## 2021-06-13 DIAGNOSIS — I2609 Other pulmonary embolism with acute cor pulmonale: Secondary | ICD-10-CM | POA: Diagnosis not present

## 2021-06-13 DIAGNOSIS — J918 Pleural effusion in other conditions classified elsewhere: Secondary | ICD-10-CM | POA: Diagnosis not present

## 2021-06-17 DIAGNOSIS — J9610 Chronic respiratory failure, unspecified whether with hypoxia or hypercapnia: Secondary | ICD-10-CM | POA: Diagnosis not present

## 2021-06-17 DIAGNOSIS — E46 Unspecified protein-calorie malnutrition: Secondary | ICD-10-CM | POA: Diagnosis not present

## 2021-06-17 DIAGNOSIS — Z431 Encounter for attention to gastrostomy: Secondary | ICD-10-CM | POA: Diagnosis not present

## 2021-06-17 DIAGNOSIS — R1314 Dysphagia, pharyngoesophageal phase: Secondary | ICD-10-CM | POA: Diagnosis not present

## 2021-06-17 DIAGNOSIS — I110 Hypertensive heart disease with heart failure: Secondary | ICD-10-CM | POA: Diagnosis not present

## 2021-06-17 DIAGNOSIS — Z48815 Encounter for surgical aftercare following surgery on the digestive system: Secondary | ICD-10-CM | POA: Diagnosis not present

## 2021-06-17 DIAGNOSIS — I5032 Chronic diastolic (congestive) heart failure: Secondary | ICD-10-CM | POA: Diagnosis not present

## 2021-06-17 DIAGNOSIS — M6281 Muscle weakness (generalized): Secondary | ICD-10-CM | POA: Diagnosis not present

## 2021-06-17 DIAGNOSIS — R2689 Other abnormalities of gait and mobility: Secondary | ICD-10-CM | POA: Diagnosis not present

## 2021-06-24 DIAGNOSIS — I509 Heart failure, unspecified: Secondary | ICD-10-CM | POA: Insufficient documentation

## 2021-06-24 DIAGNOSIS — J449 Chronic obstructive pulmonary disease, unspecified: Secondary | ICD-10-CM

## 2021-06-24 DIAGNOSIS — J4489 Other specified chronic obstructive pulmonary disease: Secondary | ICD-10-CM

## 2021-06-24 HISTORY — DX: Heart failure, unspecified: I50.9

## 2021-06-24 HISTORY — DX: Chronic obstructive pulmonary disease, unspecified: J44.9

## 2021-06-24 HISTORY — DX: Other specified chronic obstructive pulmonary disease: J44.89

## 2021-06-25 ENCOUNTER — Ambulatory Visit: Payer: Medicare PPO | Admitting: Cardiology

## 2021-07-01 ENCOUNTER — Other Ambulatory Visit (HOSPITAL_COMMUNITY): Payer: Self-pay | Admitting: Family Medicine

## 2021-07-01 DIAGNOSIS — K9423 Gastrostomy malfunction: Secondary | ICD-10-CM

## 2021-07-04 DIAGNOSIS — M6389 Disorders of muscle in diseases classified elsewhere, multiple sites: Secondary | ICD-10-CM | POA: Diagnosis not present

## 2021-07-04 DIAGNOSIS — I5032 Chronic diastolic (congestive) heart failure: Secondary | ICD-10-CM | POA: Diagnosis not present

## 2021-07-04 DIAGNOSIS — Z48815 Encounter for surgical aftercare following surgery on the digestive system: Secondary | ICD-10-CM | POA: Diagnosis not present

## 2021-07-04 DIAGNOSIS — M6281 Muscle weakness (generalized): Secondary | ICD-10-CM | POA: Diagnosis not present

## 2021-07-04 DIAGNOSIS — I110 Hypertensive heart disease with heart failure: Secondary | ICD-10-CM | POA: Diagnosis not present

## 2021-07-04 DIAGNOSIS — R2689 Other abnormalities of gait and mobility: Secondary | ICD-10-CM | POA: Diagnosis not present

## 2021-07-04 DIAGNOSIS — R2681 Unsteadiness on feet: Secondary | ICD-10-CM | POA: Diagnosis not present

## 2021-07-04 DIAGNOSIS — K219 Gastro-esophageal reflux disease without esophagitis: Secondary | ICD-10-CM | POA: Diagnosis not present

## 2021-07-04 DIAGNOSIS — R1314 Dysphagia, pharyngoesophageal phase: Secondary | ICD-10-CM | POA: Diagnosis not present

## 2021-07-06 ENCOUNTER — Encounter (HOSPITAL_COMMUNITY): Payer: Self-pay

## 2021-07-06 ENCOUNTER — Other Ambulatory Visit (HOSPITAL_COMMUNITY): Payer: Self-pay | Admitting: Family Medicine

## 2021-07-06 ENCOUNTER — Ambulatory Visit (HOSPITAL_COMMUNITY)
Admission: RE | Admit: 2021-07-06 | Discharge: 2021-07-06 | Disposition: A | Discharge status: Home / Self Care | Payer: Medicare PPO | Source: Ambulatory Visit | Attending: Family Medicine | Admitting: Family Medicine

## 2021-07-06 ENCOUNTER — Other Ambulatory Visit: Payer: Self-pay

## 2021-07-06 DIAGNOSIS — K9423 Gastrostomy malfunction: Secondary | ICD-10-CM

## 2021-07-06 DIAGNOSIS — R2689 Other abnormalities of gait and mobility: Secondary | ICD-10-CM | POA: Diagnosis not present

## 2021-07-06 DIAGNOSIS — K219 Gastro-esophageal reflux disease without esophagitis: Secondary | ICD-10-CM | POA: Diagnosis not present

## 2021-07-06 DIAGNOSIS — I5032 Chronic diastolic (congestive) heart failure: Secondary | ICD-10-CM | POA: Diagnosis not present

## 2021-07-06 DIAGNOSIS — M6281 Muscle weakness (generalized): Secondary | ICD-10-CM | POA: Diagnosis not present

## 2021-07-06 DIAGNOSIS — R1314 Dysphagia, pharyngoesophageal phase: Secondary | ICD-10-CM | POA: Diagnosis not present

## 2021-07-06 DIAGNOSIS — Z431 Encounter for attention to gastrostomy: Secondary | ICD-10-CM | POA: Diagnosis not present

## 2021-07-06 DIAGNOSIS — I110 Hypertensive heart disease with heart failure: Secondary | ICD-10-CM | POA: Diagnosis not present

## 2021-07-06 DIAGNOSIS — M6389 Disorders of muscle in diseases classified elsewhere, multiple sites: Secondary | ICD-10-CM | POA: Diagnosis not present

## 2021-07-06 DIAGNOSIS — Z48815 Encounter for surgical aftercare following surgery on the digestive system: Secondary | ICD-10-CM | POA: Diagnosis not present

## 2021-07-06 DIAGNOSIS — R2681 Unsteadiness on feet: Secondary | ICD-10-CM | POA: Diagnosis not present

## 2021-07-06 HISTORY — PX: IR PATIENT EVAL TECH 0-60 MINS: IMG5564

## 2021-07-06 MED ORDER — LIDOCAINE HCL URETHRAL/MUCOSAL 2 % EX GEL: 1.0000 "application " | Freq: Once | CUTANEOUS | Status: DC

## 2021-07-06 MED ORDER — LIDOCAINE VISCOUS HCL 2 % MT SOLN
OROMUCOSAL | Status: AC
  Filled 2021-07-06: qty 15

## 2021-07-06 NOTE — Procedures (Signed)
D/C feeding tube, pt & family wish to have rmoved, havent used > mt ? ?

## 2021-07-06 NOTE — Procedures (Signed)
Pre procedural Dx: esophageal stricture, dysphagia. ?Post procedural Dx: Same ? ?Successful removal of 20 Fr pull-through gastrostomy tube.   ?Site bandaged. ? ?EBL: Trace ?Complications: None immediate. ? ?[name] Berthold, PA-C ?  ?

## 2021-07-07 DIAGNOSIS — Z8659 Personal history of other mental and behavioral disorders: Secondary | ICD-10-CM | POA: Diagnosis not present

## 2021-07-07 DIAGNOSIS — R1312 Dysphagia, oropharyngeal phase: Secondary | ICD-10-CM | POA: Diagnosis not present

## 2021-07-07 DIAGNOSIS — R1310 Dysphagia, unspecified: Secondary | ICD-10-CM | POA: Diagnosis not present

## 2021-07-07 DIAGNOSIS — R262 Difficulty in walking, not elsewhere classified: Secondary | ICD-10-CM | POA: Diagnosis not present

## 2021-07-07 DIAGNOSIS — R531 Weakness: Secondary | ICD-10-CM | POA: Diagnosis not present

## 2021-07-07 DIAGNOSIS — R41841 Cognitive communication deficit: Secondary | ICD-10-CM | POA: Diagnosis not present

## 2021-07-07 DIAGNOSIS — M6281 Muscle weakness (generalized): Secondary | ICD-10-CM | POA: Diagnosis not present

## 2021-07-07 DIAGNOSIS — R278 Other lack of coordination: Secondary | ICD-10-CM | POA: Diagnosis not present

## 2021-07-07 DIAGNOSIS — Z8674 Personal history of sudden cardiac arrest: Secondary | ICD-10-CM | POA: Diagnosis not present

## 2021-07-08 DIAGNOSIS — I5042 Chronic combined systolic (congestive) and diastolic (congestive) heart failure: Secondary | ICD-10-CM | POA: Diagnosis not present

## 2021-07-08 DIAGNOSIS — M6281 Muscle weakness (generalized): Secondary | ICD-10-CM | POA: Diagnosis not present

## 2021-07-08 DIAGNOSIS — R29898 Other symptoms and signs involving the musculoskeletal system: Secondary | ICD-10-CM | POA: Diagnosis not present

## 2021-07-08 DIAGNOSIS — R1312 Dysphagia, oropharyngeal phase: Secondary | ICD-10-CM | POA: Diagnosis not present

## 2021-07-08 DIAGNOSIS — R278 Other lack of coordination: Secondary | ICD-10-CM | POA: Diagnosis not present

## 2021-07-08 DIAGNOSIS — D509 Iron deficiency anemia, unspecified: Secondary | ICD-10-CM | POA: Diagnosis not present

## 2021-07-08 DIAGNOSIS — K219 Gastro-esophageal reflux disease without esophagitis: Secondary | ICD-10-CM | POA: Diagnosis not present

## 2021-07-08 DIAGNOSIS — M159 Polyosteoarthritis, unspecified: Secondary | ICD-10-CM | POA: Diagnosis not present

## 2021-07-08 DIAGNOSIS — R41841 Cognitive communication deficit: Secondary | ICD-10-CM | POA: Diagnosis not present

## 2021-07-08 DIAGNOSIS — J431 Panlobular emphysema: Secondary | ICD-10-CM | POA: Diagnosis not present

## 2021-07-08 DIAGNOSIS — R1314 Dysphagia, pharyngoesophageal phase: Secondary | ICD-10-CM | POA: Diagnosis not present

## 2021-07-08 DIAGNOSIS — R262 Difficulty in walking, not elsewhere classified: Secondary | ICD-10-CM | POA: Diagnosis not present

## 2021-07-09 DIAGNOSIS — R262 Difficulty in walking, not elsewhere classified: Secondary | ICD-10-CM | POA: Diagnosis not present

## 2021-07-09 DIAGNOSIS — M6281 Muscle weakness (generalized): Secondary | ICD-10-CM | POA: Diagnosis not present

## 2021-07-09 DIAGNOSIS — R278 Other lack of coordination: Secondary | ICD-10-CM | POA: Diagnosis not present

## 2021-07-09 DIAGNOSIS — Z0189 Encounter for other specified special examinations: Secondary | ICD-10-CM | POA: Diagnosis not present

## 2021-07-09 DIAGNOSIS — R41841 Cognitive communication deficit: Secondary | ICD-10-CM | POA: Diagnosis not present

## 2021-07-09 DIAGNOSIS — R1312 Dysphagia, oropharyngeal phase: Secondary | ICD-10-CM | POA: Diagnosis not present

## 2021-07-10 DIAGNOSIS — R41841 Cognitive communication deficit: Secondary | ICD-10-CM | POA: Diagnosis not present

## 2021-07-10 DIAGNOSIS — M6281 Muscle weakness (generalized): Secondary | ICD-10-CM | POA: Diagnosis not present

## 2021-07-10 DIAGNOSIS — R262 Difficulty in walking, not elsewhere classified: Secondary | ICD-10-CM | POA: Diagnosis not present

## 2021-07-10 DIAGNOSIS — R1312 Dysphagia, oropharyngeal phase: Secondary | ICD-10-CM | POA: Diagnosis not present

## 2021-07-10 DIAGNOSIS — R278 Other lack of coordination: Secondary | ICD-10-CM | POA: Diagnosis not present

## 2021-07-13 DIAGNOSIS — R262 Difficulty in walking, not elsewhere classified: Secondary | ICD-10-CM | POA: Diagnosis not present

## 2021-07-13 DIAGNOSIS — M6281 Muscle weakness (generalized): Secondary | ICD-10-CM | POA: Diagnosis not present

## 2021-07-13 DIAGNOSIS — R278 Other lack of coordination: Secondary | ICD-10-CM | POA: Diagnosis not present

## 2021-07-13 DIAGNOSIS — R1312 Dysphagia, oropharyngeal phase: Secondary | ICD-10-CM | POA: Diagnosis not present

## 2021-07-13 DIAGNOSIS — R41841 Cognitive communication deficit: Secondary | ICD-10-CM | POA: Diagnosis not present

## 2021-07-14 DIAGNOSIS — M6281 Muscle weakness (generalized): Secondary | ICD-10-CM | POA: Diagnosis not present

## 2021-07-14 DIAGNOSIS — R278 Other lack of coordination: Secondary | ICD-10-CM | POA: Diagnosis not present

## 2021-07-14 DIAGNOSIS — R262 Difficulty in walking, not elsewhere classified: Secondary | ICD-10-CM | POA: Diagnosis not present

## 2021-07-14 DIAGNOSIS — R41841 Cognitive communication deficit: Secondary | ICD-10-CM | POA: Diagnosis not present

## 2021-07-14 DIAGNOSIS — R1312 Dysphagia, oropharyngeal phase: Secondary | ICD-10-CM | POA: Diagnosis not present

## 2021-07-15 DIAGNOSIS — M6281 Muscle weakness (generalized): Secondary | ICD-10-CM | POA: Diagnosis not present

## 2021-07-15 DIAGNOSIS — R278 Other lack of coordination: Secondary | ICD-10-CM | POA: Diagnosis not present

## 2021-07-15 DIAGNOSIS — R1312 Dysphagia, oropharyngeal phase: Secondary | ICD-10-CM | POA: Diagnosis not present

## 2021-07-15 DIAGNOSIS — R262 Difficulty in walking, not elsewhere classified: Secondary | ICD-10-CM | POA: Diagnosis not present

## 2021-07-15 DIAGNOSIS — R41841 Cognitive communication deficit: Secondary | ICD-10-CM | POA: Diagnosis not present

## 2021-07-16 DIAGNOSIS — R278 Other lack of coordination: Secondary | ICD-10-CM | POA: Diagnosis not present

## 2021-07-16 DIAGNOSIS — R262 Difficulty in walking, not elsewhere classified: Secondary | ICD-10-CM | POA: Diagnosis not present

## 2021-07-16 DIAGNOSIS — R1312 Dysphagia, oropharyngeal phase: Secondary | ICD-10-CM | POA: Diagnosis not present

## 2021-07-16 DIAGNOSIS — R41841 Cognitive communication deficit: Secondary | ICD-10-CM | POA: Diagnosis not present

## 2021-07-16 DIAGNOSIS — M6281 Muscle weakness (generalized): Secondary | ICD-10-CM | POA: Diagnosis not present

## 2021-07-17 DIAGNOSIS — R262 Difficulty in walking, not elsewhere classified: Secondary | ICD-10-CM | POA: Diagnosis not present

## 2021-07-17 DIAGNOSIS — R1312 Dysphagia, oropharyngeal phase: Secondary | ICD-10-CM | POA: Diagnosis not present

## 2021-07-17 DIAGNOSIS — M6281 Muscle weakness (generalized): Secondary | ICD-10-CM | POA: Diagnosis not present

## 2021-07-17 DIAGNOSIS — R41841 Cognitive communication deficit: Secondary | ICD-10-CM | POA: Diagnosis not present

## 2021-07-17 DIAGNOSIS — R278 Other lack of coordination: Secondary | ICD-10-CM | POA: Diagnosis not present

## 2021-07-20 DIAGNOSIS — R278 Other lack of coordination: Secondary | ICD-10-CM | POA: Diagnosis not present

## 2021-07-20 DIAGNOSIS — R262 Difficulty in walking, not elsewhere classified: Secondary | ICD-10-CM | POA: Diagnosis not present

## 2021-07-20 DIAGNOSIS — M6281 Muscle weakness (generalized): Secondary | ICD-10-CM | POA: Diagnosis not present

## 2021-07-21 DIAGNOSIS — M6281 Muscle weakness (generalized): Secondary | ICD-10-CM | POA: Diagnosis not present

## 2021-07-21 DIAGNOSIS — R262 Difficulty in walking, not elsewhere classified: Secondary | ICD-10-CM | POA: Diagnosis not present

## 2021-07-21 DIAGNOSIS — R278 Other lack of coordination: Secondary | ICD-10-CM | POA: Diagnosis not present

## 2021-07-22 DIAGNOSIS — M6281 Muscle weakness (generalized): Secondary | ICD-10-CM | POA: Diagnosis not present

## 2021-07-22 DIAGNOSIS — R278 Other lack of coordination: Secondary | ICD-10-CM | POA: Diagnosis not present

## 2021-07-22 DIAGNOSIS — R262 Difficulty in walking, not elsewhere classified: Secondary | ICD-10-CM | POA: Diagnosis not present

## 2021-07-23 DIAGNOSIS — R278 Other lack of coordination: Secondary | ICD-10-CM | POA: Diagnosis not present

## 2021-07-23 DIAGNOSIS — R262 Difficulty in walking, not elsewhere classified: Secondary | ICD-10-CM | POA: Diagnosis not present

## 2021-07-23 DIAGNOSIS — M6281 Muscle weakness (generalized): Secondary | ICD-10-CM | POA: Diagnosis not present

## 2021-07-24 DIAGNOSIS — R278 Other lack of coordination: Secondary | ICD-10-CM | POA: Diagnosis not present

## 2021-07-24 DIAGNOSIS — M6281 Muscle weakness (generalized): Secondary | ICD-10-CM | POA: Diagnosis not present

## 2021-07-24 DIAGNOSIS — R262 Difficulty in walking, not elsewhere classified: Secondary | ICD-10-CM | POA: Diagnosis not present

## 2021-07-24 DIAGNOSIS — I5040 Unspecified combined systolic (congestive) and diastolic (congestive) heart failure: Secondary | ICD-10-CM | POA: Diagnosis not present

## 2021-07-27 DIAGNOSIS — R262 Difficulty in walking, not elsewhere classified: Secondary | ICD-10-CM | POA: Diagnosis not present

## 2021-07-27 DIAGNOSIS — M6281 Muscle weakness (generalized): Secondary | ICD-10-CM | POA: Diagnosis not present

## 2021-07-27 DIAGNOSIS — R278 Other lack of coordination: Secondary | ICD-10-CM | POA: Diagnosis not present

## 2021-07-28 DIAGNOSIS — E569 Vitamin deficiency, unspecified: Secondary | ICD-10-CM | POA: Diagnosis not present

## 2021-07-28 DIAGNOSIS — M6281 Muscle weakness (generalized): Secondary | ICD-10-CM | POA: Diagnosis not present

## 2021-07-28 DIAGNOSIS — R278 Other lack of coordination: Secondary | ICD-10-CM | POA: Diagnosis not present

## 2021-07-28 DIAGNOSIS — R262 Difficulty in walking, not elsewhere classified: Secondary | ICD-10-CM | POA: Diagnosis not present

## 2021-07-28 DIAGNOSIS — E039 Hypothyroidism, unspecified: Secondary | ICD-10-CM | POA: Diagnosis not present

## 2021-07-29 DIAGNOSIS — M6281 Muscle weakness (generalized): Secondary | ICD-10-CM | POA: Diagnosis not present

## 2021-07-29 DIAGNOSIS — R262 Difficulty in walking, not elsewhere classified: Secondary | ICD-10-CM | POA: Diagnosis not present

## 2021-07-29 DIAGNOSIS — R278 Other lack of coordination: Secondary | ICD-10-CM | POA: Diagnosis not present

## 2021-07-30 DIAGNOSIS — R278 Other lack of coordination: Secondary | ICD-10-CM | POA: Diagnosis not present

## 2021-07-30 DIAGNOSIS — M6281 Muscle weakness (generalized): Secondary | ICD-10-CM | POA: Diagnosis not present

## 2021-07-30 DIAGNOSIS — R262 Difficulty in walking, not elsewhere classified: Secondary | ICD-10-CM | POA: Diagnosis not present

## 2021-07-31 DIAGNOSIS — R262 Difficulty in walking, not elsewhere classified: Secondary | ICD-10-CM | POA: Diagnosis not present

## 2021-07-31 DIAGNOSIS — M6281 Muscle weakness (generalized): Secondary | ICD-10-CM | POA: Diagnosis not present

## 2021-07-31 DIAGNOSIS — R278 Other lack of coordination: Secondary | ICD-10-CM | POA: Diagnosis not present

## 2021-08-01 DIAGNOSIS — M6281 Muscle weakness (generalized): Secondary | ICD-10-CM | POA: Diagnosis not present

## 2021-08-01 DIAGNOSIS — R278 Other lack of coordination: Secondary | ICD-10-CM | POA: Diagnosis not present

## 2021-08-01 DIAGNOSIS — R262 Difficulty in walking, not elsewhere classified: Secondary | ICD-10-CM | POA: Diagnosis not present

## 2021-08-03 DIAGNOSIS — R278 Other lack of coordination: Secondary | ICD-10-CM | POA: Diagnosis not present

## 2021-08-03 DIAGNOSIS — M6281 Muscle weakness (generalized): Secondary | ICD-10-CM | POA: Diagnosis not present

## 2021-08-03 DIAGNOSIS — R262 Difficulty in walking, not elsewhere classified: Secondary | ICD-10-CM | POA: Diagnosis not present

## 2021-08-04 DIAGNOSIS — R262 Difficulty in walking, not elsewhere classified: Secondary | ICD-10-CM | POA: Diagnosis not present

## 2021-08-04 DIAGNOSIS — R278 Other lack of coordination: Secondary | ICD-10-CM | POA: Diagnosis not present

## 2021-08-04 DIAGNOSIS — M6281 Muscle weakness (generalized): Secondary | ICD-10-CM | POA: Diagnosis not present

## 2021-08-05 DIAGNOSIS — R0989 Other specified symptoms and signs involving the circulatory and respiratory systems: Secondary | ICD-10-CM | POA: Diagnosis not present

## 2021-08-05 DIAGNOSIS — R222 Localized swelling, mass and lump, trunk: Secondary | ICD-10-CM | POA: Diagnosis not present

## 2021-08-05 DIAGNOSIS — M6281 Muscle weakness (generalized): Secondary | ICD-10-CM | POA: Diagnosis not present

## 2021-08-05 DIAGNOSIS — I5040 Unspecified combined systolic (congestive) and diastolic (congestive) heart failure: Secondary | ICD-10-CM | POA: Diagnosis not present

## 2021-08-05 DIAGNOSIS — R278 Other lack of coordination: Secondary | ICD-10-CM | POA: Diagnosis not present

## 2021-08-05 DIAGNOSIS — R262 Difficulty in walking, not elsewhere classified: Secondary | ICD-10-CM | POA: Diagnosis not present

## 2021-08-05 DIAGNOSIS — J9811 Atelectasis: Secondary | ICD-10-CM | POA: Diagnosis not present

## 2021-08-05 DIAGNOSIS — D509 Iron deficiency anemia, unspecified: Secondary | ICD-10-CM | POA: Diagnosis not present

## 2021-08-05 DIAGNOSIS — J431 Panlobular emphysema: Secondary | ICD-10-CM | POA: Diagnosis not present

## 2021-08-06 DIAGNOSIS — R278 Other lack of coordination: Secondary | ICD-10-CM | POA: Diagnosis not present

## 2021-08-06 DIAGNOSIS — R262 Difficulty in walking, not elsewhere classified: Secondary | ICD-10-CM | POA: Diagnosis not present

## 2021-08-06 DIAGNOSIS — M6281 Muscle weakness (generalized): Secondary | ICD-10-CM | POA: Diagnosis not present

## 2021-08-07 DIAGNOSIS — M6281 Muscle weakness (generalized): Secondary | ICD-10-CM | POA: Diagnosis not present

## 2021-08-07 DIAGNOSIS — R262 Difficulty in walking, not elsewhere classified: Secondary | ICD-10-CM | POA: Diagnosis not present

## 2021-08-07 DIAGNOSIS — R278 Other lack of coordination: Secondary | ICD-10-CM | POA: Diagnosis not present

## 2021-08-08 DIAGNOSIS — R262 Difficulty in walking, not elsewhere classified: Secondary | ICD-10-CM | POA: Diagnosis not present

## 2021-08-08 DIAGNOSIS — M6281 Muscle weakness (generalized): Secondary | ICD-10-CM | POA: Diagnosis not present

## 2021-08-08 DIAGNOSIS — R278 Other lack of coordination: Secondary | ICD-10-CM | POA: Diagnosis not present

## 2021-08-08 DIAGNOSIS — I5040 Unspecified combined systolic (congestive) and diastolic (congestive) heart failure: Secondary | ICD-10-CM | POA: Diagnosis not present

## 2021-08-10 DIAGNOSIS — K219 Gastro-esophageal reflux disease without esophagitis: Secondary | ICD-10-CM | POA: Diagnosis not present

## 2021-08-10 DIAGNOSIS — R262 Difficulty in walking, not elsewhere classified: Secondary | ICD-10-CM | POA: Diagnosis not present

## 2021-08-10 DIAGNOSIS — D509 Iron deficiency anemia, unspecified: Secondary | ICD-10-CM | POA: Diagnosis not present

## 2021-08-10 DIAGNOSIS — R278 Other lack of coordination: Secondary | ICD-10-CM | POA: Diagnosis not present

## 2021-08-10 DIAGNOSIS — M6281 Muscle weakness (generalized): Secondary | ICD-10-CM | POA: Diagnosis not present

## 2021-08-10 DIAGNOSIS — I5040 Unspecified combined systolic (congestive) and diastolic (congestive) heart failure: Secondary | ICD-10-CM | POA: Diagnosis not present

## 2021-08-11 DIAGNOSIS — R262 Difficulty in walking, not elsewhere classified: Secondary | ICD-10-CM | POA: Diagnosis not present

## 2021-08-11 DIAGNOSIS — M6281 Muscle weakness (generalized): Secondary | ICD-10-CM | POA: Diagnosis not present

## 2021-08-11 DIAGNOSIS — R278 Other lack of coordination: Secondary | ICD-10-CM | POA: Diagnosis not present

## 2021-08-12 DIAGNOSIS — M6281 Muscle weakness (generalized): Secondary | ICD-10-CM | POA: Diagnosis not present

## 2021-08-12 DIAGNOSIS — R262 Difficulty in walking, not elsewhere classified: Secondary | ICD-10-CM | POA: Diagnosis not present

## 2021-08-12 DIAGNOSIS — R278 Other lack of coordination: Secondary | ICD-10-CM | POA: Diagnosis not present

## 2021-08-13 DIAGNOSIS — R278 Other lack of coordination: Secondary | ICD-10-CM | POA: Diagnosis not present

## 2021-08-13 DIAGNOSIS — R262 Difficulty in walking, not elsewhere classified: Secondary | ICD-10-CM | POA: Diagnosis not present

## 2021-08-13 DIAGNOSIS — M6281 Muscle weakness (generalized): Secondary | ICD-10-CM | POA: Diagnosis not present

## 2021-08-14 DIAGNOSIS — R262 Difficulty in walking, not elsewhere classified: Secondary | ICD-10-CM | POA: Diagnosis not present

## 2021-08-14 DIAGNOSIS — M6281 Muscle weakness (generalized): Secondary | ICD-10-CM | POA: Diagnosis not present

## 2021-08-14 DIAGNOSIS — R278 Other lack of coordination: Secondary | ICD-10-CM | POA: Diagnosis not present

## 2021-08-17 DIAGNOSIS — M6281 Muscle weakness (generalized): Secondary | ICD-10-CM | POA: Diagnosis not present

## 2021-08-17 DIAGNOSIS — R262 Difficulty in walking, not elsewhere classified: Secondary | ICD-10-CM | POA: Diagnosis not present

## 2021-08-17 DIAGNOSIS — R278 Other lack of coordination: Secondary | ICD-10-CM | POA: Diagnosis not present

## 2021-08-18 DIAGNOSIS — R262 Difficulty in walking, not elsewhere classified: Secondary | ICD-10-CM | POA: Diagnosis not present

## 2021-08-18 DIAGNOSIS — R278 Other lack of coordination: Secondary | ICD-10-CM | POA: Diagnosis not present

## 2021-08-18 DIAGNOSIS — M6281 Muscle weakness (generalized): Secondary | ICD-10-CM | POA: Diagnosis not present

## 2021-08-19 DIAGNOSIS — R278 Other lack of coordination: Secondary | ICD-10-CM | POA: Diagnosis not present

## 2021-08-19 DIAGNOSIS — M6281 Muscle weakness (generalized): Secondary | ICD-10-CM | POA: Diagnosis not present

## 2021-08-19 DIAGNOSIS — R262 Difficulty in walking, not elsewhere classified: Secondary | ICD-10-CM | POA: Diagnosis not present

## 2021-08-20 DIAGNOSIS — R278 Other lack of coordination: Secondary | ICD-10-CM | POA: Diagnosis not present

## 2021-08-20 DIAGNOSIS — R262 Difficulty in walking, not elsewhere classified: Secondary | ICD-10-CM | POA: Diagnosis not present

## 2021-08-20 DIAGNOSIS — M6281 Muscle weakness (generalized): Secondary | ICD-10-CM | POA: Diagnosis not present

## 2021-08-21 DIAGNOSIS — R262 Difficulty in walking, not elsewhere classified: Secondary | ICD-10-CM | POA: Diagnosis not present

## 2021-08-21 DIAGNOSIS — R278 Other lack of coordination: Secondary | ICD-10-CM | POA: Diagnosis not present

## 2021-08-21 DIAGNOSIS — M6281 Muscle weakness (generalized): Secondary | ICD-10-CM | POA: Diagnosis not present

## 2021-08-24 DIAGNOSIS — M6281 Muscle weakness (generalized): Secondary | ICD-10-CM | POA: Diagnosis not present

## 2021-08-24 DIAGNOSIS — R262 Difficulty in walking, not elsewhere classified: Secondary | ICD-10-CM | POA: Diagnosis not present

## 2021-08-24 DIAGNOSIS — R278 Other lack of coordination: Secondary | ICD-10-CM | POA: Diagnosis not present

## 2021-08-25 DIAGNOSIS — R278 Other lack of coordination: Secondary | ICD-10-CM | POA: Diagnosis not present

## 2021-08-25 DIAGNOSIS — M6281 Muscle weakness (generalized): Secondary | ICD-10-CM | POA: Diagnosis not present

## 2021-08-25 DIAGNOSIS — R262 Difficulty in walking, not elsewhere classified: Secondary | ICD-10-CM | POA: Diagnosis not present

## 2021-08-26 DIAGNOSIS — M6281 Muscle weakness (generalized): Secondary | ICD-10-CM | POA: Diagnosis not present

## 2021-08-26 DIAGNOSIS — R262 Difficulty in walking, not elsewhere classified: Secondary | ICD-10-CM | POA: Diagnosis not present

## 2021-08-26 DIAGNOSIS — R278 Other lack of coordination: Secondary | ICD-10-CM | POA: Diagnosis not present

## 2021-08-27 DIAGNOSIS — M6281 Muscle weakness (generalized): Secondary | ICD-10-CM | POA: Diagnosis not present

## 2021-08-27 DIAGNOSIS — R278 Other lack of coordination: Secondary | ICD-10-CM | POA: Diagnosis not present

## 2021-08-27 DIAGNOSIS — R262 Difficulty in walking, not elsewhere classified: Secondary | ICD-10-CM | POA: Diagnosis not present

## 2021-08-28 DIAGNOSIS — M6281 Muscle weakness (generalized): Secondary | ICD-10-CM | POA: Diagnosis not present

## 2021-08-28 DIAGNOSIS — R262 Difficulty in walking, not elsewhere classified: Secondary | ICD-10-CM | POA: Diagnosis not present

## 2021-08-28 DIAGNOSIS — R278 Other lack of coordination: Secondary | ICD-10-CM | POA: Diagnosis not present

## 2021-08-31 DIAGNOSIS — R278 Other lack of coordination: Secondary | ICD-10-CM | POA: Diagnosis not present

## 2021-08-31 DIAGNOSIS — M6281 Muscle weakness (generalized): Secondary | ICD-10-CM | POA: Diagnosis not present

## 2021-08-31 DIAGNOSIS — R262 Difficulty in walking, not elsewhere classified: Secondary | ICD-10-CM | POA: Diagnosis not present

## 2021-09-01 DIAGNOSIS — R262 Difficulty in walking, not elsewhere classified: Secondary | ICD-10-CM | POA: Diagnosis not present

## 2021-09-01 DIAGNOSIS — M6281 Muscle weakness (generalized): Secondary | ICD-10-CM | POA: Diagnosis not present

## 2021-09-01 DIAGNOSIS — R278 Other lack of coordination: Secondary | ICD-10-CM | POA: Diagnosis not present

## 2021-09-02 DIAGNOSIS — M6281 Muscle weakness (generalized): Secondary | ICD-10-CM | POA: Diagnosis not present

## 2021-09-02 DIAGNOSIS — R262 Difficulty in walking, not elsewhere classified: Secondary | ICD-10-CM | POA: Diagnosis not present

## 2021-09-02 DIAGNOSIS — R278 Other lack of coordination: Secondary | ICD-10-CM | POA: Diagnosis not present

## 2021-09-03 DIAGNOSIS — M6281 Muscle weakness (generalized): Secondary | ICD-10-CM | POA: Diagnosis not present

## 2021-09-03 DIAGNOSIS — R278 Other lack of coordination: Secondary | ICD-10-CM | POA: Diagnosis not present

## 2021-09-03 DIAGNOSIS — R262 Difficulty in walking, not elsewhere classified: Secondary | ICD-10-CM | POA: Diagnosis not present

## 2021-09-04 DIAGNOSIS — M6281 Muscle weakness (generalized): Secondary | ICD-10-CM | POA: Diagnosis not present

## 2021-09-04 DIAGNOSIS — R278 Other lack of coordination: Secondary | ICD-10-CM | POA: Diagnosis not present

## 2021-09-04 DIAGNOSIS — R262 Difficulty in walking, not elsewhere classified: Secondary | ICD-10-CM | POA: Diagnosis not present

## 2021-09-07 DIAGNOSIS — R278 Other lack of coordination: Secondary | ICD-10-CM | POA: Diagnosis not present

## 2021-09-07 DIAGNOSIS — R262 Difficulty in walking, not elsewhere classified: Secondary | ICD-10-CM | POA: Diagnosis not present

## 2021-09-07 DIAGNOSIS — M6281 Muscle weakness (generalized): Secondary | ICD-10-CM | POA: Diagnosis not present

## 2021-09-08 DIAGNOSIS — R278 Other lack of coordination: Secondary | ICD-10-CM | POA: Diagnosis not present

## 2021-09-08 DIAGNOSIS — D509 Iron deficiency anemia, unspecified: Secondary | ICD-10-CM | POA: Diagnosis not present

## 2021-09-08 DIAGNOSIS — M6281 Muscle weakness (generalized): Secondary | ICD-10-CM | POA: Diagnosis not present

## 2021-09-08 DIAGNOSIS — R262 Difficulty in walking, not elsewhere classified: Secondary | ICD-10-CM | POA: Diagnosis not present

## 2021-09-09 DIAGNOSIS — R278 Other lack of coordination: Secondary | ICD-10-CM | POA: Diagnosis not present

## 2021-09-09 DIAGNOSIS — R262 Difficulty in walking, not elsewhere classified: Secondary | ICD-10-CM | POA: Diagnosis not present

## 2021-09-09 DIAGNOSIS — M6281 Muscle weakness (generalized): Secondary | ICD-10-CM | POA: Diagnosis not present

## 2021-09-10 DIAGNOSIS — R278 Other lack of coordination: Secondary | ICD-10-CM | POA: Diagnosis not present

## 2021-09-10 DIAGNOSIS — D509 Iron deficiency anemia, unspecified: Secondary | ICD-10-CM | POA: Diagnosis not present

## 2021-09-10 DIAGNOSIS — Z1331 Encounter for screening for depression: Secondary | ICD-10-CM | POA: Diagnosis not present

## 2021-09-10 DIAGNOSIS — K219 Gastro-esophageal reflux disease without esophagitis: Secondary | ICD-10-CM | POA: Diagnosis not present

## 2021-09-10 DIAGNOSIS — M6281 Muscle weakness (generalized): Secondary | ICD-10-CM | POA: Diagnosis not present

## 2021-09-10 DIAGNOSIS — I5040 Unspecified combined systolic (congestive) and diastolic (congestive) heart failure: Secondary | ICD-10-CM | POA: Diagnosis not present

## 2021-09-10 DIAGNOSIS — R262 Difficulty in walking, not elsewhere classified: Secondary | ICD-10-CM | POA: Diagnosis not present

## 2021-09-11 DIAGNOSIS — R262 Difficulty in walking, not elsewhere classified: Secondary | ICD-10-CM | POA: Diagnosis not present

## 2021-09-11 DIAGNOSIS — M6281 Muscle weakness (generalized): Secondary | ICD-10-CM | POA: Diagnosis not present

## 2021-09-11 DIAGNOSIS — R278 Other lack of coordination: Secondary | ICD-10-CM | POA: Diagnosis not present

## 2021-09-13 DIAGNOSIS — M6281 Muscle weakness (generalized): Secondary | ICD-10-CM | POA: Diagnosis not present

## 2021-09-13 DIAGNOSIS — R278 Other lack of coordination: Secondary | ICD-10-CM | POA: Diagnosis not present

## 2021-09-13 DIAGNOSIS — R262 Difficulty in walking, not elsewhere classified: Secondary | ICD-10-CM | POA: Diagnosis not present

## 2021-09-14 DIAGNOSIS — R262 Difficulty in walking, not elsewhere classified: Secondary | ICD-10-CM | POA: Diagnosis not present

## 2021-09-14 DIAGNOSIS — M6281 Muscle weakness (generalized): Secondary | ICD-10-CM | POA: Diagnosis not present

## 2021-09-14 DIAGNOSIS — R278 Other lack of coordination: Secondary | ICD-10-CM | POA: Diagnosis not present

## 2021-09-15 DIAGNOSIS — R278 Other lack of coordination: Secondary | ICD-10-CM | POA: Diagnosis not present

## 2021-09-15 DIAGNOSIS — R262 Difficulty in walking, not elsewhere classified: Secondary | ICD-10-CM | POA: Diagnosis not present

## 2021-09-15 DIAGNOSIS — M6281 Muscle weakness (generalized): Secondary | ICD-10-CM | POA: Diagnosis not present

## 2021-09-15 DIAGNOSIS — D509 Iron deficiency anemia, unspecified: Secondary | ICD-10-CM | POA: Diagnosis not present

## 2021-09-16 DIAGNOSIS — R278 Other lack of coordination: Secondary | ICD-10-CM | POA: Diagnosis not present

## 2021-09-16 DIAGNOSIS — R262 Difficulty in walking, not elsewhere classified: Secondary | ICD-10-CM | POA: Diagnosis not present

## 2021-09-16 DIAGNOSIS — M6281 Muscle weakness (generalized): Secondary | ICD-10-CM | POA: Diagnosis not present

## 2021-09-17 DIAGNOSIS — R262 Difficulty in walking, not elsewhere classified: Secondary | ICD-10-CM | POA: Diagnosis not present

## 2021-09-17 DIAGNOSIS — M6281 Muscle weakness (generalized): Secondary | ICD-10-CM | POA: Diagnosis not present

## 2021-09-17 DIAGNOSIS — R278 Other lack of coordination: Secondary | ICD-10-CM | POA: Diagnosis not present

## 2021-09-18 DIAGNOSIS — R262 Difficulty in walking, not elsewhere classified: Secondary | ICD-10-CM | POA: Diagnosis not present

## 2021-09-18 DIAGNOSIS — M6281 Muscle weakness (generalized): Secondary | ICD-10-CM | POA: Diagnosis not present

## 2021-09-18 DIAGNOSIS — R278 Other lack of coordination: Secondary | ICD-10-CM | POA: Diagnosis not present

## 2021-09-21 DIAGNOSIS — M6281 Muscle weakness (generalized): Secondary | ICD-10-CM | POA: Diagnosis not present

## 2021-09-21 DIAGNOSIS — R262 Difficulty in walking, not elsewhere classified: Secondary | ICD-10-CM | POA: Diagnosis not present

## 2021-09-21 DIAGNOSIS — R278 Other lack of coordination: Secondary | ICD-10-CM | POA: Diagnosis not present

## 2021-09-22 DIAGNOSIS — R278 Other lack of coordination: Secondary | ICD-10-CM | POA: Diagnosis not present

## 2021-09-22 DIAGNOSIS — R262 Difficulty in walking, not elsewhere classified: Secondary | ICD-10-CM | POA: Diagnosis not present

## 2021-09-22 DIAGNOSIS — M6281 Muscle weakness (generalized): Secondary | ICD-10-CM | POA: Diagnosis not present

## 2021-09-23 DIAGNOSIS — R262 Difficulty in walking, not elsewhere classified: Secondary | ICD-10-CM | POA: Diagnosis not present

## 2021-09-23 DIAGNOSIS — M6281 Muscle weakness (generalized): Secondary | ICD-10-CM | POA: Diagnosis not present

## 2021-09-23 DIAGNOSIS — R278 Other lack of coordination: Secondary | ICD-10-CM | POA: Diagnosis not present

## 2021-09-24 DIAGNOSIS — R262 Difficulty in walking, not elsewhere classified: Secondary | ICD-10-CM | POA: Diagnosis not present

## 2021-09-24 DIAGNOSIS — R278 Other lack of coordination: Secondary | ICD-10-CM | POA: Diagnosis not present

## 2021-09-24 DIAGNOSIS — M6281 Muscle weakness (generalized): Secondary | ICD-10-CM | POA: Diagnosis not present

## 2021-09-25 DIAGNOSIS — R278 Other lack of coordination: Secondary | ICD-10-CM | POA: Diagnosis not present

## 2021-09-25 DIAGNOSIS — M6281 Muscle weakness (generalized): Secondary | ICD-10-CM | POA: Diagnosis not present

## 2021-09-25 DIAGNOSIS — R262 Difficulty in walking, not elsewhere classified: Secondary | ICD-10-CM | POA: Diagnosis not present

## 2021-09-28 DIAGNOSIS — R262 Difficulty in walking, not elsewhere classified: Secondary | ICD-10-CM | POA: Diagnosis not present

## 2021-09-28 DIAGNOSIS — M6281 Muscle weakness (generalized): Secondary | ICD-10-CM | POA: Diagnosis not present

## 2021-09-28 DIAGNOSIS — R278 Other lack of coordination: Secondary | ICD-10-CM | POA: Diagnosis not present

## 2021-09-29 DIAGNOSIS — R262 Difficulty in walking, not elsewhere classified: Secondary | ICD-10-CM | POA: Diagnosis not present

## 2021-09-29 DIAGNOSIS — R278 Other lack of coordination: Secondary | ICD-10-CM | POA: Diagnosis not present

## 2021-09-29 DIAGNOSIS — M6281 Muscle weakness (generalized): Secondary | ICD-10-CM | POA: Diagnosis not present

## 2021-09-30 DIAGNOSIS — R262 Difficulty in walking, not elsewhere classified: Secondary | ICD-10-CM | POA: Diagnosis not present

## 2021-09-30 DIAGNOSIS — R278 Other lack of coordination: Secondary | ICD-10-CM | POA: Diagnosis not present

## 2021-09-30 DIAGNOSIS — M6281 Muscle weakness (generalized): Secondary | ICD-10-CM | POA: Diagnosis not present

## 2021-10-01 DIAGNOSIS — R278 Other lack of coordination: Secondary | ICD-10-CM | POA: Diagnosis not present

## 2021-10-01 DIAGNOSIS — R262 Difficulty in walking, not elsewhere classified: Secondary | ICD-10-CM | POA: Diagnosis not present

## 2021-10-01 DIAGNOSIS — M6281 Muscle weakness (generalized): Secondary | ICD-10-CM | POA: Diagnosis not present

## 2021-10-02 DIAGNOSIS — M6281 Muscle weakness (generalized): Secondary | ICD-10-CM | POA: Diagnosis not present

## 2021-10-02 DIAGNOSIS — R262 Difficulty in walking, not elsewhere classified: Secondary | ICD-10-CM | POA: Diagnosis not present

## 2021-10-02 DIAGNOSIS — R278 Other lack of coordination: Secondary | ICD-10-CM | POA: Diagnosis not present

## 2021-10-05 DIAGNOSIS — R278 Other lack of coordination: Secondary | ICD-10-CM | POA: Diagnosis not present

## 2021-10-05 DIAGNOSIS — M6281 Muscle weakness (generalized): Secondary | ICD-10-CM | POA: Diagnosis not present

## 2021-10-05 DIAGNOSIS — R262 Difficulty in walking, not elsewhere classified: Secondary | ICD-10-CM | POA: Diagnosis not present

## 2021-10-06 DIAGNOSIS — M6281 Muscle weakness (generalized): Secondary | ICD-10-CM | POA: Diagnosis not present

## 2021-10-06 DIAGNOSIS — R262 Difficulty in walking, not elsewhere classified: Secondary | ICD-10-CM | POA: Diagnosis not present

## 2021-10-06 DIAGNOSIS — R278 Other lack of coordination: Secondary | ICD-10-CM | POA: Diagnosis not present

## 2021-10-07 DIAGNOSIS — R262 Difficulty in walking, not elsewhere classified: Secondary | ICD-10-CM | POA: Diagnosis not present

## 2021-10-07 DIAGNOSIS — R278 Other lack of coordination: Secondary | ICD-10-CM | POA: Diagnosis not present

## 2021-10-07 DIAGNOSIS — M6281 Muscle weakness (generalized): Secondary | ICD-10-CM | POA: Diagnosis not present

## 2021-10-08 DIAGNOSIS — R278 Other lack of coordination: Secondary | ICD-10-CM | POA: Diagnosis not present

## 2021-10-08 DIAGNOSIS — M6281 Muscle weakness (generalized): Secondary | ICD-10-CM | POA: Diagnosis not present

## 2021-10-08 DIAGNOSIS — R262 Difficulty in walking, not elsewhere classified: Secondary | ICD-10-CM | POA: Diagnosis not present

## 2021-10-09 DIAGNOSIS — R278 Other lack of coordination: Secondary | ICD-10-CM | POA: Diagnosis not present

## 2021-10-09 DIAGNOSIS — M6281 Muscle weakness (generalized): Secondary | ICD-10-CM | POA: Diagnosis not present

## 2021-10-09 DIAGNOSIS — R262 Difficulty in walking, not elsewhere classified: Secondary | ICD-10-CM | POA: Diagnosis not present

## 2021-10-12 DIAGNOSIS — M6281 Muscle weakness (generalized): Secondary | ICD-10-CM | POA: Diagnosis not present

## 2021-10-12 DIAGNOSIS — R278 Other lack of coordination: Secondary | ICD-10-CM | POA: Diagnosis not present

## 2021-10-12 DIAGNOSIS — R262 Difficulty in walking, not elsewhere classified: Secondary | ICD-10-CM | POA: Diagnosis not present

## 2021-10-13 DIAGNOSIS — M6281 Muscle weakness (generalized): Secondary | ICD-10-CM | POA: Diagnosis not present

## 2021-10-13 DIAGNOSIS — R278 Other lack of coordination: Secondary | ICD-10-CM | POA: Diagnosis not present

## 2021-10-13 DIAGNOSIS — R262 Difficulty in walking, not elsewhere classified: Secondary | ICD-10-CM | POA: Diagnosis not present

## 2021-10-14 DIAGNOSIS — R262 Difficulty in walking, not elsewhere classified: Secondary | ICD-10-CM | POA: Diagnosis not present

## 2021-10-14 DIAGNOSIS — M6281 Muscle weakness (generalized): Secondary | ICD-10-CM | POA: Diagnosis not present

## 2021-10-14 DIAGNOSIS — R278 Other lack of coordination: Secondary | ICD-10-CM | POA: Diagnosis not present

## 2021-10-15 DIAGNOSIS — M6281 Muscle weakness (generalized): Secondary | ICD-10-CM | POA: Diagnosis not present

## 2021-10-15 DIAGNOSIS — R262 Difficulty in walking, not elsewhere classified: Secondary | ICD-10-CM | POA: Diagnosis not present

## 2021-10-15 DIAGNOSIS — R278 Other lack of coordination: Secondary | ICD-10-CM | POA: Diagnosis not present

## 2021-10-16 DIAGNOSIS — M6281 Muscle weakness (generalized): Secondary | ICD-10-CM | POA: Diagnosis not present

## 2021-10-16 DIAGNOSIS — R278 Other lack of coordination: Secondary | ICD-10-CM | POA: Diagnosis not present

## 2021-10-16 DIAGNOSIS — R262 Difficulty in walking, not elsewhere classified: Secondary | ICD-10-CM | POA: Diagnosis not present

## 2021-10-18 DIAGNOSIS — M6281 Muscle weakness (generalized): Secondary | ICD-10-CM | POA: Diagnosis not present

## 2021-10-18 DIAGNOSIS — R278 Other lack of coordination: Secondary | ICD-10-CM | POA: Diagnosis not present

## 2021-10-18 DIAGNOSIS — R262 Difficulty in walking, not elsewhere classified: Secondary | ICD-10-CM | POA: Diagnosis not present

## 2021-10-19 DIAGNOSIS — R278 Other lack of coordination: Secondary | ICD-10-CM | POA: Diagnosis not present

## 2021-10-19 DIAGNOSIS — M6281 Muscle weakness (generalized): Secondary | ICD-10-CM | POA: Diagnosis not present

## 2021-10-19 DIAGNOSIS — R262 Difficulty in walking, not elsewhere classified: Secondary | ICD-10-CM | POA: Diagnosis not present

## 2021-10-20 DIAGNOSIS — R278 Other lack of coordination: Secondary | ICD-10-CM | POA: Diagnosis not present

## 2021-10-20 DIAGNOSIS — R262 Difficulty in walking, not elsewhere classified: Secondary | ICD-10-CM | POA: Diagnosis not present

## 2021-10-20 DIAGNOSIS — M6281 Muscle weakness (generalized): Secondary | ICD-10-CM | POA: Diagnosis not present

## 2021-10-21 DIAGNOSIS — R278 Other lack of coordination: Secondary | ICD-10-CM | POA: Diagnosis not present

## 2021-10-21 DIAGNOSIS — R262 Difficulty in walking, not elsewhere classified: Secondary | ICD-10-CM | POA: Diagnosis not present

## 2021-10-21 DIAGNOSIS — M6281 Muscle weakness (generalized): Secondary | ICD-10-CM | POA: Diagnosis not present

## 2021-10-22 DIAGNOSIS — M6281 Muscle weakness (generalized): Secondary | ICD-10-CM | POA: Diagnosis not present

## 2021-10-22 DIAGNOSIS — R262 Difficulty in walking, not elsewhere classified: Secondary | ICD-10-CM | POA: Diagnosis not present

## 2021-10-22 DIAGNOSIS — R278 Other lack of coordination: Secondary | ICD-10-CM | POA: Diagnosis not present

## 2021-10-23 DIAGNOSIS — R262 Difficulty in walking, not elsewhere classified: Secondary | ICD-10-CM | POA: Diagnosis not present

## 2021-10-23 DIAGNOSIS — R278 Other lack of coordination: Secondary | ICD-10-CM | POA: Diagnosis not present

## 2021-10-23 DIAGNOSIS — M6281 Muscle weakness (generalized): Secondary | ICD-10-CM | POA: Diagnosis not present

## 2021-10-26 DIAGNOSIS — R278 Other lack of coordination: Secondary | ICD-10-CM | POA: Diagnosis not present

## 2021-10-26 DIAGNOSIS — R262 Difficulty in walking, not elsewhere classified: Secondary | ICD-10-CM | POA: Diagnosis not present

## 2021-10-26 DIAGNOSIS — M6281 Muscle weakness (generalized): Secondary | ICD-10-CM | POA: Diagnosis not present

## 2021-10-27 DIAGNOSIS — R278 Other lack of coordination: Secondary | ICD-10-CM | POA: Diagnosis not present

## 2021-10-27 DIAGNOSIS — R262 Difficulty in walking, not elsewhere classified: Secondary | ICD-10-CM | POA: Diagnosis not present

## 2021-10-27 DIAGNOSIS — M6281 Muscle weakness (generalized): Secondary | ICD-10-CM | POA: Diagnosis not present

## 2021-10-28 DIAGNOSIS — R262 Difficulty in walking, not elsewhere classified: Secondary | ICD-10-CM | POA: Diagnosis not present

## 2021-10-28 DIAGNOSIS — M6281 Muscle weakness (generalized): Secondary | ICD-10-CM | POA: Diagnosis not present

## 2021-10-28 DIAGNOSIS — R278 Other lack of coordination: Secondary | ICD-10-CM | POA: Diagnosis not present

## 2021-10-29 DIAGNOSIS — M6281 Muscle weakness (generalized): Secondary | ICD-10-CM | POA: Diagnosis not present

## 2021-10-29 DIAGNOSIS — R262 Difficulty in walking, not elsewhere classified: Secondary | ICD-10-CM | POA: Diagnosis not present

## 2021-10-29 DIAGNOSIS — R278 Other lack of coordination: Secondary | ICD-10-CM | POA: Diagnosis not present

## 2021-10-30 DIAGNOSIS — M6281 Muscle weakness (generalized): Secondary | ICD-10-CM | POA: Diagnosis not present

## 2021-10-30 DIAGNOSIS — R262 Difficulty in walking, not elsewhere classified: Secondary | ICD-10-CM | POA: Diagnosis not present

## 2021-10-30 DIAGNOSIS — R278 Other lack of coordination: Secondary | ICD-10-CM | POA: Diagnosis not present

## 2021-11-02 DIAGNOSIS — R262 Difficulty in walking, not elsewhere classified: Secondary | ICD-10-CM | POA: Diagnosis not present

## 2021-11-02 DIAGNOSIS — M6281 Muscle weakness (generalized): Secondary | ICD-10-CM | POA: Diagnosis not present

## 2021-11-02 DIAGNOSIS — R278 Other lack of coordination: Secondary | ICD-10-CM | POA: Diagnosis not present

## 2021-11-03 DIAGNOSIS — R262 Difficulty in walking, not elsewhere classified: Secondary | ICD-10-CM | POA: Diagnosis not present

## 2021-11-03 DIAGNOSIS — M6281 Muscle weakness (generalized): Secondary | ICD-10-CM | POA: Diagnosis not present

## 2021-11-03 DIAGNOSIS — R278 Other lack of coordination: Secondary | ICD-10-CM | POA: Diagnosis not present

## 2021-11-04 DIAGNOSIS — R278 Other lack of coordination: Secondary | ICD-10-CM | POA: Diagnosis not present

## 2021-11-04 DIAGNOSIS — M6281 Muscle weakness (generalized): Secondary | ICD-10-CM | POA: Diagnosis not present

## 2021-11-04 DIAGNOSIS — R262 Difficulty in walking, not elsewhere classified: Secondary | ICD-10-CM | POA: Diagnosis not present

## 2021-11-05 DIAGNOSIS — R278 Other lack of coordination: Secondary | ICD-10-CM | POA: Diagnosis not present

## 2021-11-05 DIAGNOSIS — R262 Difficulty in walking, not elsewhere classified: Secondary | ICD-10-CM | POA: Diagnosis not present

## 2021-11-05 DIAGNOSIS — M6281 Muscle weakness (generalized): Secondary | ICD-10-CM | POA: Diagnosis not present

## 2021-11-07 DIAGNOSIS — R262 Difficulty in walking, not elsewhere classified: Secondary | ICD-10-CM | POA: Diagnosis not present

## 2021-11-07 DIAGNOSIS — M6281 Muscle weakness (generalized): Secondary | ICD-10-CM | POA: Diagnosis not present

## 2021-11-07 DIAGNOSIS — R278 Other lack of coordination: Secondary | ICD-10-CM | POA: Diagnosis not present

## 2021-11-08 DIAGNOSIS — R262 Difficulty in walking, not elsewhere classified: Secondary | ICD-10-CM | POA: Diagnosis not present

## 2021-11-08 DIAGNOSIS — R278 Other lack of coordination: Secondary | ICD-10-CM | POA: Diagnosis not present

## 2021-11-08 DIAGNOSIS — M6281 Muscle weakness (generalized): Secondary | ICD-10-CM | POA: Diagnosis not present

## 2021-11-09 DIAGNOSIS — R262 Difficulty in walking, not elsewhere classified: Secondary | ICD-10-CM | POA: Diagnosis not present

## 2021-11-09 DIAGNOSIS — M6281 Muscle weakness (generalized): Secondary | ICD-10-CM | POA: Diagnosis not present

## 2021-11-09 DIAGNOSIS — R278 Other lack of coordination: Secondary | ICD-10-CM | POA: Diagnosis not present

## 2021-11-10 DIAGNOSIS — R278 Other lack of coordination: Secondary | ICD-10-CM | POA: Diagnosis not present

## 2021-11-10 DIAGNOSIS — M6281 Muscle weakness (generalized): Secondary | ICD-10-CM | POA: Diagnosis not present

## 2021-11-10 DIAGNOSIS — R262 Difficulty in walking, not elsewhere classified: Secondary | ICD-10-CM | POA: Diagnosis not present

## 2021-11-11 DIAGNOSIS — M6281 Muscle weakness (generalized): Secondary | ICD-10-CM | POA: Diagnosis not present

## 2021-11-11 DIAGNOSIS — R262 Difficulty in walking, not elsewhere classified: Secondary | ICD-10-CM | POA: Diagnosis not present

## 2021-11-11 DIAGNOSIS — R278 Other lack of coordination: Secondary | ICD-10-CM | POA: Diagnosis not present

## 2021-11-12 DIAGNOSIS — R278 Other lack of coordination: Secondary | ICD-10-CM | POA: Diagnosis not present

## 2021-11-12 DIAGNOSIS — R262 Difficulty in walking, not elsewhere classified: Secondary | ICD-10-CM | POA: Diagnosis not present

## 2021-11-12 DIAGNOSIS — M6281 Muscle weakness (generalized): Secondary | ICD-10-CM | POA: Diagnosis not present

## 2021-11-13 DIAGNOSIS — R278 Other lack of coordination: Secondary | ICD-10-CM | POA: Diagnosis not present

## 2021-11-13 DIAGNOSIS — M6281 Muscle weakness (generalized): Secondary | ICD-10-CM | POA: Diagnosis not present

## 2021-11-13 DIAGNOSIS — R262 Difficulty in walking, not elsewhere classified: Secondary | ICD-10-CM | POA: Diagnosis not present

## 2021-11-16 DIAGNOSIS — R262 Difficulty in walking, not elsewhere classified: Secondary | ICD-10-CM | POA: Diagnosis not present

## 2021-11-16 DIAGNOSIS — R278 Other lack of coordination: Secondary | ICD-10-CM | POA: Diagnosis not present

## 2021-11-16 DIAGNOSIS — M6281 Muscle weakness (generalized): Secondary | ICD-10-CM | POA: Diagnosis not present

## 2021-11-17 DIAGNOSIS — R262 Difficulty in walking, not elsewhere classified: Secondary | ICD-10-CM | POA: Diagnosis not present

## 2021-11-18 DIAGNOSIS — R262 Difficulty in walking, not elsewhere classified: Secondary | ICD-10-CM | POA: Diagnosis not present

## 2021-11-19 DIAGNOSIS — R262 Difficulty in walking, not elsewhere classified: Secondary | ICD-10-CM | POA: Diagnosis not present

## 2021-11-20 DIAGNOSIS — R262 Difficulty in walking, not elsewhere classified: Secondary | ICD-10-CM | POA: Diagnosis not present

## 2021-11-22 DIAGNOSIS — R262 Difficulty in walking, not elsewhere classified: Secondary | ICD-10-CM | POA: Diagnosis not present

## 2021-11-23 DIAGNOSIS — R262 Difficulty in walking, not elsewhere classified: Secondary | ICD-10-CM | POA: Diagnosis not present

## 2021-11-24 DIAGNOSIS — R262 Difficulty in walking, not elsewhere classified: Secondary | ICD-10-CM | POA: Diagnosis not present

## 2021-11-25 DIAGNOSIS — R262 Difficulty in walking, not elsewhere classified: Secondary | ICD-10-CM | POA: Diagnosis not present

## 2021-11-26 DIAGNOSIS — R262 Difficulty in walking, not elsewhere classified: Secondary | ICD-10-CM | POA: Diagnosis not present

## 2021-11-27 DIAGNOSIS — R262 Difficulty in walking, not elsewhere classified: Secondary | ICD-10-CM | POA: Diagnosis not present

## 2021-12-21 DIAGNOSIS — R1312 Dysphagia, oropharyngeal phase: Secondary | ICD-10-CM | POA: Diagnosis not present

## 2021-12-24 DIAGNOSIS — R1312 Dysphagia, oropharyngeal phase: Secondary | ICD-10-CM | POA: Diagnosis not present

## 2021-12-25 DIAGNOSIS — R1312 Dysphagia, oropharyngeal phase: Secondary | ICD-10-CM | POA: Diagnosis not present

## 2021-12-28 DIAGNOSIS — R1312 Dysphagia, oropharyngeal phase: Secondary | ICD-10-CM | POA: Diagnosis not present

## 2022-02-01 DIAGNOSIS — R262 Difficulty in walking, not elsewhere classified: Secondary | ICD-10-CM | POA: Diagnosis not present

## 2022-02-01 DIAGNOSIS — R278 Other lack of coordination: Secondary | ICD-10-CM | POA: Diagnosis not present

## 2022-02-01 DIAGNOSIS — M6281 Muscle weakness (generalized): Secondary | ICD-10-CM | POA: Diagnosis not present

## 2022-02-02 DIAGNOSIS — R262 Difficulty in walking, not elsewhere classified: Secondary | ICD-10-CM | POA: Diagnosis not present

## 2022-02-02 DIAGNOSIS — R278 Other lack of coordination: Secondary | ICD-10-CM | POA: Diagnosis not present

## 2022-02-02 DIAGNOSIS — M6281 Muscle weakness (generalized): Secondary | ICD-10-CM | POA: Diagnosis not present

## 2022-02-03 DIAGNOSIS — M6281 Muscle weakness (generalized): Secondary | ICD-10-CM | POA: Diagnosis not present

## 2022-02-03 DIAGNOSIS — R262 Difficulty in walking, not elsewhere classified: Secondary | ICD-10-CM | POA: Diagnosis not present

## 2022-02-03 DIAGNOSIS — R278 Other lack of coordination: Secondary | ICD-10-CM | POA: Diagnosis not present

## 2022-02-04 DIAGNOSIS — R262 Difficulty in walking, not elsewhere classified: Secondary | ICD-10-CM | POA: Diagnosis not present

## 2022-02-04 DIAGNOSIS — R278 Other lack of coordination: Secondary | ICD-10-CM | POA: Diagnosis not present

## 2022-02-04 DIAGNOSIS — M6281 Muscle weakness (generalized): Secondary | ICD-10-CM | POA: Diagnosis not present

## 2022-02-05 DIAGNOSIS — R262 Difficulty in walking, not elsewhere classified: Secondary | ICD-10-CM | POA: Diagnosis not present

## 2022-02-05 DIAGNOSIS — R278 Other lack of coordination: Secondary | ICD-10-CM | POA: Diagnosis not present

## 2022-02-05 DIAGNOSIS — M6281 Muscle weakness (generalized): Secondary | ICD-10-CM | POA: Diagnosis not present

## 2022-02-07 DIAGNOSIS — R262 Difficulty in walking, not elsewhere classified: Secondary | ICD-10-CM | POA: Diagnosis not present

## 2022-02-07 DIAGNOSIS — R278 Other lack of coordination: Secondary | ICD-10-CM | POA: Diagnosis not present

## 2022-02-07 DIAGNOSIS — M6281 Muscle weakness (generalized): Secondary | ICD-10-CM | POA: Diagnosis not present

## 2022-02-08 DIAGNOSIS — M6281 Muscle weakness (generalized): Secondary | ICD-10-CM | POA: Diagnosis not present

## 2022-02-08 DIAGNOSIS — R262 Difficulty in walking, not elsewhere classified: Secondary | ICD-10-CM | POA: Diagnosis not present

## 2022-02-08 DIAGNOSIS — R278 Other lack of coordination: Secondary | ICD-10-CM | POA: Diagnosis not present

## 2022-02-09 DIAGNOSIS — R278 Other lack of coordination: Secondary | ICD-10-CM | POA: Diagnosis not present

## 2022-02-09 DIAGNOSIS — R262 Difficulty in walking, not elsewhere classified: Secondary | ICD-10-CM | POA: Diagnosis not present

## 2022-02-09 DIAGNOSIS — M6281 Muscle weakness (generalized): Secondary | ICD-10-CM | POA: Diagnosis not present

## 2022-02-10 DIAGNOSIS — M6281 Muscle weakness (generalized): Secondary | ICD-10-CM | POA: Diagnosis not present

## 2022-02-10 DIAGNOSIS — R262 Difficulty in walking, not elsewhere classified: Secondary | ICD-10-CM | POA: Diagnosis not present

## 2022-02-10 DIAGNOSIS — R278 Other lack of coordination: Secondary | ICD-10-CM | POA: Diagnosis not present

## 2022-02-11 DIAGNOSIS — R278 Other lack of coordination: Secondary | ICD-10-CM | POA: Diagnosis not present

## 2022-02-11 DIAGNOSIS — R262 Difficulty in walking, not elsewhere classified: Secondary | ICD-10-CM | POA: Diagnosis not present

## 2022-02-11 DIAGNOSIS — M6281 Muscle weakness (generalized): Secondary | ICD-10-CM | POA: Diagnosis not present

## 2022-02-12 DIAGNOSIS — M6281 Muscle weakness (generalized): Secondary | ICD-10-CM | POA: Diagnosis not present

## 2022-02-12 DIAGNOSIS — R278 Other lack of coordination: Secondary | ICD-10-CM | POA: Diagnosis not present

## 2022-02-12 DIAGNOSIS — R262 Difficulty in walking, not elsewhere classified: Secondary | ICD-10-CM | POA: Diagnosis not present

## 2022-02-15 DIAGNOSIS — R278 Other lack of coordination: Secondary | ICD-10-CM | POA: Diagnosis not present

## 2022-02-15 DIAGNOSIS — R262 Difficulty in walking, not elsewhere classified: Secondary | ICD-10-CM | POA: Diagnosis not present

## 2022-02-15 DIAGNOSIS — M6281 Muscle weakness (generalized): Secondary | ICD-10-CM | POA: Diagnosis not present

## 2022-02-16 DIAGNOSIS — M6281 Muscle weakness (generalized): Secondary | ICD-10-CM | POA: Diagnosis not present

## 2022-02-16 DIAGNOSIS — R262 Difficulty in walking, not elsewhere classified: Secondary | ICD-10-CM | POA: Diagnosis not present

## 2022-02-16 DIAGNOSIS — R278 Other lack of coordination: Secondary | ICD-10-CM | POA: Diagnosis not present

## 2022-02-17 DIAGNOSIS — R262 Difficulty in walking, not elsewhere classified: Secondary | ICD-10-CM | POA: Diagnosis not present

## 2022-02-17 DIAGNOSIS — R278 Other lack of coordination: Secondary | ICD-10-CM | POA: Diagnosis not present

## 2022-02-17 DIAGNOSIS — M6281 Muscle weakness (generalized): Secondary | ICD-10-CM | POA: Diagnosis not present

## 2022-02-18 DIAGNOSIS — R262 Difficulty in walking, not elsewhere classified: Secondary | ICD-10-CM | POA: Diagnosis not present

## 2022-02-18 DIAGNOSIS — R278 Other lack of coordination: Secondary | ICD-10-CM | POA: Diagnosis not present

## 2022-02-18 DIAGNOSIS — M6281 Muscle weakness (generalized): Secondary | ICD-10-CM | POA: Diagnosis not present

## 2022-02-19 DIAGNOSIS — R278 Other lack of coordination: Secondary | ICD-10-CM | POA: Diagnosis not present

## 2022-02-19 DIAGNOSIS — R262 Difficulty in walking, not elsewhere classified: Secondary | ICD-10-CM | POA: Diagnosis not present

## 2022-02-19 DIAGNOSIS — M6281 Muscle weakness (generalized): Secondary | ICD-10-CM | POA: Diagnosis not present

## 2022-02-22 DIAGNOSIS — R262 Difficulty in walking, not elsewhere classified: Secondary | ICD-10-CM | POA: Diagnosis not present

## 2022-02-22 DIAGNOSIS — M6281 Muscle weakness (generalized): Secondary | ICD-10-CM | POA: Diagnosis not present

## 2022-02-22 DIAGNOSIS — R278 Other lack of coordination: Secondary | ICD-10-CM | POA: Diagnosis not present

## 2022-02-23 DIAGNOSIS — R278 Other lack of coordination: Secondary | ICD-10-CM | POA: Diagnosis not present

## 2022-02-23 DIAGNOSIS — R262 Difficulty in walking, not elsewhere classified: Secondary | ICD-10-CM | POA: Diagnosis not present

## 2022-02-23 DIAGNOSIS — M6281 Muscle weakness (generalized): Secondary | ICD-10-CM | POA: Diagnosis not present

## 2022-02-24 DIAGNOSIS — M6281 Muscle weakness (generalized): Secondary | ICD-10-CM | POA: Diagnosis not present

## 2022-02-24 DIAGNOSIS — R278 Other lack of coordination: Secondary | ICD-10-CM | POA: Diagnosis not present

## 2022-02-24 DIAGNOSIS — R262 Difficulty in walking, not elsewhere classified: Secondary | ICD-10-CM | POA: Diagnosis not present

## 2022-03-01 DIAGNOSIS — R278 Other lack of coordination: Secondary | ICD-10-CM | POA: Diagnosis not present

## 2022-03-01 DIAGNOSIS — R262 Difficulty in walking, not elsewhere classified: Secondary | ICD-10-CM | POA: Diagnosis not present

## 2022-03-01 DIAGNOSIS — M6281 Muscle weakness (generalized): Secondary | ICD-10-CM | POA: Diagnosis not present

## 2022-03-02 DIAGNOSIS — M6281 Muscle weakness (generalized): Secondary | ICD-10-CM | POA: Diagnosis not present

## 2022-03-02 DIAGNOSIS — R278 Other lack of coordination: Secondary | ICD-10-CM | POA: Diagnosis not present

## 2022-03-02 DIAGNOSIS — R262 Difficulty in walking, not elsewhere classified: Secondary | ICD-10-CM | POA: Diagnosis not present

## 2022-03-03 DIAGNOSIS — R278 Other lack of coordination: Secondary | ICD-10-CM | POA: Diagnosis not present

## 2022-03-03 DIAGNOSIS — R262 Difficulty in walking, not elsewhere classified: Secondary | ICD-10-CM | POA: Diagnosis not present

## 2022-03-03 DIAGNOSIS — M6281 Muscle weakness (generalized): Secondary | ICD-10-CM | POA: Diagnosis not present

## 2022-03-04 DIAGNOSIS — M6281 Muscle weakness (generalized): Secondary | ICD-10-CM | POA: Diagnosis not present

## 2022-03-04 DIAGNOSIS — R278 Other lack of coordination: Secondary | ICD-10-CM | POA: Diagnosis not present

## 2022-03-04 DIAGNOSIS — R262 Difficulty in walking, not elsewhere classified: Secondary | ICD-10-CM | POA: Diagnosis not present

## 2022-03-05 DIAGNOSIS — R262 Difficulty in walking, not elsewhere classified: Secondary | ICD-10-CM | POA: Diagnosis not present

## 2022-03-05 DIAGNOSIS — R278 Other lack of coordination: Secondary | ICD-10-CM | POA: Diagnosis not present

## 2022-03-05 DIAGNOSIS — M6281 Muscle weakness (generalized): Secondary | ICD-10-CM | POA: Diagnosis not present

## 2022-03-08 DIAGNOSIS — R262 Difficulty in walking, not elsewhere classified: Secondary | ICD-10-CM | POA: Diagnosis not present

## 2022-03-08 DIAGNOSIS — R278 Other lack of coordination: Secondary | ICD-10-CM | POA: Diagnosis not present

## 2022-03-08 DIAGNOSIS — M6281 Muscle weakness (generalized): Secondary | ICD-10-CM | POA: Diagnosis not present

## 2022-03-09 DIAGNOSIS — R278 Other lack of coordination: Secondary | ICD-10-CM | POA: Diagnosis not present

## 2022-03-09 DIAGNOSIS — M6281 Muscle weakness (generalized): Secondary | ICD-10-CM | POA: Diagnosis not present

## 2022-03-09 DIAGNOSIS — R262 Difficulty in walking, not elsewhere classified: Secondary | ICD-10-CM | POA: Diagnosis not present

## 2022-03-10 DIAGNOSIS — R262 Difficulty in walking, not elsewhere classified: Secondary | ICD-10-CM | POA: Diagnosis not present

## 2022-03-10 DIAGNOSIS — M6281 Muscle weakness (generalized): Secondary | ICD-10-CM | POA: Diagnosis not present

## 2022-03-10 DIAGNOSIS — R278 Other lack of coordination: Secondary | ICD-10-CM | POA: Diagnosis not present

## 2022-04-01 DIAGNOSIS — J449 Chronic obstructive pulmonary disease, unspecified: Secondary | ICD-10-CM | POA: Insufficient documentation

## 2022-04-01 DIAGNOSIS — J209 Acute bronchitis, unspecified: Secondary | ICD-10-CM | POA: Insufficient documentation

## 2022-04-01 DIAGNOSIS — S2249XA Multiple fractures of ribs, unspecified side, initial encounter for closed fracture: Secondary | ICD-10-CM | POA: Insufficient documentation

## 2022-04-01 DIAGNOSIS — J9621 Acute and chronic respiratory failure with hypoxia: Secondary | ICD-10-CM | POA: Insufficient documentation

## 2022-04-01 DIAGNOSIS — D649 Anemia, unspecified: Secondary | ICD-10-CM | POA: Insufficient documentation

## 2022-04-09 DIAGNOSIS — J121 Respiratory syncytial virus pneumonia: Secondary | ICD-10-CM | POA: Insufficient documentation

## 2022-04-19 DEATH — deceased

## 2022-04-23 ENCOUNTER — Other Ambulatory Visit: Payer: Self-pay

## 2022-04-23 DIAGNOSIS — I10 Essential (primary) hypertension: Secondary | ICD-10-CM | POA: Insufficient documentation

## 2022-04-23 DIAGNOSIS — Z6834 Body mass index (BMI) 34.0-34.9, adult: Secondary | ICD-10-CM | POA: Insufficient documentation

## 2022-04-23 DIAGNOSIS — Z6835 Body mass index (BMI) 35.0-35.9, adult: Secondary | ICD-10-CM | POA: Insufficient documentation

## 2022-04-28 ENCOUNTER — Ambulatory Visit: Payer: Medicare PPO | Attending: Cardiology | Admitting: Cardiology
